# Patient Record
Sex: Female | Born: 1985 | Race: White | Hispanic: No | Smoking: Former smoker
Health system: Southern US, Community
[De-identification: ages and names within clinical notes are randomized; demographics above are authoritative.]

## PROBLEM LIST (undated history)

## (undated) DIAGNOSIS — K5150 Left sided colitis without complications: Secondary | ICD-10-CM

## (undated) DIAGNOSIS — F32A Depression, unspecified: Secondary | ICD-10-CM

## (undated) DIAGNOSIS — F419 Anxiety disorder, unspecified: Secondary | ICD-10-CM

## (undated) DIAGNOSIS — F1910 Other psychoactive substance abuse, uncomplicated: Secondary | ICD-10-CM

## (undated) DIAGNOSIS — K529 Noninfective gastroenteritis and colitis, unspecified: Secondary | ICD-10-CM

## (undated) DIAGNOSIS — F329 Major depressive disorder, single episode, unspecified: Secondary | ICD-10-CM

## (undated) DIAGNOSIS — B1920 Unspecified viral hepatitis C without hepatic coma: Secondary | ICD-10-CM

## (undated) HISTORY — DX: Depression, unspecified: F32.A

## (undated) HISTORY — DX: Left sided colitis without complications: K51.50

## (undated) HISTORY — DX: Anxiety disorder, unspecified: F41.9

## (undated) HISTORY — DX: Major depressive disorder, single episode, unspecified: F32.9

---

## 2014-02-24 ENCOUNTER — Encounter (HOSPITAL_COMMUNITY): Payer: Self-pay | Admitting: *Deleted

## 2014-02-24 ENCOUNTER — Emergency Department (HOSPITAL_COMMUNITY)
Admission: EM | Admit: 2014-02-24 | Discharge: 2014-02-24 | Disposition: A | Discharge status: Home / Self Care | Payer: No Typology Code available for payment source | Attending: Emergency Medicine | Admitting: Emergency Medicine

## 2014-02-24 ENCOUNTER — Emergency Department (HOSPITAL_COMMUNITY): Payer: No Typology Code available for payment source

## 2014-02-24 DIAGNOSIS — S99922A Unspecified injury of left foot, initial encounter: Secondary | ICD-10-CM | POA: Diagnosis present

## 2014-02-24 DIAGNOSIS — Y92410 Unspecified street and highway as the place of occurrence of the external cause: Secondary | ICD-10-CM | POA: Insufficient documentation

## 2014-02-24 DIAGNOSIS — Z3202 Encounter for pregnancy test, result negative: Secondary | ICD-10-CM | POA: Diagnosis not present

## 2014-02-24 DIAGNOSIS — S199XXA Unspecified injury of neck, initial encounter: Secondary | ICD-10-CM | POA: Insufficient documentation

## 2014-02-24 DIAGNOSIS — R451 Restlessness and agitation: Secondary | ICD-10-CM | POA: Diagnosis not present

## 2014-02-24 DIAGNOSIS — Y9389 Activity, other specified: Secondary | ICD-10-CM | POA: Insufficient documentation

## 2014-02-24 DIAGNOSIS — Z720 Tobacco use: Secondary | ICD-10-CM | POA: Diagnosis not present

## 2014-02-24 DIAGNOSIS — S91312A Laceration without foreign body, left foot, initial encounter: Secondary | ICD-10-CM | POA: Diagnosis not present

## 2014-02-24 DIAGNOSIS — S91112A Laceration without foreign body of left great toe without damage to nail, initial encounter: Secondary | ICD-10-CM | POA: Insufficient documentation

## 2014-02-24 DIAGNOSIS — F1910 Other psychoactive substance abuse, uncomplicated: Secondary | ICD-10-CM

## 2014-02-24 DIAGNOSIS — T148XXA Other injury of unspecified body region, initial encounter: Secondary | ICD-10-CM

## 2014-02-24 DIAGNOSIS — Y998 Other external cause status: Secondary | ICD-10-CM | POA: Insufficient documentation

## 2014-02-24 HISTORY — DX: Other psychoactive substance abuse, uncomplicated: F19.10

## 2014-02-24 LAB — CBC WITH DIFFERENTIAL/PLATELET
BASOS PCT: 0 % (ref 0–1)
Basophils Absolute: 0 10*3/uL (ref 0.0–0.1)
Eosinophils Absolute: 0 10*3/uL (ref 0.0–0.7)
Eosinophils Relative: 0 % (ref 0–5)
HCT: 40.9 % (ref 36.0–46.0)
HEMOGLOBIN: 14.1 g/dL (ref 12.0–15.0)
LYMPHS PCT: 18 % (ref 12–46)
Lymphs Abs: 3 10*3/uL (ref 0.7–4.0)
MCH: 32.6 pg (ref 26.0–34.0)
MCHC: 34.5 g/dL (ref 30.0–36.0)
MCV: 94.5 fL (ref 78.0–100.0)
MONOS PCT: 7 % (ref 3–12)
Monocytes Absolute: 1.1 10*3/uL — ABNORMAL HIGH (ref 0.1–1.0)
Neutro Abs: 12.3 10*3/uL — ABNORMAL HIGH (ref 1.7–7.7)
Neutrophils Relative %: 75 % (ref 43–77)
Platelets: 339 10*3/uL (ref 150–400)
RBC: 4.33 MIL/uL (ref 3.87–5.11)
RDW: 12.5 % (ref 11.5–15.5)
WBC: 16.5 10*3/uL — ABNORMAL HIGH (ref 4.0–10.5)

## 2014-02-24 LAB — RAPID URINE DRUG SCREEN, HOSP PERFORMED
AMPHETAMINES: POSITIVE — AB
BARBITURATES: POSITIVE — AB
Benzodiazepines: POSITIVE — AB
COCAINE: NOT DETECTED
OPIATES: NOT DETECTED
TETRAHYDROCANNABINOL: POSITIVE — AB

## 2014-02-24 LAB — ETHANOL

## 2014-02-24 LAB — I-STAT CHEM 8, ED
BUN: 23 mg/dL (ref 6–23)
Calcium, Ion: 1.13 mmol/L (ref 1.12–1.23)
Chloride: 105 mEq/L (ref 96–112)
Creatinine, Ser: 0.7 mg/dL (ref 0.50–1.10)
GLUCOSE: 98 mg/dL (ref 70–99)
HCT: 43 % (ref 36.0–46.0)
Hemoglobin: 14.6 g/dL (ref 12.0–15.0)
POTASSIUM: 4.4 mmol/L (ref 3.5–5.1)
Sodium: 139 mmol/L (ref 135–145)
TCO2: 23 mmol/L (ref 0–100)

## 2014-02-24 LAB — POC URINE PREG, ED: PREG TEST UR: NEGATIVE

## 2014-02-24 MED ORDER — KETOROLAC TROMETHAMINE 60 MG/2ML IM SOLN
60.0000 mg | Freq: Once | INTRAMUSCULAR | Status: AC
  Administered 2014-02-24: 60 mg via INTRAMUSCULAR
  Filled 2014-02-24: qty 2

## 2014-02-24 MED ORDER — MELOXICAM 7.5 MG PO TABS: 7.5000 mg | ORAL_TABLET | Freq: Every day | ORAL | Status: DC

## 2014-02-24 MED ORDER — OXYCODONE-ACETAMINOPHEN 5-325 MG PO TABS
1.0000 | ORAL_TABLET | Freq: Once | ORAL | Status: AC
  Administered 2014-02-24: 1 via ORAL
  Filled 2014-02-24: qty 1

## 2014-02-24 MED ORDER — CIPROFLOXACIN HCL 500 MG PO TABS
500.0000 mg | ORAL_TABLET | Freq: Once | ORAL | Status: AC
  Administered 2014-02-24: 500 mg via ORAL
  Filled 2014-02-24: qty 1

## 2014-02-24 MED ORDER — CIPROFLOXACIN HCL 500 MG PO TABS: 500.0000 mg | ORAL_TABLET | Freq: Two times a day (BID) | ORAL | Status: DC

## 2014-02-24 MED ORDER — SODIUM CHLORIDE 0.9 % IV BOLUS (SEPSIS): 1000.0000 mL | Freq: Once | INTRAVENOUS | Status: DC

## 2014-02-24 MED ORDER — LIDOCAINE-EPINEPHRINE 2 %-1:100000 IJ SOLN
INTRAMUSCULAR | Status: AC
  Filled 2014-02-24: qty 1

## 2014-02-24 MED ORDER — HYDROGEN PEROXIDE 3 % EX SOLN
CUTANEOUS | Status: AC
  Filled 2014-02-24: qty 473

## 2014-02-24 MED ORDER — LIDOCAINE HCL 2 % EX GEL
CUTANEOUS | Status: AC
  Filled 2014-02-24: qty 10

## 2014-02-24 MED ORDER — IOHEXOL 300 MG/ML  SOLN
100.0000 mL | Freq: Once | INTRAMUSCULAR | Status: AC | PRN
  Administered 2014-02-24: 100 mL via INTRAVENOUS

## 2014-02-24 NOTE — ED Notes (Signed)
Patient repeatedly asks staff to remove C-collar.  Unable to redirect her. She removed it herself against staff's advice.
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
## 2014-02-24 NOTE — ED Notes (Signed)
Patient transported to X-ray 

## 2014-02-24 NOTE — Discharge Instructions (Signed)
Contusion °A contusion is a deep bruise. Contusions happen when an injury causes bleeding under the skin. Signs of bruising include pain, puffiness (swelling), and discolored skin. The contusion may turn blue, purple, or yellow. °HOME CARE  °· Put ice on the injured area. °¨ Put ice in a plastic bag. °¨ Place a towel between your skin and the bag. °¨ Leave the ice on for 15-20 minutes, 03-04 times a day. °· Only take medicine as told by your doctor. °· Rest the injured area. °· If possible, raise (elevate) the injured area to lessen puffiness. °GET HELP RIGHT AWAY IF:  °· You have more bruising or puffiness. °· You have pain that is getting worse. °· Your puffiness or pain is not helped by medicine. °MAKE SURE YOU:  °· Understand these instructions. °· Will watch your condition. °· Will get help right away if you are not doing well or get worse. °Document Released: 08/01/2007 Document Revised: 05/07/2011 Document Reviewed: 12/18/2010 °ExitCare® Patient Information ©2015 [employer]. This information is not intended to replace advice given to you by your health care provider. Make sure you discuss any questions you have with your health care provider. ° °

## 2014-02-24 NOTE — ED Notes (Signed)
Advised pt not to ambulate on her L foot d/t lac on her toe.  Pt has returned at the desk x 3 to use the phone.
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
## 2014-02-24 NOTE — ED Notes (Signed)
Patient transported to CT 

## 2014-11-27 DIAGNOSIS — K529 Noninfective gastroenteritis and colitis, unspecified: Secondary | ICD-10-CM

## 2014-11-27 HISTORY — DX: Noninfective gastroenteritis and colitis, unspecified: K52.9

## 2014-12-05 ENCOUNTER — Encounter (HOSPITAL_BASED_OUTPATIENT_CLINIC_OR_DEPARTMENT_OTHER): Payer: Self-pay | Admitting: Emergency Medicine

## 2014-12-05 ENCOUNTER — Emergency Department (HOSPITAL_BASED_OUTPATIENT_CLINIC_OR_DEPARTMENT_OTHER)
Admission: EM | Admit: 2014-12-05 | Discharge: 2014-12-05 | Disposition: A | Discharge status: Home / Self Care | Payer: Self-pay | Attending: Emergency Medicine | Admitting: Emergency Medicine

## 2014-12-05 DIAGNOSIS — M545 Low back pain: Secondary | ICD-10-CM | POA: Insufficient documentation

## 2014-12-05 DIAGNOSIS — Z720 Tobacco use: Secondary | ICD-10-CM | POA: Insufficient documentation

## 2014-12-05 DIAGNOSIS — R109 Unspecified abdominal pain: Secondary | ICD-10-CM | POA: Insufficient documentation

## 2014-12-05 DIAGNOSIS — R110 Nausea: Secondary | ICD-10-CM | POA: Insufficient documentation

## 2014-12-05 DIAGNOSIS — R000 Tachycardia, unspecified: Secondary | ICD-10-CM | POA: Insufficient documentation

## 2014-12-05 DIAGNOSIS — R3915 Urgency of urination: Secondary | ICD-10-CM | POA: Insufficient documentation

## 2014-12-05 DIAGNOSIS — R197 Diarrhea, unspecified: Secondary | ICD-10-CM | POA: Insufficient documentation

## 2014-12-05 DIAGNOSIS — R509 Fever, unspecified: Secondary | ICD-10-CM | POA: Insufficient documentation

## 2014-12-05 DIAGNOSIS — R339 Retention of urine, unspecified: Secondary | ICD-10-CM | POA: Insufficient documentation

## 2014-12-05 LAB — CBC
HCT: 41.3 % (ref 36.0–46.0)
Hemoglobin: 14 g/dL (ref 12.0–15.0)
MCH: 32.3 pg (ref 26.0–34.0)
MCHC: 33.9 g/dL (ref 30.0–36.0)
MCV: 95.4 fL (ref 78.0–100.0)
Platelets: 273 10*3/uL (ref 150–400)
RBC: 4.33 MIL/uL (ref 3.87–5.11)
RDW: 12.7 % (ref 11.5–15.5)
WBC: 8.9 10*3/uL (ref 4.0–10.5)

## 2014-12-05 LAB — COMPREHENSIVE METABOLIC PANEL
ALT: 59 U/L — ABNORMAL HIGH (ref 14–54)
AST: 20 U/L (ref 15–41)
Albumin: 3.5 g/dL (ref 3.5–5.0)
Alkaline Phosphatase: 53 U/L (ref 38–126)
Anion gap: 8 (ref 5–15)
BUN: 11 mg/dL (ref 6–20)
CO2: 24 mmol/L (ref 22–32)
Calcium: 8.9 mg/dL (ref 8.9–10.3)
Chloride: 103 mmol/L (ref 101–111)
Creatinine, Ser: 0.54 mg/dL (ref 0.44–1.00)
GFR calc Af Amer: >60 mL/min (ref >60)
GFR calc non Af Amer: >60 mL/min (ref >60)
Glucose, Bld: 95 mg/dL (ref 65–99)
Potassium: 3.6 mmol/L (ref 3.5–5.1)
Sodium: 135 mmol/L (ref 135–145)
Total Bilirubin: 0.7 mg/dL (ref 0.3–1.2)
Total Protein: 7.4 g/dL (ref 6.5–8.1)

## 2014-12-05 LAB — LIPASE, BLOOD: Lipase: 19 U/L — ABNORMAL LOW (ref 22–51)

## 2014-12-05 LAB — URINALYSIS, ROUTINE W REFLEX MICROSCOPIC
Glucose, UA: NEGATIVE mg/dL
Ketones, ur: >80 mg/dL — AB
Nitrite: NEGATIVE
Protein, ur: 30 mg/dL — AB
Specific Gravity, Urine: 1.031 — ABNORMAL HIGH (ref 1.005–1.030)
Urobilinogen, UA: 1 mg/dL (ref 0.0–1.0)
pH: 6 (ref 5.0–8.0)

## 2014-12-05 LAB — URINE MICROSCOPIC-ADD ON

## 2014-12-05 MED ORDER — ONDANSETRON HCL 4 MG/2ML IJ SOLN: 4.0000 mg | Freq: Once | INTRAMUSCULAR | Status: DC

## 2014-12-05 MED ORDER — ONDANSETRON HCL 4 MG/2ML IJ SOLN
4.0000 mg | Freq: Once | INTRAMUSCULAR | Status: AC | PRN
  Administered 2014-12-05: 4 mg via INTRAVENOUS
  Filled 2014-12-05: qty 2

## 2014-12-05 MED ORDER — DICYCLOMINE HCL 10 MG PO CAPS
10.0000 mg | ORAL_CAPSULE | Freq: Once | ORAL | Status: AC
  Administered 2014-12-05: 10 mg via ORAL
  Filled 2014-12-05: qty 1

## 2014-12-05 MED ORDER — ACETAMINOPHEN 325 MG PO TABS
650.0000 mg | ORAL_TABLET | Freq: Once | ORAL | Status: AC
  Administered 2014-12-05: 650 mg via ORAL
  Filled 2014-12-05: qty 2

## 2014-12-05 MED ORDER — KETOROLAC TROMETHAMINE 15 MG/ML IJ SOLN
15.0000 mg | Freq: Once | INTRAMUSCULAR | Status: AC
  Administered 2014-12-05: 15 mg via INTRAVENOUS
  Filled 2014-12-05: qty 1

## 2014-12-05 MED ORDER — LEVOFLOXACIN 500 MG PO TABS: 500.0000 mg | ORAL_TABLET | Freq: Every day | ORAL | Status: DC

## 2014-12-05 MED ORDER — SODIUM CHLORIDE 0.9 % IV BOLUS (SEPSIS)
1000.0000 mL | Freq: Once | INTRAVENOUS | Status: AC
  Administered 2014-12-05: 1000 mL via INTRAVENOUS

## 2014-12-05 MED ORDER — LEVOFLOXACIN 500 MG PO TABS
500.0000 mg | ORAL_TABLET | Freq: Once | ORAL | Status: AC
  Administered 2014-12-05: 500 mg via ORAL
  Filled 2014-12-05: qty 1

## 2014-12-05 NOTE — Discharge Instructions (Signed)
Diarrhea Diarrhea is frequent loose and watery bowel movements. It can cause you to feel weak and dehydrated. Dehydration can cause you to become tired and thirsty, have a dry mouth, and have decreased urination that often is dark yellow. Diarrhea is a sign of another problem, most often an infection that will not last long. In most cases, diarrhea typically lasts 2-3 days. However, it can last longer if it is a sign of something more serious. It is important to treat your diarrhea as directed by your caregiver to lessen or prevent future episodes of diarrhea. CAUSES  Some common causes include:  Gastrointestinal infections caused by viruses, bacteria, or parasites.  Food poisoning or food allergies.  Certain medicines, such as antibiotics, chemotherapy, and laxatives.  Artificial sweeteners and fructose.  Digestive disorders. HOME CARE INSTRUCTIONS  Ensure adequate fluid intake (hydration): Have 1 cup (8 oz) of fluid for each diarrhea episode. Avoid fluids that contain simple sugars or sports drinks, fruit juices, whole milk products, and sodas. Your urine should be clear or pale yellow if you are drinking enough fluids. Hydrate with an oral rehydration solution that you can purchase at pharmacies, retail stores, and online. You can prepare an oral rehydration solution at home by mixing the following ingredients together:   - tsp table salt.   tsp baking soda.   tsp salt substitute containing potassium chloride.  1  tablespoons sugar.  1 L (34 oz) of water.  Certain foods and beverages may increase the speed at which food moves through the gastrointestinal (GI) tract. These foods and beverages should be avoided and include:  Caffeinated and alcoholic beverages.  High-fiber foods, such as raw fruits and vegetables, nuts, seeds, and whole grain breads and cereals.  Foods and beverages sweetened with sugar alcohols, such as xylitol, sorbitol, and mannitol.  Some foods may be well  tolerated and may help thicken stool including:  Starchy foods, such as rice, toast, pasta, low-sugar cereal, oatmeal, grits, baked potatoes, crackers, and bagels.  Bananas.  Applesauce.  Add probiotic-rich foods to help increase healthy bacteria in the GI tract, such as yogurt and fermented milk products.  Wash your hands well after each diarrhea episode.  Only take over-the-counter or prescription medicines as directed by your caregiver.  Take a warm bath to relieve any burning or pain from frequent diarrhea episodes. SEEK IMMEDIATE MEDICAL CARE IF:   You are unable to keep fluids down.  You have persistent vomiting.  You have blood in your stool, or your stools are black and tarry.  You do not urinate in 6-8 hours, or there is only a small amount of very dark urine.  You have abdominal pain that increases or localizes.  You have weakness, dizziness, confusion, or light-headedness.  You have a severe headache.  Your diarrhea gets worse or does not get better.  You have a fever or persistent symptoms for more than 2-3 days.  You have a fever and your symptoms suddenly get worse. MAKE SURE YOU:   Understand these instructions.  Will watch your condition.  Will get help right away if you are not doing well or get worse.   This information is not intended to replace advice given to you by your health care provider. Make sure you discuss any questions you have with your health care provider.   Document Released: 02/02/2002 Document Revised: 03/05/2014 Document Reviewed: 10/21/2011 Elsevier Interactive Patient Education 2016 Elsevier Inc.  Probiotics WHAT ARE PROBIOTICS? Probiotics are the good bacteria and yeasts  that live in your body and keep you and your digestive system healthy. Probiotics also help your body's defense (immune) system and protect your body against bad bacterial growth.  Certain foods contain probiotics, such as yogurt. Probiotics can also be  purchased as a supplement. As with any supplement or drug, it is important to discuss its use with your health care provider.  WHAT AFFECTS THE BALANCE OF BACTERIA IN MY BODY? The balance of bacteria in your body can be affected by:   Antibiotic medicines. Antibiotics are sometimes necessary to treat infection. Unfortunately, they may kill good or friendly bacteria in your body as well as the bad bacteria. This may lead to stomach problems like diarrhea, gas, and cramping.  Disease. Some conditions are the result of an overgrowth of bad bacteria, yeasts, parasites, or fungi. These conditions include:   Infectious diarrhea.  Stomach and respiratory infections.  Skin infections.  Irritable bowel syndrome (IBS).  Inflammatory bowel diseases.  Ulcer due to Helicobacter pylori (H. pylori) infection.  Tooth decay and periodontal disease.  Vaginal infections. Stress and poor diet may also lower the good bacteria in your body.  WHAT TYPE OF PROBIOTIC IS RIGHT FOR ME? Probiotics are available over the counter at your local pharmacy, health food, or grocery store. They come in many different forms, combinations of strains, and dosing strengths. Some may need to be refrigerated. Always read the label for storage and usage instructions. Specific strains have been shown to be more effective for certain conditions. Ask your health care provider what option is best for you.  WHY WOULD I NEED PROBIOTICS? There are many reasons your health care provider might recommend a probiotic supplement, including:   Diarrhea.  Constipation.  IBS.  Respiratory infections.  Yeast infections.  Acne, eczema, and other skin conditions.  Frequent urinary tract infections (UTIs). ARE THERE SIDE EFFECTS OF PROBIOTICS? Some people experience mild side effects when taking probiotics. Side effects are usually temporary and may include:   Gas.  Bloating.  Cramping. Rarely, serious side effects, such as  infection or immune system changes, may occur. WHAT ELSE DO I NEED TO KNOW ABOUT PROBIOTICS?   There are many different strains of probiotics. Certain strains may be more effective depending on your condition. Probiotics are available in varying doses. Ask your health care provider which probiotic you should use and how often.   If you are taking probiotics along with antibiotics, it is generally recommended to wait at least 2 hours between taking the antibiotic and taking the probiotic.  FOR MORE INFORMATION:  [facility] [email or web address]   This information is not intended to replace advice given to you by your health care provider. Make sure you discuss any questions you have with your health care provider.   Document Released: 09/09/2013 Document Reviewed: 09/09/2013 Elsevier Interactive Patient Education [employer].

## 2014-12-05 NOTE — ED Notes (Signed)
Attempted to obtain stool sample, Patient is unable to try at this time. "Patient states is might be awhile before she is able to." RN [place] aware

## 2014-12-05 NOTE — ED Provider Notes (Signed)
CSN: [id number]     Arrival date & time 12/05/14  1807 History  By signing my name below, I, Helane Gunther, attest that this documentation has been prepared under the direction and in the presence of Virgel Manifold, MD. Electronically Signed: Helane Gunther, ED Scribe. 12/05/2014. 7:07 PM.     Chief Complaint  Patient presents with  . Blood In Stools  . Urinary Retention   The history is provided by the patient. No language interpreter was used.   HPI Comments: Nicole Reeves is a 29 y.o. female who presents to the Emergency Department complaining of bloody diarrhea onset 6 days ago. She reports associated subjective fever, chills, generalized myalgias, abdominal pain, lower back pain, nausea, loss of appetite, and urgency without being able to void any urine. She notes she has not been drinking much the past few days. She denies recent travel out of the country or any sick contacts. She denies using well-water. She denies having taken antibiotics recently. Pt denies vomiting and sore throat.  History reviewed. No pertinent past medical history. History reviewed. No pertinent past surgical history. History reviewed. No pertinent family history. Social History  Substance Use Topics  . Smoking status: Current Every Day Smoker -- 1.00 packs/day    Types: Cigarettes  . Smokeless tobacco: None  . Alcohol Use: Yes   OB History    No data available     Review of Systems  Constitutional: Positive for fever (subjective), chills and appetite change.  Gastrointestinal: Positive for nausea, abdominal pain, diarrhea and blood in stool. Negative for vomiting.  Genitourinary: Positive for urgency.  Musculoskeletal: Positive for myalgias and back pain.  All other systems reviewed and are negative.   Allergies  Review of patient's allergies indicates no known allergies.  Home Medications   Prior to Admission medications   Medication Sig Start Date End Date Taking? Authorizing Provider   ibuprofen (ADVIL,MOTRIN) 200 MG tablet Take 800 mg by mouth every 6 (six) hours as needed (for pain.).   Yes Historical Provider, MD  Loperamide HCl (IMODIUM PO) Take by mouth.   Yes Historical Provider, MD   BP 128/89 mmHg  Pulse 103  Temp(Src) 100.2 F (37.9 C) (Oral)  Resp 18  Ht 5' 7"  (1.702 m)  Wt 155 lb (70.308 kg)  BMI 24.27 kg/m2  SpO2 98%  LMP 11/14/2014 Physical Exam  Constitutional: She is oriented to person, place, and time. She appears well-developed and well-nourished.  HENT:  Head: Normocephalic.  Eyes: EOM are normal.  Neck: Normal range of motion.  Cardiovascular: Regular rhythm.   Mildly tachycardic  Pulmonary/Chest: Effort normal.  Abdominal: Soft. She exhibits no distension. There is no tenderness.  Musculoskeletal: Normal range of motion.  Neurological: She is alert and oriented to person, place, and time.  Psychiatric: She has a normal mood and affect.  Nursing note and vitals reviewed.   ED Course  Procedures  DIAGNOSTIC STUDIES: Oxygen Saturation is 100% on RA, normal by my interpretation.    COORDINATION OF CARE: 7:04 PM - Discussed plans to order diagnostic studies. Pt advised of plan for treatment and pt agrees.  Labs Review Labs Reviewed  URINALYSIS, ROUTINE W REFLEX MICROSCOPIC (NOT AT [facility]) - Abnormal; Notable for the following:    Color, Urine ORANGE (*)    APPearance TURBID (*)    Specific Gravity, Urine 1.031 (*)    Hgb urine dipstick SMALL (*)    Bilirubin Urine SMALL (*)    Ketones, ur >80 (*)  Protein, ur 30 (*)    Leukocytes, UA MODERATE (*)    All other components within normal limits  URINE MICROSCOPIC-ADD ON - Abnormal; Notable for the following:    Squamous Epithelial / LPF MANY (*)    Bacteria, UA MANY (*)    All other components within normal limits  LIPASE, BLOOD - Abnormal; Notable for the following:    Lipase 19 (*)    All other components within normal limits  COMPREHENSIVE METABOLIC PANEL - Abnormal; Notable  for the following:    ALT 59 (*)    All other components within normal limits  CBC  URINALYSIS, ROUTINE W REFLEX MICROSCOPIC (NOT AT [facility])  GI PATHOGEN PANEL BY PCR, STOOL    Imaging Review No results found. I have personally reviewed and evaluated these images and lab results as part of my medical decision-making.   EKG Interpretation None      MDM   Final diagnoses:  Bloody diarrhea    29yF with bloody diarrhea. Febrile. Some abdominal pain, but benign abdominal exam. Could not provide stool sample. Empiric levaquin. Return precautions discussed.   I personally preformed the services scribed in my presence. The recorded information has been reviewed is accurate. Virgel Manifold, MD.   Virgel Manifold, MD 12/09/14 [phone]

## 2014-12-05 NOTE — ED Notes (Signed)
Pt reports fever, chills, diarrhea x 3 days, now with bright red blood in stool

## 2014-12-07 ENCOUNTER — Inpatient Hospital Stay (HOSPITAL_BASED_OUTPATIENT_CLINIC_OR_DEPARTMENT_OTHER)
Admission: EM | Admit: 2014-12-07 | Discharge: 2014-12-13 | DRG: 386 | Disposition: A | Discharge status: Home / Self Care | Payer: Self-pay | Attending: Internal Medicine | Admitting: Internal Medicine

## 2014-12-07 ENCOUNTER — Emergency Department (HOSPITAL_BASED_OUTPATIENT_CLINIC_OR_DEPARTMENT_OTHER): Payer: Self-pay

## 2014-12-07 ENCOUNTER — Encounter (HOSPITAL_BASED_OUTPATIENT_CLINIC_OR_DEPARTMENT_OTHER): Payer: Self-pay | Admitting: *Deleted

## 2014-12-07 DIAGNOSIS — K529 Noninfective gastroenteritis and colitis, unspecified: Secondary | ICD-10-CM | POA: Diagnosis present

## 2014-12-07 DIAGNOSIS — F17210 Nicotine dependence, cigarettes, uncomplicated: Secondary | ICD-10-CM | POA: Diagnosis present

## 2014-12-07 DIAGNOSIS — E860 Dehydration: Secondary | ICD-10-CM | POA: Diagnosis present

## 2014-12-07 DIAGNOSIS — R197 Diarrhea, unspecified: Secondary | ICD-10-CM | POA: Diagnosis present

## 2014-12-07 DIAGNOSIS — E871 Hypo-osmolality and hyponatremia: Secondary | ICD-10-CM | POA: Diagnosis present

## 2014-12-07 DIAGNOSIS — K921 Melena: Secondary | ICD-10-CM | POA: Diagnosis present

## 2014-12-07 DIAGNOSIS — K5190 Ulcerative colitis, unspecified, without complications: Principal | ICD-10-CM | POA: Diagnosis present

## 2014-12-07 DIAGNOSIS — E872 Acidosis: Secondary | ICD-10-CM | POA: Diagnosis present

## 2014-12-07 DIAGNOSIS — E876 Hypokalemia: Secondary | ICD-10-CM | POA: Diagnosis present

## 2014-12-07 HISTORY — DX: Other psychoactive substance abuse, uncomplicated: F19.10

## 2014-12-07 HISTORY — DX: Noninfective gastroenteritis and colitis, unspecified: K52.9

## 2014-12-07 LAB — HEPATIC FUNCTION PANEL
ALBUMIN: 3.4 g/dL — AB (ref 3.5–5.0)
ALK PHOS: 73 U/L (ref 38–126)
ALT: 43 U/L (ref 14–54)
AST: 20 U/L (ref 15–41)
BILIRUBIN TOTAL: 0.9 mg/dL (ref 0.3–1.2)
Bilirubin, Direct: 0.1 mg/dL (ref 0.1–0.5)
Indirect Bilirubin: 0.8 mg/dL (ref 0.3–0.9)
Total Protein: 8.1 g/dL (ref 6.5–8.1)

## 2014-12-07 LAB — CBC WITH DIFFERENTIAL/PLATELET
BAND NEUTROPHILS: 4 %
Basophils Absolute: 0 10*3/uL (ref 0.0–0.1)
Basophils Relative: 0 %
EOS PCT: 0 %
Eosinophils Absolute: 0 10*3/uL (ref 0.0–0.7)
HEMATOCRIT: 39 % (ref 36.0–46.0)
Hemoglobin: 13.4 g/dL (ref 12.0–15.0)
Lymphocytes Relative: 16 %
Lymphs Abs: 1.5 10*3/uL (ref 0.7–4.0)
MCH: 32.4 pg (ref 26.0–34.0)
MCHC: 34.4 g/dL (ref 30.0–36.0)
MCV: 94.2 fL (ref 78.0–100.0)
MONOS PCT: 14 %
Monocytes Absolute: 1.3 10*3/uL — ABNORMAL HIGH (ref 0.1–1.0)
NEUTROS ABS: 6.7 10*3/uL (ref 1.7–7.7)
Neutrophils Relative %: 66 %
Platelets: 332 10*3/uL (ref 150–400)
RBC: 4.14 MIL/uL (ref 3.87–5.11)
RDW: 12.3 % (ref 11.5–15.5)
WBC: 9.5 10*3/uL (ref 4.0–10.5)

## 2014-12-07 LAB — BASIC METABOLIC PANEL
ANION GAP: 13 (ref 5–15)
BUN: 6 mg/dL (ref 6–20)
CALCIUM: 8.8 mg/dL — AB (ref 8.9–10.3)
CO2: 15 mmol/L — AB (ref 22–32)
CREATININE: 0.54 mg/dL (ref 0.44–1.00)
Chloride: 104 mmol/L (ref 101–111)
GFR calc Af Amer: >60 mL/min (ref >60)
GFR calc non Af Amer: >60 mL/min (ref >60)
GLUCOSE: 77 mg/dL (ref 65–99)
Potassium: 3.7 mmol/L (ref 3.5–5.1)
Sodium: 132 mmol/L — ABNORMAL LOW (ref 135–145)

## 2014-12-07 LAB — URINE CULTURE: Culture: NO GROWTH

## 2014-12-07 LAB — LIPASE, BLOOD: LIPASE: 29 U/L (ref 22–51)

## 2014-12-07 LAB — HCG, SERUM, QUALITATIVE: Preg, Serum: NEGATIVE

## 2014-12-07 MED ORDER — ACETAMINOPHEN 650 MG RE SUPP: 650.0000 mg | Freq: Four times a day (QID) | RECTAL | Status: DC | PRN

## 2014-12-07 MED ORDER — IOHEXOL 300 MG/ML  SOLN
25.0000 mL | Freq: Once | INTRAMUSCULAR | Status: AC | PRN
  Administered 2014-12-07: 25 mL via ORAL

## 2014-12-07 MED ORDER — ONDANSETRON HCL 4 MG/2ML IJ SOLN
4.0000 mg | Freq: Once | INTRAMUSCULAR | Status: AC
  Administered 2014-12-07: 4 mg via INTRAVENOUS
  Filled 2014-12-07: qty 2

## 2014-12-07 MED ORDER — ACETAMINOPHEN 325 MG PO TABS
650.0000 mg | ORAL_TABLET | Freq: Four times a day (QID) | ORAL | Status: DC | PRN
  Administered 2014-12-09 – 2014-12-13 (×2): 650 mg via ORAL
  Filled 2014-12-07 (×2): qty 2

## 2014-12-07 MED ORDER — SODIUM CHLORIDE 0.9 % IV BOLUS (SEPSIS)
1000.0000 mL | Freq: Once | INTRAVENOUS | Status: AC
  Administered 2014-12-07: 1000 mL via INTRAVENOUS

## 2014-12-07 MED ORDER — ONDANSETRON HCL 4 MG PO TABS
4.0000 mg | ORAL_TABLET | Freq: Four times a day (QID) | ORAL | Status: DC | PRN
Start: 2014-12-07 — End: 2014-12-13

## 2014-12-07 MED ORDER — FOLIC ACID 1 MG PO TABS
1.0000 mg | ORAL_TABLET | Freq: Every day | ORAL | Status: DC
  Administered 2014-12-08 – 2014-12-13 (×6): 1 mg via ORAL
  Filled 2014-12-07 (×6): qty 1

## 2014-12-07 MED ORDER — HYDROMORPHONE HCL 1 MG/ML IJ SOLN
1.0000 mg | INTRAMUSCULAR | Status: AC | PRN
  Administered 2014-12-08 (×3): 1 mg via INTRAVENOUS
  Filled 2014-12-07 (×3): qty 1

## 2014-12-07 MED ORDER — SODIUM CHLORIDE 0.9 % IV SOLN
INTRAVENOUS | Status: AC
  Administered 2014-12-07: 23:00:00 via INTRAVENOUS

## 2014-12-07 MED ORDER — MORPHINE SULFATE (PF) 4 MG/ML IV SOLN
4.0000 mg | Freq: Once | INTRAVENOUS | Status: AC
  Administered 2014-12-07: 4 mg via INTRAVENOUS
  Filled 2014-12-07: qty 1

## 2014-12-07 MED ORDER — IOHEXOL 300 MG/ML  SOLN
100.0000 mL | Freq: Once | INTRAMUSCULAR | Status: AC | PRN
  Administered 2014-12-07: 100 mL via INTRAVENOUS

## 2014-12-07 MED ORDER — VITAMIN B-1 100 MG PO TABS
100.0000 mg | ORAL_TABLET | Freq: Every day | ORAL | Status: DC
  Administered 2014-12-08 – 2014-12-13 (×6): 100 mg via ORAL
  Filled 2014-12-07 (×6): qty 1

## 2014-12-07 MED ORDER — HEPARIN SODIUM (PORCINE) 5000 UNIT/ML IJ SOLN
5000.0000 [IU] | Freq: Three times a day (TID) | INTRAMUSCULAR | Status: DC
  Administered 2014-12-08 – 2014-12-13 (×16): 5000 [IU] via SUBCUTANEOUS
  Filled 2014-12-07 (×19): qty 1

## 2014-12-07 MED ORDER — SODIUM CHLORIDE 0.9 % IV SOLN
INTRAVENOUS | Status: DC
  Administered 2014-12-08 – 2014-12-09 (×3): via INTRAVENOUS

## 2014-12-07 MED ORDER — ONDANSETRON HCL 4 MG/2ML IJ SOLN: 4.0000 mg | Freq: Three times a day (TID) | INTRAMUSCULAR | Status: DC | PRN

## 2014-12-07 MED ORDER — ONDANSETRON HCL 4 MG/2ML IJ SOLN
4.0000 mg | Freq: Four times a day (QID) | INTRAMUSCULAR | Status: DC | PRN
  Administered 2014-12-08: 4 mg via INTRAVENOUS
  Filled 2014-12-07: qty 2

## 2014-12-07 MED ORDER — ADULT MULTIVITAMIN W/MINERALS CH
1.0000 | ORAL_TABLET | Freq: Every day | ORAL | Status: DC
  Administered 2014-12-08 – 2014-12-13 (×6): 1 via ORAL
  Filled 2014-12-07 (×6): qty 1

## 2014-12-07 MED ORDER — METRONIDAZOLE IN NACL 5-0.79 MG/ML-% IV SOLN
500.0000 mg | Freq: Three times a day (TID) | INTRAVENOUS | Status: DC
  Administered 2014-12-08 – 2014-12-12 (×14): 500 mg via INTRAVENOUS
  Filled 2014-12-07 (×15): qty 100

## 2014-12-07 NOTE — ED Provider Notes (Signed)
CSN: [id number]     Arrival date & time 12/07/14  1515 History   First MD Initiated Contact with Patient 12/07/14 1601     Chief Complaint  Patient presents with  . Abdominal Pain     (Consider location/radiation/quality/duration/timing/severity/associated sxs/prior Treatment) HPI Comments: Patient is a 29 year old female with no significant past medical history. She presents for evaluation of a five-day history of abdominal cramping, nausea, and bloody diarrhea. She was seen here 3 days ago and treated with Levaquin. She has been on this for the past 3 days, however is not improving. She is actually worsening.  Patient is a 29 y.o. female presenting with abdominal pain. The history is provided by the patient.  Abdominal Pain Pain location:  Generalized Pain quality: cramping   Pain radiates to:  Does not radiate Pain severity:  Moderate Onset quality:  Gradual Duration:  5 days Timing:  Constant Progression:  Worsening Chronicity:  New Relieved by:  Nothing Worsened by:  Nothing tried   History reviewed. No pertinent past medical history. History reviewed. No pertinent past surgical history. No family history on file. Social History  Substance Use Topics  . Smoking status: Current Every Day Smoker -- 1.00 packs/day    Types: Cigarettes  . Smokeless tobacco: None  . Alcohol Use: Yes   OB History    No data available     Review of Systems  Gastrointestinal: Positive for abdominal pain.  All other systems reviewed and are negative.     Allergies  Review of patient's allergies indicates no known allergies.  Home Medications   Prior to Admission medications   Medication Sig Start Date End Date Taking? Authorizing Provider  ibuprofen (ADVIL,MOTRIN) 200 MG tablet Take 800 mg by mouth every 6 (six) hours as needed (for pain.).    Historical Provider, MD  levofloxacin (LEVAQUIN) 500 MG tablet Take 1 tablet (500 mg total) by mouth daily. 12/05/14   Virgel Manifold, MD   Loperamide HCl (IMODIUM PO) Take by mouth.    Historical Provider, MD   BP 129/67 mmHg  Pulse 96  Temp(Src) 99.1 F (37.3 C) (Oral)  Resp 18  Ht 5' 7"  (1.702 m)  Wt 155 lb (70.308 kg)  BMI 24.27 kg/m2  SpO2 97%  LMP 12/06/2014 Physical Exam  Constitutional: She is oriented to person, place, and time. She appears well-developed and well-nourished. No distress.  HENT:  Head: Normocephalic and atraumatic.  Neck: Normal range of motion. Neck supple.  Cardiovascular: Normal rate and regular rhythm.  Exam reveals no gallop and no friction rub.   No murmur heard. Pulmonary/Chest: Effort normal and breath sounds normal. No respiratory distress. She has no wheezes.  Abdominal: Soft. Bowel sounds are normal. She exhibits no distension. There is tenderness. There is no rebound and no guarding.  There is tenderness to palpation in all 4 quadrants  Musculoskeletal: Normal range of motion.  Neurological: She is alert and oriented to person, place, and time.  Skin: Skin is warm and dry. She is not diaphoretic.  Nursing note and vitals reviewed.   ED Course  Procedures (including critical care time) Labs Review Labs Reviewed  CBC WITH DIFFERENTIAL/PLATELET - Abnormal; Notable for the following:    Monocytes Absolute 1.3 (*)    All other components within normal limits  BASIC METABOLIC PANEL - Abnormal; Notable for the following:    Sodium 132 (*)    CO2 15 (*)    Calcium 8.8 (*)    All other components within normal  limits  HEPATIC FUNCTION PANEL - Abnormal; Notable for the following:    Albumin 3.4 (*)    All other components within normal limits  HCG, SERUM, QUALITATIVE  LIPASE, BLOOD    Imaging Review Ct Abdomen Pelvis W Contrast  12/07/2014   CLINICAL DATA:  Abdominal pain, treated for bacterial intestinal infection Sunday, worsening nausea and vomiting, diarrhea  EXAM: CT ABDOMEN AND PELVIS WITH CONTRAST  TECHNIQUE: Multidetector CT imaging of the abdomen and pelvis was  performed using the standard protocol following bolus administration of intravenous contrast.  CONTRAST:  11m OMNIPAQUE IOHEXOL 300 MG/ML SOLN, 108mOMNIPAQUE IOHEXOL 300 MG/ML SOLN  COMPARISON:  02/24/2014  FINDINGS: Lung bases are unremarkable. Sagittal images of the spine are unremarkable. Liver, spleen, pancreas and adrenal glands are unremarkable. Kidneys are symmetrical in size and enhancement. No hydronephrosis or hydroureter. No calcified gallstones are noted within gallbladder. No aortic aneurysm. No small bowel obstruction. No ascites or free air. No adenopathy. There is no pericecal inflammation. The terminal ileum is unremarkable. There is a low lying cecum. Normal appendix is partially visualized in coronal image 39.  There is abnormal thickening of colonic wall in splenic flexure of the colon, descending colon and sigmoid colon. Mild enhancement of mucosa in sigmoid colon. Some liquid stool noted within sigmoid colon. Findings are consistent with segmental colitis. Clinical correlation is necessary. There is no evidence of pericolonic abscess or perforation. No significant adenopathy. No free abdominal air.  IMPRESSION: 1. There is thickening of colonic wall up to 6 mm in splenic flexure of the colon, descending colon and sigmoid colon. Findings are consistent with long segment colitis. No pericolonic abscess or perforation. 2. There is a low lying cecum. Normal appendix. No pericecal inflammation. 3. No small bowel obstruction.  No free abdominal air. 4. No hydronephrosis or hydroureter.   Electronically Signed   By: LiLahoma Crocker.D.   On: 12/07/2014 18:20   I have personally reviewed and evaluated these images and lab results as part of my medical decision-making.   EKG Interpretation None      MDM   Final diagnoses:  Colitis    CT scan today reveals a colitis. Etiology is most likely either infectious or inflammatory. I've discussed this with Dr. StFuller Planrom GI who is recommending  admission to the hospital. Dr. PaPosey Prontorom the hospitalist service agrees to admit.    DoVeryl SpeakMD 12/07/14 2008

## 2014-12-07 NOTE — H&P (Addendum)
[facility] History and Physical  Jaycey Gens XEN:[id number] DOB: [birth year] DOA: 12/07/2014  Referring physician: Veryl Speak, MD PCP: No PCP Per Patient   Chief Complaint: Abdominal Pain  HPI: Nicole Reeves is a 29 y.o. female with no prior history presents with abdominal pain. Patient has been having pain in her abdomen for about 8 days now. She has had diarrhea and also has been having cramping. Patient states that there has been blood in her diarrhea. Patient was seen and started on oral levaquin for a presumed bacterial in addition she has been taking imodium without much improvement. She denies recent travel. She has had no sick contacts. She states there is associated nausea and had vomiting last night. No fevers are noted. She states that she works at a [employer] and has been eating there. No one has been sick there that she is aware of. She has never had this type of diarrhea before. She states that one of her aunts might have UC   Review of Systems:  Constitutional:  No weight loss, night sweats, Fevers, chills, fatigue.  HEENT:  No headaches, itching, ear ache, nasal congestion, post nasal drip,  Cardio-vascular:  No chest pain, Orthopnea, PND, +dizziness, palpitations  GI:  No heartburn, +indigestion, +abdominal pain, +nausea, +vomiting, +diarrhea, +loss of appetite  Resp:  No shortness of breath with exertion or at rest. No coughing up of blood.No change in color of mucus Skin:  no rash or lesions GU:  no dysuria, change in color of urine, no urgency or frequency.  Musculoskeletal:  No joint pain or swelling. No decreased range of motion.  Psych:  No change in mood or affect. No depression or anxiety.   History reviewed. No pertinent past medical history. History reviewed. No pertinent past surgical history. Social History:  reports that she has been smoking Cigarettes.  She has been smoking about 1.00 pack per day. She does not have any smokeless  tobacco history on file. She reports that she drinks alcohol. She reports that she uses illicit drugs (Marijuana).  No Known Allergies  No family history on file.   Prior to Admission medications   Medication Sig Start Date End Date Taking? Authorizing Provider  ibuprofen (ADVIL,MOTRIN) 200 MG tablet Take 800 mg by mouth every 6 (six) hours as needed (for pain.).   Yes Historical Provider, MD  levofloxacin (LEVAQUIN) 500 MG tablet Take 1 tablet (500 mg total) by mouth daily. Patient taking differently: Take 500 mg by mouth daily. For 5 days 12/05/14  Yes Virgel Manifold, MD  loperamide (IMODIUM A-D) 2 MG tablet Take 2 mg by mouth 4 (four) times daily as needed for diarrhea or loose stools.   Yes Historical Provider, MD  Loperamide HCl (IMODIUM PO) Take by mouth.    Historical Provider, MD   Physical Exam: Filed Vitals:   12/07/14 1523 12/07/14 1830 12/07/14 2124 12/07/14 2220  BP: 127/79 129/67 136/78 124/82  Pulse: 111 96 110 111  Temp: 99.1 F (37.3 C)   100.9 F (38.3 C)  TempSrc: Oral   Oral  Resp: 20 18  16   Height: 5' 7"  (1.702 m)     Weight: 70.308 kg (155 lb)     SpO2: 100% 97% 99% 98%    Wt Readings from Last 3 Encounters:  12/07/14 70.308 kg (155 lb)  12/05/14 70.308 kg (155 lb)    General:  Appears calm and comfortable Eyes: PERRL, normal lids, irises & conjunctiva ENT: grossly normal hearing, lips & tongue Neck:  no LAD, masses or thyromegaly Cardiovascular: RRR, no m/r/g. No LE edema. Respiratory: CTA bilaterally, no w/r/r. Normal respiratory effort. Abdomen: soft, non-distended +tenderness noted in teh LLQ and suprapubic area. No rebound noted Skin: no rash or induration seen on limited exam Musculoskeletal: grossly normal tone BUE/BLE Psychiatric: grossly normal mood and affect Neurologic: grossly non-focal.          Labs on Admission:  Basic Metabolic Panel:  Recent Labs Lab 12/05/14 1850 12/07/14 1650  NA 135 132*  K 3.6 3.7  CL 103 104  CO2 24  15*  GLUCOSE 95 77  BUN 11 6  CREATININE 0.54 0.54  CALCIUM 8.9 8.8*   Liver Function Tests:  Recent Labs Lab 12/05/14 1850 12/07/14 1650  AST 20 20  ALT 59* 43  ALKPHOS 53 73  BILITOT 0.7 0.9  PROT 7.4 8.1  ALBUMIN 3.5 3.4*    Recent Labs Lab 12/05/14 1850 12/07/14 1650  LIPASE 19* 29   No results for input(s): AMMONIA in the last 168 hours. CBC:  Recent Labs Lab 12/05/14 1850 12/07/14 1650  WBC 8.9 9.5  NEUTROABS  --  6.7  HGB 14.0 13.4  HCT 41.3 39.0  MCV 95.4 94.2  PLT 273 332   Cardiac Enzymes: No results for input(s): CKTOTAL, CKMB, CKMBINDEX, TROPONINI in the last 168 hours.  BNP (last 3 results) No results for input(s): BNP in the last 8760 hours.  ProBNP (last 3 results) No results for input(s): PROBNP in the last 8760 hours.  CBG: No results for input(s): GLUCAP in the last 168 hours.  Radiological Exams on Admission: Ct Abdomen Pelvis W Contrast  12/07/2014   CLINICAL DATA:  Abdominal pain, treated for bacterial intestinal infection Sunday, worsening nausea and vomiting, diarrhea  EXAM: CT ABDOMEN AND PELVIS WITH CONTRAST  TECHNIQUE: Multidetector CT imaging of the abdomen and pelvis was performed using the standard protocol following bolus administration of intravenous contrast.  CONTRAST:  32m OMNIPAQUE IOHEXOL 300 MG/ML SOLN, 1025mOMNIPAQUE IOHEXOL 300 MG/ML SOLN  COMPARISON:  02/24/2014  FINDINGS: Lung bases are unremarkable. Sagittal images of the spine are unremarkable. Liver, spleen, pancreas and adrenal glands are unremarkable. Kidneys are symmetrical in size and enhancement. No hydronephrosis or hydroureter. No calcified gallstones are noted within gallbladder. No aortic aneurysm. No small bowel obstruction. No ascites or free air. No adenopathy. There is no pericecal inflammation. The terminal ileum is unremarkable. There is a low lying cecum. Normal appendix is partially visualized in coronal image 39.  There is abnormal thickening of  colonic wall in splenic flexure of the colon, descending colon and sigmoid colon. Mild enhancement of mucosa in sigmoid colon. Some liquid stool noted within sigmoid colon. Findings are consistent with segmental colitis. Clinical correlation is necessary. There is no evidence of pericolonic abscess or perforation. No significant adenopathy. No free abdominal air.  IMPRESSION: 1. There is thickening of colonic wall up to 6 mm in splenic flexure of the colon, descending colon and sigmoid colon. Findings are consistent with long segment colitis. No pericolonic abscess or perforation. 2. There is a low lying cecum. Normal appendix. No pericecal inflammation. 3. No small bowel obstruction.  No free abdominal air. 4. No hydronephrosis or hydroureter.   Electronically Signed   By: LiLahoma Crocker.D.   On: 12/07/2014 18:20      Assessment/Plan Principal Problem:   Colitis Active Problems:   Hyponatremia   Acute colitis   1. Acute Colitis -admit for observation -will start on flagyl and  cipro -check stool cultures -check stool for C diff -GI consultation -IVF for dehydration  2. Hyponatremia -will start on IVF -repeat labs   Code Status: full code (must indicate code status--if unknown or must be presumed, indicate so) DVT Prophylaxis:heparin Family Communication: none (indicate person spoken with, if applicable, with phone number if by telephone) Disposition Plan: home (indicate anticipated LOS)    [facility] Pager [phone]

## 2014-12-07 NOTE — ED Notes (Addendum)
Recheck abdominal pain. She was seen here on Sunday and treated for possible bacterial infection in her colon. She was started on antibiotics. She is unable to eat. Pain in her abdomen is worse. Diarrhea for a week.

## 2014-12-08 DIAGNOSIS — A09 Infectious gastroenteritis and colitis, unspecified: Secondary | ICD-10-CM

## 2014-12-08 DIAGNOSIS — K529 Noninfective gastroenteritis and colitis, unspecified: Secondary | ICD-10-CM

## 2014-12-08 DIAGNOSIS — K921 Melena: Secondary | ICD-10-CM | POA: Diagnosis present

## 2014-12-08 DIAGNOSIS — R197 Diarrhea, unspecified: Secondary | ICD-10-CM | POA: Diagnosis present

## 2014-12-08 LAB — COMPREHENSIVE METABOLIC PANEL
ALK PHOS: 95 U/L (ref 38–126)
ALT: 37 U/L (ref 14–54)
AST: 21 U/L (ref 15–41)
Albumin: 2.9 g/dL — ABNORMAL LOW (ref 3.5–5.0)
Anion gap: 11 (ref 5–15)
BUN: <5 mg/dL — ABNORMAL LOW (ref 6–20)
CALCIUM: 8.4 mg/dL — AB (ref 8.9–10.3)
CO2: 17 mmol/L — AB (ref 22–32)
CREATININE: 0.54 mg/dL (ref 0.44–1.00)
Chloride: 105 mmol/L (ref 101–111)
Glucose, Bld: 93 mg/dL (ref 65–99)
Potassium: 3.7 mmol/L (ref 3.5–5.1)
Sodium: 133 mmol/L — ABNORMAL LOW (ref 135–145)
Total Bilirubin: 0.9 mg/dL (ref 0.3–1.2)
Total Protein: 6.7 g/dL (ref 6.5–8.1)

## 2014-12-08 LAB — URINALYSIS, ROUTINE W REFLEX MICROSCOPIC
BILIRUBIN URINE: NEGATIVE
Glucose, UA: NEGATIVE mg/dL
Ketones, ur: >80 mg/dL — AB
Leukocytes, UA: NEGATIVE
NITRITE: NEGATIVE
PH: 6 (ref 5.0–8.0)
Protein, ur: NEGATIVE mg/dL
SPECIFIC GRAVITY, URINE: 1.008 (ref 1.005–1.030)
Urobilinogen, UA: 0.2 mg/dL (ref 0.0–1.0)

## 2014-12-08 LAB — GLUCOSE, CAPILLARY: GLUCOSE-CAPILLARY: 88 mg/dL (ref 65–99)

## 2014-12-08 LAB — CBC
HEMATOCRIT: 36.7 % (ref 36.0–46.0)
HEMOGLOBIN: 12.6 g/dL (ref 12.0–15.0)
MCH: 32.1 pg (ref 26.0–34.0)
MCHC: 34.3 g/dL (ref 30.0–36.0)
MCV: 93.6 fL (ref 78.0–100.0)
Platelets: 298 10*3/uL (ref 150–400)
RBC: 3.92 MIL/uL (ref 3.87–5.11)
RDW: 12.8 % (ref 11.5–15.5)
WBC: 8.4 10*3/uL (ref 4.0–10.5)

## 2014-12-08 LAB — OCCULT BLOOD X 1 CARD TO LAB, STOOL: Fecal Occult Bld: POSITIVE — AB

## 2014-12-08 LAB — C DIFFICILE QUICK SCREEN W PCR REFLEX
C DIFFICILE (CDIFF) TOXIN: NEGATIVE
C Diff antigen: NEGATIVE
C Diff interpretation: NEGATIVE

## 2014-12-08 LAB — URINE MICROSCOPIC-ADD ON

## 2014-12-08 LAB — TSH: TSH: 4.967 u[IU]/mL — ABNORMAL HIGH (ref 0.350–4.500)

## 2014-12-08 LAB — HIV ANTIBODY (ROUTINE TESTING W REFLEX): HIV SCREEN 4TH GENERATION: NONREACTIVE

## 2014-12-08 MED ORDER — CIPROFLOXACIN IN D5W 400 MG/200ML IV SOLN
400.0000 mg | Freq: Two times a day (BID) | INTRAVENOUS | Status: DC
  Administered 2014-12-08 – 2014-12-12 (×9): 400 mg via INTRAVENOUS
  Filled 2014-12-08 (×10): qty 200

## 2014-12-08 MED ORDER — HYDROMORPHONE HCL 2 MG/ML IJ SOLN
1.0000 mg | INTRAMUSCULAR | Status: DC | PRN
Start: 2014-12-08 — End: 2014-12-08
  Administered 2014-12-08: 1 mg via INTRAVENOUS
  Filled 2014-12-08: qty 1

## 2014-12-08 MED ORDER — HYDROMORPHONE HCL 1 MG/ML IJ SOLN
INTRAMUSCULAR | Status: AC
  Administered 2014-12-08: 1 mg
  Filled 2014-12-08: qty 1

## 2014-12-08 MED ORDER — HYDROMORPHONE HCL 1 MG/ML IJ SOLN
1.0000 mg | INTRAMUSCULAR | Status: DC | PRN
  Administered 2014-12-08 – 2014-12-13 (×24): 1 mg via INTRAVENOUS
  Filled 2014-12-08 (×24): qty 1

## 2014-12-08 NOTE — Consult Note (Signed)
Referring Provider:  [facility] Primary Care Physician:  No PCP Per Patient Primary Gastroenterologist:  Gentry Fitz   Reason for Consultation:  Bloody diarrhea; colitis  HPI: Nicole Reeves is a 29 y.o. female with no past medical history presents to [facility] hospital with abdominal pain and bloody diarrhea. Patient has been having pain in her lower abdomen and diarrhea for about 9 days now.  going sometimes at least 15 times per day.  Patient was seen and started on oral levaquin for a presumed bacterial infection and she has been taking imodium without much improvement. She denies recent travel. She has had no sick contacts or bad food exposure. Some associated nausea and vomiting. No fevers were noted, but had chills. She states that she works at a [employer] and has been eating there. No one has been sick there that she is aware of. She denies any similar symptoms in the past and prior to the onset of this illness she was moving her bowels regularly.  Denies any family history of IBD to her knowledge.  Just came in last night but no improvement in symptoms so far.  She is on empiric cipro and flagyl.  Cdiff stool study negative and culture/O&P still pending.  CT scan showed thickening of the colon wall from the splenic flexure to the descending colon and sigmoid colon c/w long-segment colitis.  Prior to Admission medications   Medication Sig Start Date End Date Taking? Authorizing Provider  ibuprofen (ADVIL,MOTRIN) 200 MG tablet Take 800 mg by mouth every 6 (six) hours as needed (for pain.).   Yes Historical Provider, MD  levofloxacin (LEVAQUIN) 500 MG tablet Take 1 tablet (500 mg total) by mouth daily. Patient taking differently: Take 500 mg by mouth daily. For 5 days 12/05/14  Yes Raeford Razor, MD  loperamide (IMODIUM A-D) 2 MG tablet Take 2 mg by mouth 4 (four) times daily as needed for diarrhea or loose stools.   Yes Historical Provider, MD  Loperamide HCl (IMODIUM PO) Take by mouth.    Historical  Provider, MD    Current Facility-Administered Medications  Medication Dose Route Frequency Provider Last Rate Last Dose  . 0.9 %  sodium chloride infusion   Intravenous STAT Geoffery Lyons, MD 100 mL/hr at 12/07/14 2302    . 0.9 %  sodium chloride infusion   Intravenous Continuous Yevonne Pax, MD 75 mL/hr at 12/08/14 0030    . acetaminophen (TYLENOL) tablet 650 mg  650 mg Oral Q6H PRN Yevonne Pax, MD       Or  . acetaminophen (TYLENOL) suppository 650 mg  650 mg Rectal Q6H PRN Yevonne Pax, MD      . ciprofloxacin (CIPRO) IVPB 400 mg  400 mg Intravenous Q12H Leann T Poindexter, RPH   400 mg at 12/08/14 0200  . folic acid (FOLVITE) tablet 1 mg  1 mg Oral Daily Yevonne Pax, MD      . heparin injection 5,000 Units  5,000 Units Subcutaneous 3 times per day Yevonne Pax, MD   5,000 Units at 12/08/14 0552  . HYDROmorphone (DILAUDID) injection 1 mg  1 mg Intravenous Q4H PRN Geoffery Lyons, MD   1 mg at 12/08/14 0828  . metroNIDAZOLE (FLAGYL) IVPB 500 mg  500 mg Intravenous Q8H Yevonne Pax, MD   500 mg at 12/08/14 0825  . multivitamin with minerals tablet 1 tablet  1 tablet Oral Daily Yevonne Pax, MD      . ondansetron [facility]) tablet 4 mg  4 mg Oral Q6H PRN Yevonne PaxSaadat A Khan, MD       Or  . ondansetron [facility](ZOFRAN) injection 4 mg  4 mg Intravenous Q6H PRN Yevonne PaxSaadat A Khan, MD   4 mg at 12/08/14 0432  . thiamine (VITAMIN B-1) tablet 100 mg  100 mg Oral Daily Yevonne PaxSaadat A Khan, MD        Allergies as of 12/07/2014  . (No Known Allergies)    No family history on file.  Social History   Social History  . Marital Status: Single    Spouse Name: N/A  . Number of Children: N/A  . Years of Education: N/A   Occupational History  . Not on file.   Social History Main Topics  . Smoking status: Current Every Day Smoker -- 1.00 packs/day    Types: Cigarettes  . Smokeless tobacco: Not on file  . Alcohol Use: Yes  . Drug Use: Yes    Special: Marijuana  . Sexual Activity: Not on file   Other Topics  Concern  . Not on file   Social History Narrative    Review of Systems: Ten point ROS is O/W negative except as mentioned in HPI.  Physical Exam: Vital signs in last 24 hours: Temp:  [98.4 F (36.9 C)-100.9 F (38.3 C)] 98.4 F (36.9 C) (10/12 0506) Pulse Rate:  [96-111] 111 (10/11 2220) Resp:  [16-20] 16 (10/12 0506) BP: (124-136)/(67-82) 124/82 mmHg (10/11 2220) SpO2:  [97 %-100 %] 99 % (10/12 0506) Weight:  [154 lb 15.7 oz (70.3 kg)-155 lb (70.308 kg)] 154 lb 15.7 oz (70.3 kg) (10/12 0500) Last BM Date: 12/08/14 General:  Alert, Well-developed, well-nourished, pleasant and cooperative in NAD Head:  Normocephalic and atraumatic. Eyes:  Sclera clear, no icterus.  Conjunctiva pink. Ears:  Normal auditory acuity. Mouth:  No deformity or lesions.   Lungs:  Clear throughout to auscultation.  No wheezes, crackles, or rhonchi.  Heart:  Tachy but regular rhythm.  No murmurs, rubs, or gallops. Abdomen:  Soft, non-distended.  BS present.  Mild lower abdominal TTP. Rectal:  Deferred  Msk:  Symmetrical without gross deformities. Pulses:  Normal pulses noted. Extremities:  Without clubbing or edema. Neurologic:  Alert and  oriented x4;  grossly normal neurologically. Skin:  Intact without significant lesions or rashes. Psych:  Alert and cooperative. Normal mood and affect.  Intake/Output from previous day: 10/11 0701 - 10/12 0700 In: 699.2 [I.V.:399.2; IV Piggyback:300] Out: 2 [Urine:1; Stool:1]  Lab Results:  Recent Labs  12/05/14 1850 12/07/14 1650 12/08/14 0610  WBC 8.9 9.5 8.4  HGB 14.0 13.4 12.6  HCT 41.3 39.0 36.7  PLT 273 332 298   BMET  Recent Labs  12/05/14 1850 12/07/14 1650 12/08/14 0610  NA 135 132* 133*  K 3.6 3.7 3.7  CL 103 104 105  CO2 24 15* 17*  GLUCOSE 95 77 93  BUN 11 6 <5*  CREATININE 0.54 0.54 0.54  CALCIUM 8.9 8.8* 8.4*   LFT  Recent Labs  12/07/14 1650 12/08/14 0610  PROT 8.1 6.7  ALBUMIN 3.4* 2.9*  AST 20 21  ALT 43 37    ALKPHOS 73 95  BILITOT 0.9 0.9  BILIDIR 0.1  --   IBILI 0.8  --    Studies/Results: Ct Abdomen Pelvis W Contrast  12/07/2014  CLINICAL DATA:  Abdominal pain, treated for bacterial intestinal infection Sunday, worsening nausea and vomiting, diarrhea EXAM: CT ABDOMEN AND PELVIS WITH CONTRAST TECHNIQUE: Multidetector CT imaging of the abdomen and pelvis was  performed using the standard protocol following bolus administration of intravenous contrast. CONTRAST:  25mL OMNIPAQUE IOHEXOL 300 MG/ML SOLN, OMNIPAQUE IOHEXOL 300 MG/ML SOLN COMPARISON:  02/24/2014 FINDINGS: Lung bases are unremarkable. Sagittal images of the spine are unremarkable. Liver, spleen, pancreas and adrenal glands are unremarkable. Kidneys are symmetrical in size and enhancement. No hydronephrosis or hydroureter. No calcified gallstones are noted within gallbladder. No aortic aneurysm. No small bowel obstruction. No ascites or free air. No adenopathy. There is no pericecal inflammation. The terminal ileum is unremarkable. There is a low lying cecum. Normal appendix is partially visualized in coronal image 39. There is abnormal thickening of colonic wall in splenic flexure of the colon, descending colon and sigmoid colon. Mild enhancement of mucosa in sigmoid colon. Some liquid stool noted within sigmoid colon. Findings are consistent with segmental colitis. Clinical correlation is necessary. There is no evidence of pericolonic abscess or perforation. No significant adenopathy. No free abdominal air. IMPRESSION: 1. There is thickening of colonic wall up to 6 mm in splenic flexure of the colon, descending colon and sigmoid colon. Findings are consistent with long segment colitis. No pericolonic abscess or perforation. 2. There is a low lying cecum. Normal appendix. No pericecal inflammation. 3. No small bowel obstruction.  No free abdominal air. 4. No hydronephrosis or hydroureter. Electronically Signed   By: Natasha Mead M.D.   On:  12/07/2014 18:20    IMPRESSION/PLAN:  -Bloody diarrhea with abdominal pain with colitis on CT scan:  All sudden in onset with no previous GI issues, which certainly makes this sound infectious.  However, it is somewhat prolonged course now with symptoms for 9 days.  Continue with conservative/supportive measures including empiric antibiotics (on cipro and flagyl), IVF's, only full liquids for now.  Will follow-up remaining stool studies.  Expect complete resolution of symptoms but if she fails to improve then she may need colonoscopy to evaluate for/rule out IBD.    Female Minish D.  12/08/2014, 8:52 AM  Pager number [phone]

## 2014-12-08 NOTE — Progress Notes (Signed)
ANTIBIOTIC CONSULT NOTE - INITIAL  Pharmacy Consult for Cipro Indication: Intra-abdominal infection  No Known Allergies  Patient Measurements: Height: 5\' 7"  (170.2 cm) Weight: 155 lb (70.308 kg) IBW/kg (Calculated) : 61.6  Vital Signs: Temp: 100.9 F (38.3 C) (10/11 2220) Temp Source: Oral (10/11 2220) BP: 124/82 mmHg (10/11 2220) Pulse Rate: 111 (10/11 2220) Intake/Output from previous day:   Intake/Output from this shift:    Labs:  Recent Labs  12/05/14 1850 12/07/14 1650  WBC 8.9 9.5  HGB 14.0 13.4  PLT 273 332  CREATININE 0.54 0.54   Estimated Creatinine Clearance: 101.8 mL/min (by C-G formula based on Cr of 0.54). No results for input(s): VANCOTROUGH, VANCOPEAK, VANCORANDOM, GENTTROUGH, GENTPEAK, GENTRANDOM, TOBRATROUGH, TOBRAPEAK, TOBRARND, AMIKACINPEAK, AMIKACINTROU, AMIKACIN in the last 72 hours.   Microbiology: Recent Results (from the past 720 hour(s))  Urine culture     Status: None   Collection Time: 12/05/14  6:15 PM  Result Value Ref Range Status   Specimen Description URINE, CATHETERIZED  Final   Special Requests NONE  Final   Culture   Final    NO GROWTH 2 DAYS [facility]    Report Status 12/07/2014 FINAL  Final    Medical History: History reviewed. No pertinent past medical history.  Medications:  Scheduled:  . sodium chloride   Intravenous STAT  . ciprofloxacin  400 mg Intravenous Q12H  . folic acid  1 mg Oral Daily  . heparin  5,000 Units Subcutaneous 3 times per day  . metronidazole  500 mg Intravenous Q8H  . multivitamin with minerals  1 tablet Oral Daily  . thiamine  100 mg Oral Daily   Infusions:  . sodium chloride     Assessment:  [age] yr female with abdominal pain, diarrhea.  Has recently been on levaquin.  Patient being admitted for acute colitis  Pharmacy consulted to dose Cipro for intra-abdominal infection and MD also ordered Metronidazole 500mg  IV q8h  10/12 >>Cipro >> 10/12 >>Flagyl >>     10/12 stool: 10/12 C Diff:  Goal of Therapy:  Eradication of infection  Plan:  Follow up culture results  Cipro 400mg  IV q12h Flagyl per MD   Delsa Walder, Joselyn GlassmanLeann Trefz, PharmD 12/08/2014,12:03 AM

## 2014-12-08 NOTE — Progress Notes (Signed)
[facility] PROGRESS NOTE  Nicole Reeves AVW:[id number] DOB: Apr 05, 1985 DOA: 12/07/2014 PCP: No PCP Per Patient  Assessment/Plan: 1-Acute colitis;  Continue with ciprofloxacin and Flagyl.  Check Stool culture.  C diff negative.  GI following.   2-Dysuria;  Check UA. Blader scan.   3-Metabolic acidosis, hyponatremia; in setting of diarrhea. Increase IV fluids.   4-Screening for HIV.   Code Status: full code. Family Communication: care discussed with patient.  Disposition Plan: Remain inpatient.    Consultants:  GI  Procedures:  none  Antibiotics:  Cipro 10-12  Flagyl 10-12  HPI/Subjective: Having multiple BM, watery. Abdominal pain/   Objective: Filed Vitals:   12/08/14 0506  BP:   Temp: 98.4 F (36.9 C)  Resp: 16    Intake/Output Summary (Last 24 hours) at 12/08/14 1400 Last data filed at 12/08/14 0432  Gross per 24 hour  Intake 699.17 ml  Output      2 ml  Net 697.17 ml   Filed Weights   12/07/14 1523 12/08/14 0500  Weight: 70.308 kg (155 lb) 70.3 kg (154 lb 15.7 oz)    Exam:   General: NAD  Cardiovascular: S 1, S 2 RRR  Respiratory: CTA  Abdomen: Bs present, soft, mild tenderness  Musculoskeletal: no edema  Data Reviewed: Basic Metabolic Panel:  Recent Labs Lab 12/05/14 1850 12/07/14 1650 12/08/14 0610  NA 135 132* 133*  K 3.6 3.7 3.7  CL 103 104 105  CO2 24 15* 17*  GLUCOSE 95 77 93  BUN 11 6 <5*  CREATININE 0.54 0.54 0.54  CALCIUM 8.9 8.8* 8.4*   Liver Function Tests:  Recent Labs Lab 12/05/14 1850 12/07/14 1650 12/08/14 0610  AST 20 20 21   ALT 59* 43 37  ALKPHOS 53 73 95  BILITOT 0.7 0.9 0.9  PROT 7.4 8.1 6.7  ALBUMIN 3.5 3.4* 2.9*    Recent Labs Lab 12/05/14 1850 12/07/14 1650  LIPASE 19* 29   No results for input(s): AMMONIA in the last 168 hours. CBC:  Recent Labs Lab 12/05/14 1850 12/07/14 1650 12/08/14 0610  WBC 8.9 9.5 8.4  NEUTROABS  --  6.7  --   HGB 14.0 13.4 12.6  HCT  41.3 39.0 36.7  MCV 95.4 94.2 93.6  PLT 273 332 298   Cardiac Enzymes: No results for input(s): CKTOTAL, CKMB, CKMBINDEX, TROPONINI in the last 168 hours. BNP (last 3 results) No results for input(s): BNP in the last 8760 hours.  ProBNP (last 3 results) No results for input(s): PROBNP in the last 8760 hours.  CBG:  Recent Labs Lab 12/08/14 0755  GLUCAP 88    Recent Results (from the past 240 hour(s))  Urine culture     Status: None   Collection Time: 12/05/14  6:15 PM  Result Value Ref Range Status   Specimen Description URINE, CATHETERIZED  Final   Special Requests NONE  Final   Culture   Final    NO GROWTH 2 DAYS Performed at [facility]    Report Status 12/07/2014 FINAL  Final  C difficile quick scan w PCR reflex     Status: None   Collection Time: 12/08/14 12:24 AM  Result Value Ref Range Status   C Diff antigen NEGATIVE NEGATIVE Final   C Diff toxin NEGATIVE NEGATIVE Final   C Diff interpretation Negative for toxigenic C. difficile  Final     Studies: Ct Abdomen Pelvis W Contrast  12/07/2014  CLINICAL DATA:  Abdominal pain, treated for bacterial intestinal infection  Sunday, worsening nausea and vomiting, diarrhea EXAM: CT ABDOMEN AND PELVIS WITH CONTRAST TECHNIQUE: Multidetector CT imaging of the abdomen and pelvis was performed using the standard protocol following bolus administration of intravenous contrast. CONTRAST:  12m OMNIPAQUE IOHEXOL 300 MG/ML SOLN, 1063mOMNIPAQUE IOHEXOL 300 MG/ML SOLN COMPARISON:  02/24/2014 FINDINGS: Lung bases are unremarkable. Sagittal images of the spine are unremarkable. Liver, spleen, pancreas and adrenal glands are unremarkable. Kidneys are symmetrical in size and enhancement. No hydronephrosis or hydroureter. No calcified gallstones are noted within gallbladder. No aortic aneurysm. No small bowel obstruction. No ascites or free air. No adenopathy. There is no pericecal inflammation. The terminal ileum is unremarkable. There  is a low lying cecum. Normal appendix is partially visualized in coronal image 39. There is abnormal thickening of colonic wall in splenic flexure of the colon, descending colon and sigmoid colon. Mild enhancement of mucosa in sigmoid colon. Some liquid stool noted within sigmoid colon. Findings are consistent with segmental colitis. Clinical correlation is necessary. There is no evidence of pericolonic abscess or perforation. No significant adenopathy. No free abdominal air. IMPRESSION: 1. There is thickening of colonic wall up to 6 mm in splenic flexure of the colon, descending colon and sigmoid colon. Findings are consistent with long segment colitis. No pericolonic abscess or perforation. 2. There is a low lying cecum. Normal appendix. No pericecal inflammation. 3. No small bowel obstruction.  No free abdominal air. 4. No hydronephrosis or hydroureter. Electronically Signed   By: LiLahoma Crocker.D.   On: 12/07/2014 18:20    Scheduled Meds: . ciprofloxacin  400 mg Intravenous Q12H  . folic acid  1 mg Oral Daily  . heparin  5,000 Units Subcutaneous 3 times per day  . metronidazole  500 mg Intravenous Q8H  . multivitamin with minerals  1 tablet Oral Daily  . thiamine  100 mg Oral Daily   Continuous Infusions: . sodium chloride 125 mL/hr at 12/08/14 1342    Principal Problem:   Colitis Active Problems:   Hyponatremia   Acute colitis   Diarrhea of presumed infectious origin   Blood in stool    Time spent: 35 minutes.     ReNiel Hummer  [facility] Pager [phone] 7PM-7AM, please contact night-coverage at [email or web address], password [email or web address]0/01/2015, 2:00 PM  LOS: 1 day

## 2014-12-08 NOTE — Care Management Note (Signed)
Case Management Note  Patient Details  Name: [name] MRN: [id number] Date of Birth: Aug 17, 1985  Subjective/Objective:      abd pain colitis with bloody stools              Action/Plan: Date:  Oct. 12, 2016 U.R. performed for needs and level of care. Will continue to follow for Case Management needs.  Nicole Smilinghonda Maggi Hershkowitz, RN, BSN, [place]   [phone]  Expected Discharge Date:                  Expected Discharge Plan:  Home/Self Care  In-House Referral:  NA  Discharge planning Services  CM Consult  Post Acute Care Choice:  NA Choice offered to:  NA  DME Arranged:    DME Agency:     HH Arranged:    HH Agency:     Status of Service:  In process, will continue to follow  Medicare Important Message Given:    Date Medicare IM Given:    Medicare IM give by:    Date Additional Medicare IM Given:    Additional Medicare Important Message give by:     If discussed at Long Length of Stay Meetings, dates discussed:    Additional Comments:  Golda AcreDavis, [place]ster[place], RN 12/08/2014, 10:41 AM

## 2014-12-09 DIAGNOSIS — E871 Hypo-osmolality and hyponatremia: Secondary | ICD-10-CM

## 2014-12-09 LAB — CBC
HCT: 34.7 % — ABNORMAL LOW (ref 36.0–46.0)
Hemoglobin: 12.2 g/dL (ref 12.0–15.0)
MCH: 32.3 pg (ref 26.0–34.0)
MCHC: 35.2 g/dL (ref 30.0–36.0)
MCV: 91.8 fL (ref 78.0–100.0)
PLATELETS: 287 10*3/uL (ref 150–400)
RBC: 3.78 MIL/uL — ABNORMAL LOW (ref 3.87–5.11)
RDW: 12.6 % (ref 11.5–15.5)
WBC: 7.1 10*3/uL (ref 4.0–10.5)

## 2014-12-09 LAB — BASIC METABOLIC PANEL
ANION GAP: 9 (ref 5–15)
Anion gap: 6 (ref 5–15)
BUN: <5 mg/dL — ABNORMAL LOW (ref 6–20)
CALCIUM: 7.9 mg/dL — AB (ref 8.9–10.3)
CALCIUM: 8 mg/dL — AB (ref 8.9–10.3)
CO2: 21 mmol/L — ABNORMAL LOW (ref 22–32)
CO2: 24 mmol/L (ref 22–32)
CREATININE: 0.45 mg/dL (ref 0.44–1.00)
CREATININE: 0.39 mg/dL — AB (ref 0.44–1.00)
Chloride: 103 mmol/L (ref 101–111)
Chloride: 106 mmol/L (ref 101–111)
GFR calc Af Amer: >60 mL/min (ref >60)
GLUCOSE: 107 mg/dL — AB (ref 65–99)
GLUCOSE: 128 mg/dL — AB (ref 65–99)
Potassium: 2.8 mmol/L — ABNORMAL LOW (ref 3.5–5.1)
Potassium: 3.2 mmol/L — ABNORMAL LOW (ref 3.5–5.1)
Sodium: 133 mmol/L — ABNORMAL LOW (ref 135–145)
Sodium: 136 mmol/L (ref 135–145)

## 2014-12-09 LAB — OVA AND PARASITE EXAMINATION

## 2014-12-09 LAB — MAGNESIUM: MAGNESIUM: 1.7 mg/dL (ref 1.7–2.4)

## 2014-12-09 LAB — HEMOGLOBIN A1C
Hgb A1c MFr Bld: 5.1 % (ref 4.8–5.6)
Mean Plasma Glucose: 100 mg/dL

## 2014-12-09 MED ORDER — POTASSIUM CHLORIDE 10 MEQ/100ML IV SOLN
10.0000 meq | INTRAVENOUS | Status: AC
  Administered 2014-12-09 (×4): 10 meq via INTRAVENOUS
  Filled 2014-12-09 (×4): qty 100

## 2014-12-09 MED ORDER — KCL IN DEXTROSE-NACL 20-5-0.9 MEQ/L-%-% IV SOLN
INTRAVENOUS | Status: DC
  Administered 2014-12-09 – 2014-12-11 (×4): via INTRAVENOUS
  Filled 2014-12-09 (×7): qty 1000

## 2014-12-09 NOTE — Progress Notes (Signed)
[facility] Progress Note  Subjective:  Still tachy and with temp of 102 degrees this AM.  Says that diarrhea really is not any better, but not seeing any more blood.  Still has diffuse abdominal discomfort.  Objective:  Vital signs in last 24 hours: Temp:  [100.1 F (37.8 C)-102.1 F (38.9 C)] 102.1 F (38.9 C) (10/13 0525) Pulse Rate:  [105-114] 105 (10/13 0525) Resp:  [16] 16 (10/13 0525) BP: (118-127)/(68-72) 118/68 mmHg (10/13 0525) SpO2:  [97 %-98 %] 98 % (10/13 0525) Weight:  [158 lb 1.1 oz (71.7 kg)] 158 lb 1.1 oz (71.7 kg) (10/13 0525) Last BM Date: 12/09/14 General:  Alert, Well-developed, in NAD Heart:  Tachy but regular rhythm Pulm:  CTAB.  No W/R/R. Abdomen:  Soft, non-distended.  BS present.  Mild diffuse TTP but > in lower abdomen. Extremities:  Without edema. Neurologic:  Alert and  oriented x4;  grossly normal neurologically. Psych:  Alert and cooperative. Normal mood and affect.  Intake/Output from previous day: 10/12 0701 - 10/13 0700 In: 4272.9 [P.O.:900; I.V.:2672.9; IV Piggyback:700] Out: 450 [Urine:450]  Lab Results:  Recent Labs  12/07/14 1650 12/08/14 0610 12/09/14 0540  WBC 9.5 8.4 7.1  HGB 13.4 12.6 12.2  HCT 39.0 36.7 34.7*  PLT 332 298 287   BMET  Recent Labs  12/07/14 1650 12/08/14 0610 12/09/14 0540  NA 132* 133* 133*  K 3.7 3.7 2.8*  CL 104 105 103  CO2 15* 17* 21*  GLUCOSE 77 93 107*  BUN 6 <5* <5*  CREATININE 0.54 0.54 0.45  CALCIUM 8.8* 8.4* 7.9*   LFT  Recent Labs  12/07/14 1650 12/08/14 0610  PROT 8.1 6.7  ALBUMIN 3.4* 2.9*  AST 20 21  ALT 43 37  ALKPHOS 73 95  BILITOT 0.9 0.9  BILIDIR 0.1  --   IBILI 0.8  --    Ct Abdomen Pelvis W Contrast  12/07/2014  CLINICAL DATA:  Abdominal pain, treated for bacterial intestinal infection Sunday, worsening nausea and vomiting, diarrhea EXAM: CT ABDOMEN AND PELVIS WITH CONTRAST TECHNIQUE: Multidetector CT imaging of the abdomen and pelvis was  performed using the standard protocol following bolus administration of intravenous contrast. CONTRAST:  53m OMNIPAQUE IOHEXOL 300 MG/ML SOLN, 1014mOMNIPAQUE IOHEXOL 300 MG/ML SOLN COMPARISON:  02/24/2014 FINDINGS: Lung bases are unremarkable. Sagittal images of the spine are unremarkable. Liver, spleen, pancreas and adrenal glands are unremarkable. Kidneys are symmetrical in size and enhancement. No hydronephrosis or hydroureter. No calcified gallstones are noted within gallbladder. No aortic aneurysm. No small bowel obstruction. No ascites or free air. No adenopathy. There is no pericecal inflammation. The terminal ileum is unremarkable. There is a low lying cecum. Normal appendix is partially visualized in coronal image 39. There is abnormal thickening of colonic wall in splenic flexure of the colon, descending colon and sigmoid colon. Mild enhancement of mucosa in sigmoid colon. Some liquid stool noted within sigmoid colon. Findings are consistent with segmental colitis. Clinical correlation is necessary. There is no evidence of pericolonic abscess or perforation. No significant adenopathy. No free abdominal air. IMPRESSION: 1. There is thickening of colonic wall up to 6 mm in splenic flexure of the colon, descending colon and sigmoid colon. Findings are consistent with long segment colitis. No pericolonic abscess or perforation. 2. There is a low lying cecum. Normal appendix. No pericecal inflammation. 3. No small bowel obstruction.  No free abdominal air. 4. No hydronephrosis or hydroureter. Electronically Signed   By: LiLahoma Crocker  M.D.   On: 12/07/2014 18:20    Assessment / Plan: *Bloody diarrhea and abdominal pain with colitis on CT scan: All sudden in onset with no previous GI issues, which certainly makes this sound infectious. However, it is somewhat prolonged course now with symptoms for 10 days and not much improvement since hospitalization/initation of empiric antibiotics. Cdiff negative but  stool culture and O&P still pending.  Continue with conservative/supportive measures including empiric antibiotics (on cipro and flagyl), IVF's, pain control, etc.  Will plan for flex sig later today with tap water enema prior to procedure. *Hypokalemia:  K+ 2.8 this AM.  Hospitalist ordered 4 runs so will repeat BMP STAT following infusion.  **Addendum:  Postponing flex sig until tomorrow morning, 10/14, since K+ repletion will not be completed until late today.  Will recheck BMP in AM.  Plan for tap water enema tomorrow at 8:00 AM.  Can resume clear liquids today and NPO after midnight.   LOS: 2 days   ZEHR, JESSICA D.  12/09/2014, 10:28 AM  Pager number [phone]

## 2014-12-09 NOTE — Progress Notes (Signed)
[facility] PROGRESS NOTE  Nicole Reeves NHA:[id number] DOB: 11-18-1985 DOA: 12/07/2014 PCP: No PCP Per Patient  Assessment/Plan: 1-Acute colitis;  Continue with ciprofloxacin and Flagyl. Depending on sigmoidoscopy  result could consider changing IV antibiotics due to fever.  Stool culture, ova and  parasite pending.   C diff negative.  GI following. Plan for sigmoidoscopy today.  HIV negative  2-Dysuria;   UA negative for infection.   3-Metabolic acidosis, hyponatremia; in setting of diarrhea. Continue with  IV fluids.   4-Hypokalemia; replete with 4 runs, IV KCl in IV fluids. Repeat B-met this afternoon. Check mg level.   5-Elevated TSH; check free T 3 and T 4/   Code Status: full code. Family Communication: care discussed with patient.  Disposition Plan: Remain inpatient.    Consultants:  GI  Procedures:  none  Antibiotics:  Cipro 10-12  Flagyl 10-12  HPI/Subjective: Still with multiples BM, diarrhea, pain is the same  Objective: Filed Vitals:   12/09/14 0525  BP: 118/68  Pulse: 105  Temp: 102.1 F (38.9 C)  Resp: 16    Intake/Output Summary (Last 24 hours) at 12/09/14 1035 Last data filed at 12/09/14 0535  Gross per 24 hour  Intake 4032.92 ml  Output    450 ml  Net 3582.92 ml   Filed Weights   12/07/14 1523 12/08/14 0500 12/09/14 0525  Weight: 70.308 kg (155 lb) 70.3 kg (154 lb 15.7 oz) 71.7 kg (158 lb 1.1 oz)    Exam:   General: NAD  Cardiovascular: S 1, S 2 RRR  Respiratory: CTA  Abdomen: Bs present, soft, mild tenderness  Musculoskeletal: no edema  Data Reviewed: Basic Metabolic Panel:  Recent Labs Lab 12/05/14 1850 12/07/14 1650 12/08/14 0610 12/09/14 0540  NA 135 132* 133* 133*  K 3.6 3.7 3.7 2.8*  CL 103 104 105 103  CO2 24 15* 17* 21*  GLUCOSE 95 77 93 107*  BUN 11 6 <5* <5*  CREATININE 0.54 0.54 0.54 0.45  CALCIUM 8.9 8.8* 8.4* 7.9*   Liver Function Tests:  Recent Labs Lab 12/05/14 1850  12/07/14 1650 12/08/14 0610  AST 20 20 21   ALT 59* 43 37  ALKPHOS 53 73 95  BILITOT 0.7 0.9 0.9  PROT 7.4 8.1 6.7  ALBUMIN 3.5 3.4* 2.9*    Recent Labs Lab 12/05/14 1850 12/07/14 1650  LIPASE 19* 29   No results for input(s): AMMONIA in the last 168 hours. CBC:  Recent Labs Lab 12/05/14 1850 12/07/14 1650 12/08/14 0610 12/09/14 0540  WBC 8.9 9.5 8.4 7.1  NEUTROABS  --  6.7  --   --   HGB 14.0 13.4 12.6 12.2  HCT 41.3 39.0 36.7 34.7*  MCV 95.4 94.2 93.6 91.8  PLT 273 332 298 287   Cardiac Enzymes: No results for input(s): CKTOTAL, CKMB, CKMBINDEX, TROPONINI in the last 168 hours. BNP (last 3 results) No results for input(s): BNP in the last 8760 hours.  ProBNP (last 3 results) No results for input(s): PROBNP in the last 8760 hours.  CBG:  Recent Labs Lab 12/08/14 0755  GLUCAP 88    Recent Results (from the past 240 hour(s))  Urine culture     Status: None   Collection Time: 12/05/14  6:15 PM  Result Value Ref Range Status   Specimen Description URINE, CATHETERIZED  Final   Special Requests NONE  Final   Culture   Final    NO GROWTH 2 DAYS Performed at [facility]    Report  Status 12/07/2014 FINAL  Final  C difficile quick scan w PCR reflex     Status: None   Collection Time: 12/08/14 12:24 AM  Result Value Ref Range Status   C Diff antigen NEGATIVE NEGATIVE Final   C Diff toxin NEGATIVE NEGATIVE Final   C Diff interpretation Negative for toxigenic C. difficile  Final     Studies: Ct Abdomen Pelvis W Contrast  12/07/2014  CLINICAL DATA:  Abdominal pain, treated for bacterial intestinal infection Sunday, worsening nausea and vomiting, diarrhea EXAM: CT ABDOMEN AND PELVIS WITH CONTRAST TECHNIQUE: Multidetector CT imaging of the abdomen and pelvis was performed using the standard protocol following bolus administration of intravenous contrast. CONTRAST:  17m OMNIPAQUE IOHEXOL 300 MG/ML SOLN, 1064mOMNIPAQUE IOHEXOL 300 MG/ML SOLN COMPARISON:   02/24/2014 FINDINGS: Lung bases are unremarkable. Sagittal images of the spine are unremarkable. Liver, spleen, pancreas and adrenal glands are unremarkable. Kidneys are symmetrical in size and enhancement. No hydronephrosis or hydroureter. No calcified gallstones are noted within gallbladder. No aortic aneurysm. No small bowel obstruction. No ascites or free air. No adenopathy. There is no pericecal inflammation. The terminal ileum is unremarkable. There is a low lying cecum. Normal appendix is partially visualized in coronal image 39. There is abnormal thickening of colonic wall in splenic flexure of the colon, descending colon and sigmoid colon. Mild enhancement of mucosa in sigmoid colon. Some liquid stool noted within sigmoid colon. Findings are consistent with segmental colitis. Clinical correlation is necessary. There is no evidence of pericolonic abscess or perforation. No significant adenopathy. No free abdominal air. IMPRESSION: 1. There is thickening of colonic wall up to 6 mm in splenic flexure of the colon, descending colon and sigmoid colon. Findings are consistent with long segment colitis. No pericolonic abscess or perforation. 2. There is a low lying cecum. Normal appendix. No pericecal inflammation. 3. No small bowel obstruction.  No free abdominal air. 4. No hydronephrosis or hydroureter. Electronically Signed   By: LiLahoma Crocker.D.   On: 12/07/2014 18:20    Scheduled Meds: . ciprofloxacin  400 mg Intravenous Q12H  . folic acid  1 mg Oral Daily  . heparin  5,000 Units Subcutaneous 3 times per day  . metronidazole  500 mg Intravenous Q8H  . multivitamin with minerals  1 tablet Oral Daily  . potassium chloride  10 mEq Intravenous Q1 Hr x 4  . thiamine  100 mg Oral Daily   Continuous Infusions: . dextrose 5 % and 0.9 % NaCl with KCl 20 mEq/L      Principal Problem:   Colitis Active Problems:   Hyponatremia   Acute colitis   Diarrhea of presumed infectious origin   Blood in  stool    Time spent: 35 minutes.     ReNiel Hummer  [facility] Pager [phone] 7PM-7AM, please contact night-coverage at [email or web address], password [email or web address]0/13/2016, 10:35 AM  LOS: 2 days

## 2014-12-10 ENCOUNTER — Encounter (HOSPITAL_COMMUNITY): Payer: Self-pay | Admitting: Gastroenterology

## 2014-12-10 ENCOUNTER — Encounter (HOSPITAL_COMMUNITY)
Admission: EM | Disposition: A | Discharge status: Home / Self Care | Payer: Self-pay | Source: Home / Self Care | Attending: Internal Medicine

## 2014-12-10 HISTORY — PX: FLEXIBLE SIGMOIDOSCOPY: SHX5431

## 2014-12-10 LAB — BASIC METABOLIC PANEL
ANION GAP: 4 — AB (ref 5–15)
BUN: <5 mg/dL — ABNORMAL LOW (ref 6–20)
CO2: 26 mmol/L (ref 22–32)
Calcium: 7.9 mg/dL — ABNORMAL LOW (ref 8.9–10.3)
Chloride: 106 mmol/L (ref 101–111)
Creatinine, Ser: 0.41 mg/dL — ABNORMAL LOW (ref 0.44–1.00)
GFR calc Af Amer: >60 mL/min (ref >60)
Glucose, Bld: 143 mg/dL — ABNORMAL HIGH (ref 65–99)
POTASSIUM: 3.1 mmol/L — AB (ref 3.5–5.1)
Sodium: 136 mmol/L (ref 135–145)

## 2014-12-10 LAB — GLUCOSE, CAPILLARY: Glucose-Capillary: 141 mg/dL — ABNORMAL HIGH (ref 65–99)

## 2014-12-10 LAB — T4, FREE: FREE T4: 1.18 ng/dL — AB (ref 0.61–1.12)

## 2014-12-10 LAB — CBC
HCT: 32.3 % — ABNORMAL LOW (ref 36.0–46.0)
HEMOGLOBIN: 11.2 g/dL — AB (ref 12.0–15.0)
MCH: 31.7 pg (ref 26.0–34.0)
MCHC: 34.7 g/dL (ref 30.0–36.0)
MCV: 91.5 fL (ref 78.0–100.0)
PLATELETS: 271 10*3/uL (ref 150–400)
RBC: 3.53 MIL/uL — AB (ref 3.87–5.11)
RDW: 12.8 % (ref 11.5–15.5)
WBC: 7.2 10*3/uL (ref 4.0–10.5)

## 2014-12-10 LAB — C-REACTIVE PROTEIN: CRP: 20.5 mg/dL — ABNORMAL HIGH (ref <1.0)

## 2014-12-10 LAB — SEDIMENTATION RATE: SED RATE: 75 mm/h — AB (ref 0–22)

## 2014-12-10 SURGERY — SIGMOIDOSCOPY, FLEXIBLE
Anesthesia: Moderate Sedation

## 2014-12-10 MED ORDER — FENTANYL CITRATE (PF) 100 MCG/2ML IJ SOLN
INTRAMUSCULAR | Status: AC
  Filled 2014-12-10: qty 2

## 2014-12-10 MED ORDER — SODIUM CHLORIDE 0.9 % IV SOLN: INTRAVENOUS | Status: DC

## 2014-12-10 MED ORDER — HYDROMORPHONE HCL 1 MG/ML IJ SOLN
0.5000 mg | Freq: Once | INTRAMUSCULAR | Status: AC
  Administered 2014-12-10: 0.5 mg via INTRAVENOUS
  Filled 2014-12-10: qty 1

## 2014-12-10 MED ORDER — FENTANYL CITRATE (PF) 100 MCG/2ML IJ SOLN
INTRAMUSCULAR | Status: DC | PRN
  Administered 2014-12-10 (×3): 25 ug via INTRAVENOUS
  Administered 2014-12-10 (×2): 12.5 ug via INTRAVENOUS

## 2014-12-10 MED ORDER — MAGNESIUM SULFATE 2 GM/50ML IV SOLN
2.0000 g | Freq: Once | INTRAVENOUS | Status: AC
  Administered 2014-12-10: 2 g via INTRAVENOUS
  Filled 2014-12-10: qty 50

## 2014-12-10 MED ORDER — MIDAZOLAM HCL 5 MG/ML IJ SOLN
INTRAMUSCULAR | Status: AC
  Filled 2014-12-10: qty 2

## 2014-12-10 MED ORDER — METHYLPREDNISOLONE SODIUM SUCC 40 MG IJ SOLR
40.0000 mg | Freq: Every day | INTRAMUSCULAR | Status: DC
  Administered 2014-12-10 – 2014-12-11 (×2): 40 mg via INTRAVENOUS
  Filled 2014-12-10 (×3): qty 1

## 2014-12-10 MED ORDER — POTASSIUM CHLORIDE CRYS ER 20 MEQ PO TBCR
40.0000 meq | EXTENDED_RELEASE_TABLET | Freq: Once | ORAL | Status: AC
  Administered 2014-12-10: 40 meq via ORAL
  Filled 2014-12-10: qty 2

## 2014-12-10 MED ORDER — DIPHENHYDRAMINE HCL 50 MG/ML IJ SOLN
INTRAMUSCULAR | Status: DC | PRN
  Administered 2014-12-10: 12.5 mg via INTRAVENOUS

## 2014-12-10 MED ORDER — DIPHENHYDRAMINE HCL 50 MG/ML IJ SOLN
INTRAMUSCULAR | Status: AC
  Filled 2014-12-10: qty 1

## 2014-12-10 MED ORDER — POTASSIUM CHLORIDE 10 MEQ/100ML IV SOLN
10.0000 meq | INTRAVENOUS | Status: AC
  Administered 2014-12-10 (×2): 10 meq via INTRAVENOUS
  Filled 2014-12-10 (×3): qty 100

## 2014-12-10 MED ORDER — MIDAZOLAM HCL 10 MG/2ML IJ SOLN
INTRAMUSCULAR | Status: DC | PRN
  Administered 2014-12-10 (×3): 2 mg via INTRAVENOUS
  Administered 2014-12-10 (×2): 1 mg via INTRAVENOUS

## 2014-12-10 NOTE — Progress Notes (Signed)
Date: December 10, 2014 Chart reviewed for concurrent status and case management needs. Will continue to follow patient for changes and needs: Mariesha Venturella, RN, BSN, CCM   [phone] 

## 2014-12-10 NOTE — Progress Notes (Signed)
Patient for flex sig today, this morning, to evaluate bloody diarrhea/abdominal pain and left sided colitis seen on CT scan.  K+ still somewhat low this AM, likely from GI losses, so 3 more runs of IV K+ as well as a dose of PO K+ have been ordered.  Stool for O&P is negative but does show abundant WBC's; stool culture preliminarily negative.

## 2014-12-10 NOTE — Progress Notes (Signed)
PHARMACY NOTE -  Ciprofloxacin  Pharmacy has been assisting with dosing of Ciprofloxacin for acute colitis. Dosage remains stable at 400 mg IV q12h and need for further dosage adjustment appears unlikely at present, as SCr is stable with CrCl > 100 ml/min.  Once clinically appropriate to change to PO antibiotics, appropriate dose of Cipro would be 500 mg PO BID.  Will sign off at this time.  Please reconsult if a change in clinical status warrants re-evaluation of dosage.   Greer PickerelJigna Cerra Eisenhower, PharmD, BCPS Pager: [phone] 12/10/2014 1:30 PM

## 2014-12-10 NOTE — Op Note (Signed)
[facility] [place], [address]   FLEXIBLE SIGMOIDOSCOPY PROCEDURE REPORT  PATIENT: Nicole, Reeves  MR#: [id number] BIRTHDATE: [birth year] , 29  yrs. old GENDER: female ENDOSCOPIST: Harl Bowie, MD REFERRED BY: PROCEDURE DATE:  12/10/2014 PROCEDURE:   Sigmoidoscopy with biopsy ASA CLASS:   Class II INDICATIONS:abdominal pain in the lower left quadrant.   an abnormal CT.   unexplained diarrhea. MEDICATIONS: Benadryl 12.5 mg IV, Fentanyl 100 mcg IV, and Versed 8 mg IV  DESCRIPTION OF PROCEDURE:   After the risks benefits and alternatives of the procedure were thoroughly explained, informed consent was obtained.  Digital exam revealed no abnormalities of the rectum. The Pentax peds colonoscope [id number]  endoscope was introduced through the anus  and advanced to the sigmoid colon , The exam was Without limitations.     The overall prep quality was adequate. . Estimated blood loss is zero unless otherwise noted in this procedure report. The instrument was then slowly withdrawn as the mucosa was fully examined.  COLON FINDINGS: Severe colitis of sigmoid colon with extensive ulceration of the mucosa, scope was not advanced beyond sigmoid colon, there was relative sparring of rectum with few erosions and erythema.  Multiple random biopsies were taken from sigmoid colon and rectum.    Retroflexed views revealed No hemorrhoids or fissure.    The scope was then withdrawn from the patient and the procedure terminated.  COMPLICATIONS: There were no immediate complications.  ENDOSCOPIC IMPRESSION: Severe colitis of sigmoid colon with extensive ulceration of the mucosa Relative sparring of rectum with few erosions and erythema. Differential includes inflammatory bowel disease vs less likely CMV colitis  RECOMMENDATIONS: Await biopsy results Will start IV steroids Continue Cipro and flagyl Will follow up Crohn's serology Check ESR/CRP,  Quantiferon TB, Hep A/B serology    REPEAT EXAM: Colonoscopy after resolution of acute colitis to determine the extent of disease  eSigned:  Harl Bowie, MD 12/10/2014 11:28 AM

## 2014-12-10 NOTE — Progress Notes (Signed)
[facility] PROGRESS NOTE  Nicole Reeves GUY:[id number] DOB: [birth year] DOA: 12/07/2014 PCP: No PCP Per Patient  Assessment/Plan: 1-Acute colitis;  Continue with ciprofloxacin and Flagyl. Depending on sigmoidoscopy  result could consider changing IV antibiotics due to fever.  Stool culture, ova and  parasite pending.   C diff negative.  S/p sigmoidoscopy: showed severe colitis, ulceration. Awaiting pathology.  Hepatitis, C reactive protein, ESR, Quantiferon b  HIV negative  2-Dysuria;   UA negative for infection.   3-Metabolic acidosis, hyponatremia; in setting of diarrhea. Continue with  IV fluids.  Improved.   4-Hypokalemia; replete with 3 runs, oral supplement. Replete mg.   5-Elevated TSH; check free T 3 and T 4 1.18 pending  Code Status: full code. Family Communication: care discussed with patient.  Disposition Plan: Remain inpatient.    Consultants:  GI  Procedures:  none  Antibiotics:  Cipro 10-12  Flagyl 10-12  HPI/Subjective: Sleepy, but wake up and answer questions. Just came from endo.  Still with abdominal pain  Objective: Filed Vitals:   12/10/14 1140  BP: 121/70  Pulse: 106  Temp:   Resp: 19    Intake/Output Summary (Last 24 hours) at 12/10/14 1208 Last data filed at 12/10/14 1021  Gross per 24 hour  Intake 3470.83 ml  Output      0 ml  Net 3470.83 ml   Filed Weights   12/08/14 0500 12/09/14 0525 12/10/14 1016  Weight: 70.3 kg (154 lb 15.7 oz) 71.7 kg (158 lb 1.1 oz) 71.668 kg (158 lb)    Exam:   General: NAD  Cardiovascular: S 1, S 2 RRR  Respiratory: CTA  Abdomen: Bs present, soft, mild tenderness  Musculoskeletal: no edema  Data Reviewed: Basic Metabolic Panel:  Recent Labs Lab 12/07/14 1650 12/08/14 0610 12/09/14 0540 12/09/14 1956 12/10/14 0544  NA 132* 133* 133* 136 136  K 3.7 3.7 2.8* 3.2* 3.1*  CL 104 105 103 106 106  CO2 15* 17* 21* 24 26  GLUCOSE 77 93 107* 128* 143*  BUN 6 <5* <5* <5* <5*   CREATININE 0.54 0.54 0.45 0.39* 0.41*  CALCIUM 8.8* 8.4* 7.9* 8.0* 7.9*  MG  --   --  1.7  --   --    Liver Function Tests:  Recent Labs Lab 12/05/14 1850 12/07/14 1650 12/08/14 0610  AST 20 20 21   ALT 59* 43 37  ALKPHOS 53 73 95  BILITOT 0.7 0.9 0.9  PROT 7.4 8.1 6.7  ALBUMIN 3.5 3.4* 2.9*    Recent Labs Lab 12/05/14 1850 12/07/14 1650  LIPASE 19* 29   No results for input(s): AMMONIA in the last 168 hours. CBC:  Recent Labs Lab 12/05/14 1850 12/07/14 1650 12/08/14 0610 12/09/14 0540 12/10/14 0544  WBC 8.9 9.5 8.4 7.1 7.2  NEUTROABS  --  6.7  --   --   --   HGB 14.0 13.4 12.6 12.2 11.2*  HCT 41.3 39.0 36.7 34.7* 32.3*  MCV 95.4 94.2 93.6 91.8 91.5  PLT 273 332 298 287 271   Cardiac Enzymes: No results for input(s): CKTOTAL, CKMB, CKMBINDEX, TROPONINI in the last 168 hours. BNP (last 3 results) No results for input(s): BNP in the last 8760 hours.  ProBNP (last 3 results) No results for input(s): PROBNP in the last 8760 hours.  CBG:  Recent Labs Lab 12/08/14 0755 12/10/14 0757  GLUCAP 88 141*    Recent Results (from the past 240 hour(s))  Urine culture     Status: None  Collection Time: 12/05/14  6:15 PM  Result Value Ref Range Status   Specimen Description URINE, CATHETERIZED  Final   Special Requests NONE  Final   Culture   Final    NO GROWTH 2 DAYS Performed at [facility]    Report Status 12/07/2014 FINAL  Final  C difficile quick scan w PCR reflex     Status: None   Collection Time: 12/08/14 12:24 AM  Result Value Ref Range Status   C Diff antigen NEGATIVE NEGATIVE Final   C Diff toxin NEGATIVE NEGATIVE Final   C Diff interpretation Negative for toxigenic C. difficile  Final  Ova and parasite examination     Status: None   Collection Time: 12/08/14 12:24 AM  Result Value Ref Range Status   Specimen Description STOOL  Final   Special Requests NONE  Final   Ova and parasites   Final    NO OVA OR PARASITES SEEN ABUNDANT  WBC Performed at [facility]    Report Status 12/09/2014 FINAL  Final  Stool culture     Status: None (Preliminary result)   Collection Time: 12/08/14 12:24 AM  Result Value Ref Range Status   Specimen Description STOOL  Final   Special Requests NONE  Final   Culture   Final    NO SUSPICIOUS COLONIES, CONTINUING TO HOLD Note: REDUCED NORMAL FLORA PRESENT Performed at [facility]    Report Status PENDING  Incomplete     Studies: No results found.  Scheduled Meds: . ciprofloxacin  400 mg Intravenous Q12H  . folic acid  1 mg Oral Daily  . heparin  5,000 Units Subcutaneous 3 times per day  . methylPREDNISolone (SOLU-MEDROL) injection  40 mg Intravenous Daily  . metronidazole  500 mg Intravenous Q8H  . multivitamin with minerals  1 tablet Oral Daily  . potassium chloride  10 mEq Intravenous Q1 Hr x 3  . thiamine  100 mg Oral Daily   Continuous Infusions: . sodium chloride    . dextrose 5 % and 0.9 % NaCl with KCl 20 mEq/L 125 mL/hr at 12/10/14 0028    Principal Problem:   Colitis Active Problems:   Hyponatremia   Acute colitis   Diarrhea of presumed infectious origin   Blood in stool    Time spent: 35 minutes.     Niel Hummer A  [facility] Pager [phone]. If 7PM-7AM, please contact night-coverage at [email or web address], password [facility] 12/10/2014, 12:08 PM  LOS: 3 days

## 2014-12-11 DIAGNOSIS — R197 Diarrhea, unspecified: Secondary | ICD-10-CM | POA: Insufficient documentation

## 2014-12-11 LAB — BASIC METABOLIC PANEL
ANION GAP: 7 (ref 5–15)
BUN: <5 mg/dL — ABNORMAL LOW (ref 6–20)
CALCIUM: 8.2 mg/dL — AB (ref 8.9–10.3)
CHLORIDE: 109 mmol/L (ref 101–111)
CO2: 24 mmol/L (ref 22–32)
Creatinine, Ser: <0.3 mg/dL — ABNORMAL LOW (ref 0.44–1.00)
GLUCOSE: 189 mg/dL — AB (ref 65–99)
POTASSIUM: 4.1 mmol/L (ref 3.5–5.1)
Sodium: 140 mmol/L (ref 135–145)

## 2014-12-11 LAB — HEPATITIS B SURFACE ANTIGEN: Hepatitis B Surface Ag: NEGATIVE

## 2014-12-11 LAB — GLUCOSE, CAPILLARY: Glucose-Capillary: 178 mg/dL — ABNORMAL HIGH (ref 65–99)

## 2014-12-11 LAB — HEPATITIS B SURFACE ANTIBODY,QUALITATIVE: Hep B S Ab: REACTIVE

## 2014-12-11 LAB — T3, FREE: T3 FREE: 2.4 pg/mL (ref 2.0–4.4)

## 2014-12-11 LAB — HEPATITIS A ANTIBODY, TOTAL: Hep A Total Ab: NEGATIVE

## 2014-12-11 MED ORDER — SODIUM CHLORIDE 0.9 % IV SOLN
INTRAVENOUS | Status: DC
  Administered 2014-12-11 – 2014-12-13 (×4): via INTRAVENOUS

## 2014-12-11 NOTE — Progress Notes (Signed)
[facility] PROGRESS NOTE  Nicole Reeves ZMC:[id number] DOB: May 22, 1985 DOA: 12/07/2014 PCP: No PCP Per Patient  Assessment/Plan: 1-Acute colitis;  Continue with ciprofloxacin and Flagyl. Depending on sigmoidoscopy  result could consider changing IV antibiotics due to fever.  Stool culture, ova and  parasite pending.   C diff negative.  S/p sigmoidoscopy: showed severe colitis, ulceration. Awaiting pathology.  Hepatitis panel negative, C reactive protein, ESR elevated , Quantiferon b  HIV negative Improving on IV steroids. Advance diet today   2-Dysuria;   UA negative for infection.   3-Metabolic acidosis, hyponatremia; in setting of diarrhea. Continue with  IV fluids.  Improved.   4-Hypokalemia; resolved.   5-Elevated TSH; check free T 3 NL. and T 4 1.18 needs repeat TSH in 4 weeks.   Code Status: full code. Family Communication: care discussed with patient.  Disposition Plan: Remain inpatient.    Consultants:  GI  Procedures:  none  Antibiotics:  Cipro 10-12  Flagyl 10-12  HPI/Subjective: Pain is better, diarrhea has improved  Objective: Filed Vitals:   12/11/14 0621  BP: 102/58  Pulse: 68  Temp: 97.9 F (36.6 C)  Resp: 20   No intake or output data in the 24 hours ending 12/11/14 1218 Filed Weights   12/08/14 0500 12/09/14 0525 12/10/14 1016  Weight: 70.3 kg (154 lb 15.7 oz) 71.7 kg (158 lb 1.1 oz) 71.668 kg (158 lb)    Exam:   General: NAD  Cardiovascular: S 1, S 2 RRR  Respiratory: CTA  Abdomen: Bs present, soft, mild tenderness  Musculoskeletal: no edema  Data Reviewed: Basic Metabolic Panel:  Recent Labs Lab 12/08/14 0610 12/09/14 0540 12/09/14 1956 12/10/14 0544 12/11/14 0553  NA 133* 133* 136 136 140  K 3.7 2.8* 3.2* 3.1* 4.1  CL 105 103 106 106 109  CO2 17* 21* _0 GLUCOSE 93 107* 128* 143* 189*  BUN <5* <5* <5* <5* <5*  CREATININE 0.54 0.45 0.39* 0.41* <0.30*  CALCIUM 8.4* 7.9* 8.0* 7.9* 8.2*  MG   --  1.7  --   --   --    Liver Function Tests:  Recent Labs Lab 12/05/14 1850 12/07/14 1650 12/08/14 0610  AST _1 ALT 59* 43 37  ALKPHOS 53 73 95  BILITOT 0.7 0.9 0.9  PROT 7.4 8.1 6.7  ALBUMIN 3.5 3.4* 2.9*    Recent Labs Lab 12/05/14 1850 12/07/14 1650  LIPASE 19* 29   No results for input(s): AMMONIA in the last 168 hours. CBC:  Recent Labs Lab 12/05/14 1850 12/07/14 1650 12/08/14 0610 12/09/14 0540 12/10/14 0544  WBC 8.9 9.5 8.4 7.1 7.2  NEUTROABS  --  6.7  --   --   --   HGB 14.0 13.4 12.6 12.2 11.2*  HCT 41.3 39.0 36.7 34.7* 32.3*  MCV 95.4 94.2 93.6 91.8 91.5  PLT 273 332 298 287 271   Cardiac Enzymes: No results for input(s): CKTOTAL, CKMB, CKMBINDEX, TROPONINI in the last 168 hours. BNP (last 3 results) No results for input(s): BNP in the last 8760 hours.  ProBNP (last 3 results) No results for input(s): PROBNP in the last 8760 hours.  CBG:  Recent Labs Lab 12/08/14 0755 12/10/14 0757 12/11/14 0754  GLUCAP 88 141* 178*    Recent Results (from the past 240 hour(s))  Urine culture     Status: None   Collection Time: 12/05/14  6:15 PM  Result Value Ref Range Status   Specimen Description URINE, CATHETERIZED  Final   Special Requests NONE  Final   Culture   Final    NO GROWTH 2 DAYS Performed at [facility]    Report Status 12/07/2014 FINAL  Final  C difficile quick scan w PCR reflex     Status: None   Collection Time: 12/08/14 12:24 AM  Result Value Ref Range Status   C Diff antigen NEGATIVE NEGATIVE Final   C Diff toxin NEGATIVE NEGATIVE Final   C Diff interpretation Negative for toxigenic C. difficile  Final  Ova and parasite examination     Status: None   Collection Time: 12/08/14 12:24 AM  Result Value Ref Range Status   Specimen Description STOOL  Final   Special Requests NONE  Final   Ova and parasites   Final    NO OVA OR PARASITES SEEN ABUNDANT WBC Performed at [facility]    Report Status  12/09/2014 FINAL  Final  Stool culture     Status: None (Preliminary result)   Collection Time: 12/08/14 12:24 AM  Result Value Ref Range Status   Specimen Description STOOL  Final   Special Requests NONE  Final   Culture   Final    NO SUSPICIOUS COLONIES, CONTINUING TO HOLD Note: REDUCED NORMAL FLORA PRESENT Performed at [facility]    Report Status PENDING  Incomplete  Culture, blood (routine x 2)     Status: None (Preliminary result)   Collection Time: 12/09/14  7:20 AM  Result Value Ref Range Status   Specimen Description BLOOD LEFT ARM  Final   Special Requests BOTTLES DRAWN AEROBIC ONLY 3CC  Final   Culture   Final    NO GROWTH 1 DAY Performed at [facility]    Report Status PENDING  Incomplete  Culture, blood (routine x 2)     Status: None (Preliminary result)   Collection Time: 12/09/14  7:25 AM  Result Value Ref Range Status   Specimen Description BLOOD LEFT HAND  Final   Special Requests BOTTLES DRAWN AEROBIC AND ANAEROBIC 5CC  Final   Culture   Final    NO GROWTH 1 DAY Performed at [facility]    Report Status PENDING  Incomplete     Studies: No results found.  Scheduled Meds: . ciprofloxacin  400 mg Intravenous Q12H  . folic acid  1 mg Oral Daily  . heparin  5,000 Units Subcutaneous 3 times per day  . methylPREDNISolone (SOLU-MEDROL) injection  40 mg Intravenous Daily  . metronidazole  500 mg Intravenous Q8H  . multivitamin with minerals  1 tablet Oral Daily  . thiamine  100 mg Oral Daily   Continuous Infusions: . sodium chloride    . dextrose 5 % and 0.9 % NaCl with KCl 20 mEq/L 125 mL/hr at 12/11/14 8916    Principal Problem:   Colitis Active Problems:   Hyponatremia   Acute colitis   Diarrhea of presumed infectious origin   Blood in stool    Time spent: 35 minutes.     Niel Hummer A  [facility] Pager [phone]. If 7PM-7AM, please contact night-coverage at [email or web address], password [facility] 12/11/2014,  12:18 PM  LOS: 4 days

## 2014-12-11 NOTE — Progress Notes (Signed)
     [facility] Progress Note  Subjective:  CRP 20.5 and ESR 75.  Feeling somewhat better today.  Pain is better.  Only one BM this AM and alst before that was at 11 pm last night.    Objective:  Vital signs in last 24 hours: Temp:  [97.9 F (36.6 C)-99.6 F (37.6 C)] 97.9 F (36.6 C) (10/15 0621) Pulse Rate:  [68-106] 68 (10/15 0621) Resp:  [14-27] 20 (10/15 0621) BP: (100-121)/(58-81) 102/58 mmHg (10/15 0621) SpO2:  [95 %-100 %] 97 % (10/15 0621) Weight:  [158 lb (71.668 kg)] 158 lb (71.668 kg) (10/14 1016) Last BM Date: 12/10/14 General:  Alert, Well-developed, in NAD Heart:  Regular rate and rhythm; no murmurs Pulm:  CTAB.  No W/R/R. Abdomen:  Soft, non-distended. Normal bowel sounds.  Mildly tender mostly in LLQ. Extremities:  Without edema. Neurologic:  Alert and  oriented x4;  grossly normal neurologically. Psych:  Alert and cooperative. Normal mood and affect.  Lab Results:  Recent Labs  12/09/14 0540 12/10/14 0544  WBC 7.1 7.2  HGB 12.2 11.2*  HCT 34.7* 32.3*  PLT 287 271   BMET  Recent Labs  12/09/14 1956 12/10/14 0544 12/11/14 0553  NA 136 136 140  K 3.2* 3.1* 4.1  CL 106 106 109  CO2 $Re'24 26 24  'pAJ$ GLUCOSE 128* 143* 189*  BUN <5* <5* <5*  CREATININE 0.39* 0.41* <0.30*  CALCIUM 8.0* 7.9* 8.2*   Hepatitis Panel  Recent Labs  12/10/14 1200  HEPBSAG Negative   Assessment / Plan: *Bloody diarrhea and abdominal pain with colitis on CT scan:  Cdiff and O&P negative.  Preliminary stool culture negative as well.  Flex sig 10/14 showed severe colitis with ulceration in sigmoid colon, relative rectal sparring.  Biopsies pending but IV steroids started at 40 mg daily.  Continue with conservative/supportive measures including IV antibiotics (on cipro and flagyl) for now as well, IVF's, pain control, etc.  Hep B shows immunity but will need Hep A vaccinations as outpatient.  Quantiferon gold pending.  CRP and ESR very high.  Anticipated that she  may need biologics.  Repeat CRP tomorrow. *Hypokalemia:  Improved/normal currently.  Continue to monitor.   LOS: 4 days   Khristin Keleher D.  12/11/2014, 9:23 AM  Pager number [phone]

## 2014-12-12 LAB — BASIC METABOLIC PANEL
Anion gap: 6 (ref 5–15)
BUN: 9 mg/dL (ref 6–20)
CALCIUM: 8.6 mg/dL — AB (ref 8.9–10.3)
CO2: 27 mmol/L (ref 22–32)
Chloride: 108 mmol/L (ref 101–111)
Creatinine, Ser: 0.36 mg/dL — ABNORMAL LOW (ref 0.44–1.00)
GFR calc Af Amer: >60 mL/min (ref >60)
GLUCOSE: 125 mg/dL — AB (ref 65–99)
Potassium: 4.3 mmol/L (ref 3.5–5.1)
Sodium: 141 mmol/L (ref 135–145)

## 2014-12-12 LAB — STOOL CULTURE

## 2014-12-12 LAB — CBC
HCT: 31.2 % — ABNORMAL LOW (ref 36.0–46.0)
Hemoglobin: 10.7 g/dL — ABNORMAL LOW (ref 12.0–15.0)
MCH: 32.5 pg (ref 26.0–34.0)
MCHC: 34.3 g/dL (ref 30.0–36.0)
MCV: 94.8 fL (ref 78.0–100.0)
PLATELETS: 316 10*3/uL (ref 150–400)
RBC: 3.29 MIL/uL — ABNORMAL LOW (ref 3.87–5.11)
RDW: 13.1 % (ref 11.5–15.5)
WBC: 11.8 10*3/uL — ABNORMAL HIGH (ref 4.0–10.5)

## 2014-12-12 LAB — C-REACTIVE PROTEIN: CRP: 6.9 mg/dL — ABNORMAL HIGH (ref <1.0)

## 2014-12-12 LAB — GLUCOSE, CAPILLARY: Glucose-Capillary: 115 mg/dL — ABNORMAL HIGH (ref 65–99)

## 2014-12-12 MED ORDER — CIPROFLOXACIN HCL 500 MG PO TABS
500.0000 mg | ORAL_TABLET | Freq: Two times a day (BID) | ORAL | Status: DC
  Administered 2014-12-12 – 2014-12-13 (×3): 500 mg via ORAL
  Filled 2014-12-12 (×5): qty 1

## 2014-12-12 MED ORDER — METRONIDAZOLE 250 MG PO TABS
250.0000 mg | ORAL_TABLET | Freq: Three times a day (TID) | ORAL | Status: DC
  Administered 2014-12-12 – 2014-12-13 (×3): 250 mg via ORAL
  Filled 2014-12-12 (×6): qty 1

## 2014-12-12 MED ORDER — PREDNISONE 20 MG PO TABS
40.0000 mg | ORAL_TABLET | Freq: Every day | ORAL | Status: DC
  Administered 2014-12-13: 40 mg via ORAL
  Filled 2014-12-12 (×2): qty 2

## 2014-12-12 NOTE — Progress Notes (Signed)
     [facility] Progress Note  Subjective:  Feels better.  Just tired.  Had about 5 BM's yesterday, which is much improved.  No bleeding.  Only has pain when has BM but otherwise no pain.  Objective:  Vital signs in last 24 hours: Temp:  [97.6 F (36.4 C)-98.1 F (36.7 C)] 97.6 F (36.4 C) (10/16 0500) Pulse Rate:  [63-74] 63 (10/16 0500) Resp:  [18] 18 (10/16 0500) BP: (93-106)/(63-67) 93/65 mmHg (10/16 0500) SpO2:  [98 %-100 %] 100 % (10/16 0500) Weight:  [162 lb 11.2 oz (73.8 kg)] 162 lb 11.2 oz (73.8 kg) (10/16 0500) Last BM Date: 12/10/14 (per pt) General:  Alert, Well-developed, in NAD Heart:  Regular rate and rhythm; no murmurs Pulm:  CTAB.  No W/R/R. Abdomen:  Soft, non-distended. Normal bowel sounds.  Minimal to no TTP. Extremities:  Without edema. Neurologic:  Alert and  oriented x4;  grossly normal neurologically. Psych:  Alert and cooperative. Normal mood and affect.  Intake/Output from previous day: 10/15 0701 - 10/16 0700 In: 640 [P.O.:640] Out: 2 [Urine:2]   Lab Results:  Recent Labs  12/10/14 0544 12/12/14 0524  WBC 7.2 11.8*  HGB 11.2* 10.7*  HCT 32.3* 31.2*  PLT 271 316   BMET  Recent Labs  12/10/14 0544 12/11/14 0553 12/12/14 0524  NA 136 140 141  K 3.1* 4.1 4.3  CL 106 109 108  CO2 $Re'26 24 27  'QIO$ GLUCOSE 143* 189* 125*  BUN <5* <5* 9  CREATININE 0.41* <0.30* 0.36*  CALCIUM 7.9* 8.2* 8.6*   Hepatitis Panel  Recent Labs  12/10/14 1200  HEPBSAG Negative   Assessment / Plan: *Bloody diarrhea and abdominal pain with colitis on CT scan: Cdiff and O&P negative. Preliminary stool culture negative as well. Flex sig 10/14 showed severe colitis with ulceration in sigmoid colon, relative rectal sparring. Biopsies pending but IV steroids started at 40 mg daily.  On IV antibiotics (cipro and flagyl) as well.  Hep B shows immunity but will need Hep A vaccinations as outpatient. Quantiferon gold pending. CRP and ESR very high  initially but improved/lower today. Anticipate that she may need biologics.  Will change to PO antibiotics and steroids today and monitor for a day or two on those. *Hypokalemia: Improved/normal currently. Continue to monitor.   LOS: 5 days   Nicole Reeves D.  12/12/2014, 8:57 AM  Pager number [phone]

## 2014-12-12 NOTE — Progress Notes (Signed)
[facility] PROGRESS NOTE  Nicole Reeves DPO:[id number] DOB: 1985-06-28 DOA: 12/07/2014 PCP: No PCP Per Patient  Assessment/Plan: 1-Acute colitis;  Continue with ciprofloxacin and Flagyl day 5 Stool culture, ova and  parasite pending.   C diff negative.  S/p sigmoidoscopy: showed severe colitis, ulceration. Awaiting pathology.  Hepatitis panel negative, C reactive protein, ESR elevated , Quantiferon b  HIV negative.  IV solumedrol change to oral.   2-Dysuria;   UA negative for infection.   3-Metabolic acidosis, hyponatremia; in setting of diarrhea. Continue with  IV fluids.  Improved.   4-Hypokalemia; resolved.   5-Elevated TSH; check free T 3 NL. and T 4 1.18 needs repeat TSH in 4 weeks.   Code Status: full code. Family Communication: care discussed with patient.  Disposition Plan: Remain inpatient.    Consultants:  GI  Procedures:  Sigmoidoscopy   Antibiotics:  Cipro 10-12  Flagyl 10-12  HPI/Subjective: Abdominal pain only when having BM, pain is better. Diarrhea is less frequent  Objective: Filed Vitals:   12/12/14 1400  BP: 101/57  Pulse: 57  Temp: 97.7 F (36.5 C)  Resp: 18    Intake/Output Summary (Last 24 hours) at 12/12/14 1501 Last data filed at 12/11/14 2137  Gross per 24 hour  Intake    240 ml  Output      0 ml  Net    240 ml   Filed Weights   12/09/14 0525 12/10/14 1016 12/12/14 0500  Weight: 71.7 kg (158 lb 1.1 oz) 71.668 kg (158 lb) 73.8 kg (162 lb 11.2 oz)    Exam:   General: NAD  Cardiovascular: S 1, S 2 RRR  Respiratory: CTA  Abdomen: Bs present, soft, mild tenderness  Musculoskeletal: no edema  Data Reviewed: Basic Metabolic Panel:  Recent Labs Lab 12/09/14 0540 12/09/14 1956 12/10/14 0544 12/11/14 0553 12/12/14 0524  NA 133* 136 136 140 141  K 2.8* 3.2* 3.1* 4.1 4.3  CL 103 106 106 109 108  CO2 21* _0 GLUCOSE 107* 128* 143* 189* 125*  BUN <5* <5* <5* <5* 9  CREATININE 0.45 0.39*  0.41* <0.30* 0.36*  CALCIUM 7.9* 8.0* 7.9* 8.2* 8.6*  MG 1.7  --   --   --   --    Liver Function Tests:  Recent Labs Lab 12/05/14 1850 12/07/14 1650 12/08/14 0610  AST _1 ALT 59* 43 37  ALKPHOS 53 73 95  BILITOT 0.7 0.9 0.9  PROT 7.4 8.1 6.7  ALBUMIN 3.5 3.4* 2.9*    Recent Labs Lab 12/05/14 1850 12/07/14 1650  LIPASE 19* 29   No results for input(s): AMMONIA in the last 168 hours. CBC:  Recent Labs Lab 12/07/14 1650 12/08/14 0610 12/09/14 0540 12/10/14 0544 12/12/14 0524  WBC 9.5 8.4 7.1 7.2 11.8*  NEUTROABS 6.7  --   --   --   --   HGB 13.4 12.6 12.2 11.2* 10.7*  HCT 39.0 36.7 34.7* 32.3* 31.2*  MCV 94.2 93.6 91.8 91.5 94.8  PLT 332 298 287 271 316   Cardiac Enzymes: No results for input(s): CKTOTAL, CKMB, CKMBINDEX, TROPONINI in the last 168 hours. BNP (last 3 results) No results for input(s): BNP in the last 8760 hours.  ProBNP (last 3 results) No results for input(s): PROBNP in the last 8760 hours.  CBG:  Recent Labs Lab 12/08/14 0755 12/10/14 0757 12/11/14 0754 12/12/14 0739  GLUCAP 88 141* 178* 115*    Recent Results (from the past 240  hour(s))  Urine culture     Status: None   Collection Time: 12/05/14  6:15 PM  Result Value Ref Range Status   Specimen Description URINE, CATHETERIZED  Final   Special Requests NONE  Final   Culture   Final    NO GROWTH 2 DAYS Performed at [facility]    Report Status 12/07/2014 FINAL  Final  C difficile quick scan w PCR reflex     Status: None   Collection Time: 12/08/14 12:24 AM  Result Value Ref Range Status   C Diff antigen NEGATIVE NEGATIVE Final   C Diff toxin NEGATIVE NEGATIVE Final   C Diff interpretation Negative for toxigenic C. difficile  Final  Ova and parasite examination     Status: None   Collection Time: 12/08/14 12:24 AM  Result Value Ref Range Status   Specimen Description STOOL  Final   Special Requests NONE  Final   Ova and parasites   Final    NO OVA OR  PARASITES SEEN ABUNDANT WBC Performed at [facility]    Report Status 12/09/2014 FINAL  Final  Stool culture     Status: None (Preliminary result)   Collection Time: 12/08/14 12:24 AM  Result Value Ref Range Status   Specimen Description STOOL  Final   Special Requests NONE  Final   Culture   Final    NO SUSPICIOUS COLONIES, CONTINUING TO HOLD Note: REDUCED NORMAL FLORA PRESENT Performed at [facility]    Report Status PENDING  Incomplete  Culture, blood (routine x 2)     Status: None (Preliminary result)   Collection Time: 12/09/14  7:20 AM  Result Value Ref Range Status   Specimen Description BLOOD LEFT ARM  Final   Special Requests BOTTLES DRAWN AEROBIC ONLY 3CC  Final   Culture   Final    NO GROWTH 3 DAYS Performed at [facility]    Report Status PENDING  Incomplete  Culture, blood (routine x 2)     Status: None (Preliminary result)   Collection Time: 12/09/14  7:25 AM  Result Value Ref Range Status   Specimen Description BLOOD LEFT HAND  Final   Special Requests BOTTLES DRAWN AEROBIC AND ANAEROBIC 5CC  Final   Culture   Final    NO GROWTH 3 DAYS Performed at [facility]    Report Status PENDING  Incomplete     Studies: No results found.  Scheduled Meds: . ciprofloxacin  500 mg Oral BID  . folic acid  1 mg Oral Daily  . heparin  5,000 Units Subcutaneous 3 times per day  . metroNIDAZOLE  250 mg Oral 3 times per day  . multivitamin with minerals  1 tablet Oral Daily  . [START ON 12/13/2014] predniSONE  40 mg Oral QAC breakfast  . thiamine  100 mg Oral Daily   Continuous Infusions: . sodium chloride    . sodium chloride 100 mL/hr at 12/12/14 1250    Principal Problem:   Colitis Active Problems:   Hyponatremia   Acute colitis   Diarrhea of presumed infectious origin   Blood in stool   Diarrhea    Time spent: 35 minutes.     Niel Hummer A  [facility] Pager [phone]. If 7PM-7AM, please contact  night-coverage at [email or web address], password [facility] 12/12/2014, 3:01 PM  LOS: 5 days

## 2014-12-13 ENCOUNTER — Encounter (HOSPITAL_COMMUNITY): Payer: Self-pay | Admitting: Gastroenterology

## 2014-12-13 LAB — BASIC METABOLIC PANEL
ANION GAP: 6 (ref 5–15)
BUN: 9 mg/dL (ref 6–20)
CALCIUM: 8.1 mg/dL — AB (ref 8.9–10.3)
CO2: 29 mmol/L (ref 22–32)
Chloride: 106 mmol/L (ref 101–111)
Creatinine, Ser: 0.46 mg/dL (ref 0.44–1.00)
GFR calc Af Amer: >60 mL/min (ref >60)
GLUCOSE: 86 mg/dL (ref 65–99)
POTASSIUM: 3.8 mmol/L (ref 3.5–5.1)
SODIUM: 141 mmol/L (ref 135–145)

## 2014-12-13 LAB — GLUCOSE, CAPILLARY: GLUCOSE-CAPILLARY: 73 mg/dL (ref 65–99)

## 2014-12-13 MED ORDER — PREDNISONE 20 MG PO TABS
ORAL_TABLET | ORAL | Status: DC
Start: 2014-12-13 — End: 2015-03-14

## 2014-12-13 MED ORDER — DICYCLOMINE HCL 10 MG PO CAPS
10.0000 mg | ORAL_CAPSULE | Freq: Four times a day (QID) | ORAL | Status: DC | PRN
  Filled 2014-12-13: qty 1

## 2014-12-13 MED ORDER — DICYCLOMINE HCL 10 MG PO CAPS: 10.0000 mg | ORAL_CAPSULE | Freq: Four times a day (QID) | ORAL | Status: DC | PRN

## 2014-12-13 MED ORDER — ADULT MULTIVITAMIN W/MINERALS CH: 1.0000 | ORAL_TABLET | Freq: Every day | ORAL | Status: DC

## 2014-12-13 NOTE — Progress Notes (Signed)
Patient ID: Nicole MooresJacqueline Calica, female   DOB: 31-Oct-1985, 29 y.o.   MRN: [id number]     Progress Note   Subjective  Feeling better,wants to go home. Still having pain,cramoing primarily around  Bm's. No bleeding. Eating without difficulty. Bx-still pending-should be out today   Objective   Vital signs in last 24 hours: Temp:  [97.7 F (36.5 C)-98.2 F (36.8 C)] 98.2 F (36.8 C) (10/17 0519) Pulse Rate:  [57-71] 71 (10/17 0519) Resp:  [18] 18 (10/17 0519) BP: (99-101)/(57-69) 99/69 mmHg (10/17 0519) SpO2:  [97 %-100 %] 98 % (10/17 0519) Weight:  [167 lb 1.7 oz (75.8 kg)] 167 lb 1.7 oz (75.8 kg) (10/17 0519) Last BM Date: 12/12/14 (per pt) General: young    white female in NAD Heart:  Regular rate and rhythm; no murmurs Lungs: Respirations even and unlabored, lungs CTA bilaterally Abdomen:  Soft, mildly tender LMQ/LLQ, and nondistended. Normal bowel sounds. Extremities:  Without edema. Neurologic:  Alert and oriented,  grossly normal neurologically. Psych:  Cooperative. Normal mood and affect.  Intake/Output from previous day: 10/16 0701 - 10/17 0700 In: 680 [P.O.:680] Out: -  Intake/Output this shift:    Lab Results:  Recent Labs  12/12/14 0524  WBC 11.8*  HGB 10.7*  HCT 31.2*  PLT 316   BMET  Recent Labs  12/11/14 0553 12/12/14 0524 12/13/14 0600  NA 140 141 141  K 4.1 4.3 3.8  CL 109 108 106  CO2 24 27 29   GLUCOSE 189* 125* 86  BUN <5* 9 9  CREATININE <0.30* 0.36* 0.46  CALCIUM 8.2* 8.6* 8.1*   LFT No results for input(s): PROT, ALBUMIN, AST, ALT, ALKPHOS, BILITOT, BILIDIR, IBILI in the last 72 hours. PT/INR No results for input(s): LABPROT, INR in the last 72 hours.  Studies/Results: No results found.     Assessment / Plan:    #1 29 yo female with acute severe colitis-suspect IBD- infectious workup negative, and improving on steroids. Bx pending, hopefully out today. Will add bentyl 10 mg TID prn for cramping I think can be discharged  later today on prednisone 40 mg po Qam x 2 weeks, then 30 mg po daily x 2 weeks, then will taper further after seen in office Can D/C Cipro/Flagyl on discharge  Will discuss and arrange quick follow up in office.  She currently does not have insurance-encouraged to obtain.  Appt made -[facility] at 3 pm  Principal Problem:   Colitis Active Problems:   Hyponatremia   Acute colitis   Diarrhea of presumed infectious origin   Blood in stool   Diarrhea     LOS: 6 days   Marks Scalera  12/13/2014, 8:40 AM

## 2014-12-13 NOTE — Progress Notes (Signed)
Nicole MooresJacqueline Reeves to be D/C'd Home per MD order.  Discussed prescriptions and follow up appointments with the patient. Prescriptions given to patient, medication list explained in detail. Pt verbalized understanding.    Medication List    STOP taking these medications        ibuprofen 200 MG tablet  Commonly known as:  ADVIL,MOTRIN     levofloxacin 500 MG tablet  Commonly known as:  LEVAQUIN     loperamide 2 MG tablet  Commonly known as:  IMODIUM A-D      TAKE these medications        dicyclomine 10 MG capsule  Commonly known as:  BENTYL  Take 1 capsule (10 mg total) by mouth every 6 (six) hours as needed for spasms (cramping).     multivitamin with minerals Tabs tablet  Take 1 tablet by mouth daily.     predniSONE 20 MG tablet  Commonly known as:  DELTASONE  Take 40 mg daily for 2 weeks then 30 mg daily for 2 weeks, then follow up with Gastroenterologist for further instruction.        Filed Vitals:   12/13/14 0519  BP: 99/69  Pulse: 71  Temp: 98.2 F (36.8 C)  Resp: 18    Skin clean, dry and intact without evidence of skin break down, no evidence of skin tears noted. IV catheter discontinued intact. Site without signs and symptoms of complications. Dressing and pressure applied. Pt denies pain at this time. No complaints noted.  An After Visit Summary was printed and given to the patient. Patient escorted via WC, and D/C home via private auto.  Nicole Reeves, Nicole Reeves 12/13/2014 2:11 PM

## 2014-12-13 NOTE — Discharge Summary (Signed)
Physician Discharge Summary  Nicole Reeves WUJ:[id number] DOB: 09-17-1985 DOA: 12/07/2014  PCP: No PCP Per Patient  Admit date: 12/07/2014 Discharge date: 12/13/2014  Time spent: 35 minutes  Recommendations for Outpatient Follow-up:  Follow up results of pathology Needs repeat TSH and free T 3 and T 4.   Discharge Diagnoses:    Colitis    Hyponatremia   Acute colitis   Diarrhea of presumed infectious origin   Blood in stool   Diarrhea   Discharge Condition: stable.   Diet recommendation: Heart heal;thy  Filed Weights   12/10/14 1016 12/12/14 0500 12/13/14 0519  Weight: 71.668 kg (158 lb) 73.8 kg (162 lb 11.2 oz) 75.8 kg (167 lb 1.7 oz)    History of present illness:  Nicole Reeves is a 29 y.o. female with no prior history presents with abdominal pain. Patient has been having pain in her abdomen for about 8 days now. She has had diarrhea and also has been having cramping. Patient states that there has been blood in her diarrhea. Patient was seen and started on oral levaquin for a presumed bacterial in addition she has been taking imodium without much improvement. She denies recent travel. She has had no sick contacts. She states there is associated nausea and had vomiting last night. No fevers are noted. She states that she works at a [employer] and has been eating there. No one has been sick there that she is aware of. She has never had this type of diarrhea before. She states that one of her aunts might have [place] [facility] Course:  1-Acute colitis;  Continue with ciprofloxacin and Flagyl day 6. Discontinue antibiotics at discharge  Stool culture, ova and parasite pending.  C diff negative.  S/p sigmoidoscopy: showed severe colitis, ulceration. Awaiting pathology.  Hepatitis panel negative, C reactive protein, ESR elevated , Quantiferon b  HIV negative.  IV solumedrol change to oral.  Discharge on prednisone taper.   2-Dysuria;  UA negative for infection.    3-Metabolic acidosis, hyponatremia; in setting of diarrhea. Continue with IV fluids. Improved.   4-Hypokalemia; resolved.   5-Elevated TSH; check free T 3 NL. and T 4 1.18 needs repeat TSH in 4 weeks.   Procedures:  symoidoscopy  Consultations:  GI  Discharge Exam: Filed Vitals:   12/13/14 0519  BP: 99/69  Pulse: 71  Temp: 98.2 F (36.8 C)  Resp: 18    General: NAD Cardiovascular: S 1, S 2 RRR Respiratory: CTA Abdomen; soft, nt  Discharge Instructions   Discharge Instructions    Diet - low sodium heart healthy    Complete by:  As directed      Increase activity slowly    Complete by:  As directed           Current Discharge Medication List    START taking these medications   Details  dicyclomine (BENTYL) 10 MG capsule Take 1 capsule (10 mg total) by mouth every 6 (six) hours as needed for spasms (cramping). Qty: 30 capsule, Refills: 0    Multiple Vitamin (MULTIVITAMIN WITH MINERALS) TABS tablet Take 1 tablet by mouth daily. Qty: 30 tablet, Refills: 0    predniSONE (DELTASONE) 20 MG tablet Take 40 mg daily for 2 weeks then 30 mg daily for 2 weeks, then follow up with Gastroenterologist for further instruction. Qty: 60 tablet, Refills: 0      STOP taking these medications     ibuprofen (ADVIL,MOTRIN) 200 MG tablet      levofloxacin (LEVAQUIN) 500  MG tablet      loperamide (IMODIUM A-D) 2 MG tablet        No Known Allergies Follow-up Information    Follow up with Nicoletta Ba, PA-C On 12/21/2014.   Specialty:  Gastroenterology   Why:  at 3 pm   Contact information:   [place] [id number] [phone]        The results of significant diagnostics from this hospitalization (including imaging, microbiology, ancillary and laboratory) are listed below for reference.    Significant Diagnostic Studies: Ct Abdomen Pelvis W Contrast  12/07/2014  CLINICAL DATA:  Abdominal pain, treated for bacterial intestinal infection  Sunday, worsening nausea and vomiting, diarrhea EXAM: CT ABDOMEN AND PELVIS WITH CONTRAST TECHNIQUE: Multidetector CT imaging of the abdomen and pelvis was performed using the standard protocol following bolus administration of intravenous contrast. CONTRAST:  28m OMNIPAQUE IOHEXOL 300 MG/ML SOLN, 1073mOMNIPAQUE IOHEXOL 300 MG/ML SOLN COMPARISON:  02/24/2014 FINDINGS: Lung bases are unremarkable. Sagittal images of the spine are unremarkable. Liver, spleen, pancreas and adrenal glands are unremarkable. Kidneys are symmetrical in size and enhancement. No hydronephrosis or hydroureter. No calcified gallstones are noted within gallbladder. No aortic aneurysm. No small bowel obstruction. No ascites or free air. No adenopathy. There is no pericecal inflammation. The terminal ileum is unremarkable. There is a low lying cecum. Normal appendix is partially visualized in coronal image 39. There is abnormal thickening of colonic wall in splenic flexure of the colon, descending colon and sigmoid colon. Mild enhancement of mucosa in sigmoid colon. Some liquid stool noted within sigmoid colon. Findings are consistent with segmental colitis. Clinical correlation is necessary. There is no evidence of pericolonic abscess or perforation. No significant adenopathy. No free abdominal air. IMPRESSION: 1. There is thickening of colonic wall up to 6 mm in splenic flexure of the colon, descending colon and sigmoid colon. Findings are consistent with long segment colitis. No pericolonic abscess or perforation. 2. There is a low lying cecum. Normal appendix. No pericecal inflammation. 3. No small bowel obstruction.  No free abdominal air. 4. No hydronephrosis or hydroureter. Electronically Signed   By: LiLahoma Crocker.D.   On: 12/07/2014 18:20    Microbiology: Recent Results (from the past 240 hour(s))  Urine culture     Status: None   Collection Time: 12/05/14  6:15 PM  Result Value Ref Range Status   Specimen Description URINE,  CATHETERIZED  Final   Special Requests NONE  Final   Culture   Final    NO GROWTH 2 DAYS [facility]  Report Status 12/07/2014 FINAL  Final  C difficile quick scan w PCR reflex     Status: None   Collection Time: 12/08/14 12:24 AM  Result Value Ref Range Status   C Diff antigen NEGATIVE NEGATIVE Final   C Diff toxin NEGATIVE NEGATIVE Final   C Diff interpretation Negative for toxigenic C. difficile  Final  Ova and parasite examination     Status: None   Collection Time: 12/08/14 12:24 AM  Result Value Ref Range Status   Specimen Description STOOL  Final   Special Requests NONE  Final   Ova and parasites   Final    NO OVA OR PARASITES SEEN ABUNDANT WBC Performed at [facility]  Report Status 12/09/2014 FINAL  Final  Stool culture     Status: None   Collection Time: 12/08/14 12:24 AM  Result Value Ref Range Status  Specimen Description STOOL  Final   Special Requests NONE  Final   Culture   Final    NO SALMONELLA, SHIGELLA, CAMPYLOBACTER, YERSINIA, OR E.COLI 0157:[id number] ISOLATED Note: REDUCED NORMAL FLORA PRESENT Performed at [facility]    Report Status 12/12/2014 FINAL  Final  Culture, blood (routine x 2)     Status: None (Preliminary result)   Collection Time: 12/09/14  7:20 AM  Result Value Ref Range Status   Specimen Description BLOOD LEFT ARM  Final   Special Requests BOTTLES DRAWN AEROBIC ONLY 3CC  Final   Culture   Final    NO GROWTH 3 DAYS Performed at [facility]    Report Status PENDING  Incomplete  Culture, blood (routine x 2)     Status: None (Preliminary result)   Collection Time: 12/09/14  7:25 AM  Result Value Ref Range Status   Specimen Description BLOOD LEFT HAND  Final   Special Requests BOTTLES DRAWN AEROBIC AND ANAEROBIC 5CC  Final   Culture   Final    NO GROWTH 3 DAYS Performed at [facility]    Report Status PENDING  Incomplete     Labs: Basic Metabolic Panel:  Recent Labs Lab  12/09/14 0540 12/09/14 1956 12/10/14 0544 12/11/14 0553 12/12/14 0524 12/13/14 0600  NA 133* 136 136 140 141 141  K 2.8* 3.2* 3.1* 4.1 4.3 3.8  CL 103 106 106 109 108 106  CO2 21* 24 26 24 27 29   GLUCOSE 107* 128* 143* 189* 125* 86  BUN <5* <5* <5* <5* 9 9  CREATININE 0.45 0.39* 0.41* <0.30* 0.36* 0.46  CALCIUM 7.9* 8.0* 7.9* 8.2* 8.6* 8.1*  MG 1.7  --   --   --   --   --    Liver Function Tests:  Recent Labs Lab 12/07/14 1650 12/08/14 0610  AST 20 21  ALT 43 37  ALKPHOS 73 95  BILITOT 0.9 0.9  PROT 8.1 6.7  ALBUMIN 3.4* 2.9*    Recent Labs Lab 12/07/14 1650  LIPASE 29   No results for input(s): AMMONIA in the last 168 hours. CBC:  Recent Labs Lab 12/07/14 1650 12/08/14 0610 12/09/14 0540 12/10/14 0544 12/12/14 0524  WBC 9.5 8.4 7.1 7.2 11.8*  NEUTROABS 6.7  --   --   --   --   HGB 13.4 12.6 12.2 11.2* 10.7*  HCT 39.0 36.7 34.7* 32.3* 31.2*  MCV 94.2 93.6 91.8 91.5 94.8  PLT 332 298 287 271 316   Cardiac Enzymes: No results for input(s): CKTOTAL, CKMB, CKMBINDEX, TROPONINI in the last 168 hours. BNP: BNP (last 3 results) No results for input(s): BNP in the last 8760 hours.  ProBNP (last 3 results) No results for input(s): PROBNP in the last 8760 hours.  CBG:  Recent Labs Lab 12/08/14 0755 12/10/14 0757 12/11/14 0754 12/12/14 0739 12/13/14 0746  GLUCAP 88 141* 178* 115* 73       Signed:  Amillya Chavira A  [facility] 12/13/2014, 12:20 PM

## 2014-12-14 ENCOUNTER — Encounter: Payer: Self-pay | Admitting: Gastroenterology

## 2014-12-14 LAB — CULTURE, BLOOD (ROUTINE X 2)
CULTURE: NO GROWTH
Culture: NO GROWTH

## 2014-12-14 LAB — QUANTIFERON IN TUBE
QFT TB AG MINUS NIL VALUE: 0.03 IU/mL
QUANTIFERON MITOGEN VALUE: 0.62 IU/mL
QUANTIFERON TB AG VALUE: 0.1 [IU]/mL
QUANTIFERON TB GOLD: NEGATIVE
Quantiferon Nil Value: 0.07 IU/mL

## 2014-12-14 LAB — QUANTIFERON TB GOLD ASSAY (BLOOD)

## 2014-12-21 ENCOUNTER — Encounter: Payer: Self-pay | Admitting: Physician Assistant

## 2014-12-21 ENCOUNTER — Ambulatory Visit (INDEPENDENT_AMBULATORY_CARE_PROVIDER_SITE_OTHER): Payer: Self-pay | Admitting: Physician Assistant

## 2014-12-21 VITALS — BP 100/70 | HR 88 | Ht 67.0 in | Wt 149.2 lb

## 2014-12-21 DIAGNOSIS — K5150 Left sided colitis without complications: Secondary | ICD-10-CM

## 2014-12-21 NOTE — Progress Notes (Signed)
Patient ID: Nicole Reeves, female   DOB: 04-14-1985, 29 y.o.   MRN: [id number]   Subjective:    Patient ID: Nicole Reeves, female    DOB: [birth year], 29 y.o.   MRN: [id number]  HPI Nicole Reeves is a pleasant 29 year old white female who was seen recently in consultation while she was hospitalized with an acute colitis. She had developed an abrupt onset of abdominal cramping and bloody diarrhea for about 1 week prior to admission. She was placed empirically on Cipro and Flagyl, infectious workup was negative. She underwent flexible sigmoidoscopy with Dr. Lavon Paganini on 12/10/2014 and this showed a severe sigmoid colitis with relative sparing of the rectum it was felt this was consistent with IBD versus CMV. Biopsies were taken and showed acute colitis no evidence of CMV there are  focal changes present suggestive of chronicity. She was started on a course of prednisone 40 mg by mouth daily and Bentyl as needed for cramping. Cipro and Flagyl were discontinued at the time of discharge. She comes in today for follow-up stating that she is feeling much better. She is having no abdominal pain or cramping her diarrhea has resolved and she has not been seeing any blood. She is eating without difficulty. She says she's been a bit moody on prednisone but otherwise has been tolerating. Most recent labs 12/12/2014 hemoglobin of 10.7 hematocrit of 31.2. QuantiFERON Gold was done and negative hepatitis serologies negative, IBD first step was not done inpatient. Patient had already been scheduled for colonoscopy with Dr. Lavon Paganini on 02/02/2015.  Review of Systems Pertinent positive and negative review of systems were noted in the above HPI section.  All other review of systems was otherwise negative.  Outpatient Encounter Prescriptions as of 12/21/2014  Medication Sig  . dicyclomine (BENTYL) 10 MG capsule Take 1 capsule (10 mg total) by mouth every 6 (six) hours as needed for spasms (cramping).  . Multiple  Vitamin (MULTIVITAMIN WITH MINERALS) TABS tablet Take 1 tablet by mouth daily.  . predniSONE (DELTASONE) 20 MG tablet Take 40 mg daily for 2 weeks then 30 mg daily for 2 weeks, then follow up with Gastroenterologist for further instruction.   No facility-administered encounter medications on file as of 12/21/2014.   No Known Allergies Patient Active Problem List   Diagnosis Date Noted  . Diarrhea   . Blood in stool   . Hyponatremia 12/07/2014  . Acute colitis 12/07/2014   Social History   Social History  . Marital Status: Single    Spouse Name: N/A  . Number of Children: N/A  . Years of Education: N/A   Occupational History  . [address] [employer]    Social History Main Topics  . Smoking status: Current Every Day Smoker -- 1.00 packs/day    Types: Cigarettes  . Smokeless tobacco: Never Used  . Alcohol Use: 0.0 oz/week    0 Standard drinks or equivalent per week  . Drug Use: Yes    Special: Marijuana  . Sexual Activity: Not on file   Other Topics Concern  . Not on file   Social History Narrative    Nicole Reeves's family history is not on file.      Objective:    Filed Vitals:   12/21/14 1431  BP: 100/70  Pulse: 88    Physical Exam  well-developed young white female in no acute distress, pleasant blood pressure 110/70 pulse 88 height 5 foot 7 weight 149. HEENT ;nontraumatic normocephalic EOMI PERRLA sclera anicteric, Cardiovascular ;regular rate and rhythm with  S1-S2 no murmur rub or gallop, Pulmonary; clear bilaterally, Abdomen; soft basically nontender there is no palpable mass or hepatosplenomegaly bowel sounds are present, Rectal; exam not done, Extremities; no clubbing cyanosis or edema skin warm and dry, Neuropsych; mood and affect appropriate       Assessment & Plan:   #1 29 yo female with recent hospitalization with an acute colitis-infectious workup negative, and biopsies of left colon show changes suggestive of chronicity- suspect IBD Improving on  prednisone #2 anemia secondary to above  Plan; Continue Prednisone taper- decrease to 30 mg po daily now, then decrease by 5 mg per week until off  Start Lialda 1.2 gm, 2 po BID- samples given today enough for 4 weeks. ( pt currently has no insurance- we discussed and she plans  to enroll next month) She is scheduled for Colonoscopy with Dr. Lavon PaganiniNandigam  On 02/02/2015 at 4 pm. She knows to call in the interim for more samples and if she develops any recurrence in symptoms     Nicole Reeves S Darcy Cordner PA-C 12/21/2014   Cc: No ref. provider found

## 2014-12-21 NOTE — Patient Instructions (Addendum)
We have given you samples of Lialda 1.2 g, take 2 tablets by mouth twice daily.    Prednisone, decrease to 30 mg ( 1 1/2 tab ) by mouth every motning x 7 days. Then 25 mg ( 1 1/4 tablet )  by mouth daily x 7 days. Then 20 mg ( 1 tab)  by mouth daily x 7 days Then 15 mg  ( 3/4 tab) by mouth daily x 7 days.  You have been scheduled for a colonoscopy. Please follow written instructions given to you at your visit today.  We have given you a sample of Suprep for the colonoscopy. If you use inhalers (even only as needed), please bring them with you on the day of your procedure. Your physician has requested that you go to www.startemmi.com and enter the access code given to you at your visit today. This web site gives a general overview about your procedure. However, you should still follow specific instructions given to you by our office regarding your preparation for the procedure.

## 2014-12-24 NOTE — Progress Notes (Signed)
Reviewed and agree with documentation and assessment and plan. K. Veena Nandigam , MD   

## 2015-01-10 ENCOUNTER — Other Ambulatory Visit: Payer: Self-pay

## 2015-01-10 ENCOUNTER — Telehealth: Payer: Self-pay | Admitting: Gastroenterology

## 2015-01-10 MED ORDER — MESALAMINE 1.2 G PO TBEC: 1.2000 g | DELAYED_RELEASE_TABLET | Freq: Every day | ORAL | Status: DC

## 2015-01-10 NOTE — Telephone Encounter (Signed)
Samples at the front desk for the patient to pick up. 

## 2015-01-25 ENCOUNTER — Telehealth: Payer: Self-pay | Admitting: Physician Assistant

## 2015-01-25 NOTE — Telephone Encounter (Signed)
Headaches started with the starting of Lialda. These have become progressively worse. She is off Prednisone. No bowel issues or abdominal pain. Just a headache that will wake her up in the middle of the night and continues all day. Please advise.

## 2015-01-25 NOTE — Telephone Encounter (Signed)
Stop Lialda - acn cause HA.. Will see what up coming colonoscopy shows

## 2015-01-25 NOTE — Telephone Encounter (Signed)
Patient is advised.  

## 2015-02-02 ENCOUNTER — Encounter: Payer: Self-pay | Admitting: Gastroenterology

## 2015-03-14 ENCOUNTER — Ambulatory Visit (AMBULATORY_SURGERY_CENTER): Payer: BLUE CROSS/BLUE SHIELD | Admitting: Gastroenterology

## 2015-03-14 ENCOUNTER — Encounter: Payer: Self-pay | Admitting: Gastroenterology

## 2015-03-14 VITALS — BP 95/69 | HR 64 | Temp 96.5°F | Resp 18 | Ht 67.0 in | Wt 149.0 lb

## 2015-03-14 DIAGNOSIS — K5150 Left sided colitis without complications: Secondary | ICD-10-CM

## 2015-03-14 DIAGNOSIS — Z1211 Encounter for screening for malignant neoplasm of colon: Secondary | ICD-10-CM

## 2015-03-14 MED ORDER — SODIUM CHLORIDE 0.9 % IV SOLN: 500.0000 mL | INTRAVENOUS | Status: DC

## 2015-03-14 MED ORDER — MESALAMINE 400 MG PO CPDR: 800.0000 mg | DELAYED_RELEASE_CAPSULE | Freq: Three times a day (TID) | ORAL | Status: DC

## 2015-03-14 NOTE — Patient Instructions (Addendum)
Biopsies taken today. Follow up in office with Amy,PA in 1-2 weeks. Call office to make this appointment. Start Asacol as directed.   YOU HAD AN ENDOSCOPIC PROCEDURE TODAY AT [facility]:   Refer to the procedure report that was given to you for any specific questions about what was found during the examination.  If the procedure report does not answer your questions, please call your gastroenterologist to clarify.  If you requested that your care partner not be given the details of your procedure findings, then the procedure report has been included in a sealed envelope for you to review at your convenience later.  YOU SHOULD EXPECT: Some feelings of bloating in the abdomen. Passage of more gas than usual.  Walking can help get rid of the air that was put into your GI tract during the procedure and reduce the bloating. If you had a lower endoscopy (such as a colonoscopy or flexible sigmoidoscopy) you may notice spotting of blood in your stool or on the toilet paper. If you underwent a bowel prep for your procedure, you may not have a normal bowel movement for a few days.  Please Note:  You might notice some irritation and congestion in your nose or some drainage.  This is from the oxygen used during your procedure.  There is no need for concern and it should clear up in a day or so.  SYMPTOMS TO REPORT IMMEDIATELY:   Following lower endoscopy (colonoscopy or flexible sigmoidoscopy):  Excessive amounts of blood in the stool  Significant tenderness or worsening of abdominal pains  Swelling of the abdomen that is new, acute  Fever of 100F or higher   For urgent or emergent issues, a gastroenterologist can be reached at any hour by calling [phone].   DIET: Your first meal following the procedure should be a small meal and then it is ok to progress to your normal diet. Heavy or fried foods are harder to digest and may make you feel nauseous or bloated.  Likewise, meals heavy  in dairy and vegetables can increase bloating.  Drink plenty of fluids but you should avoid alcoholic beverages for 24 hours.  ACTIVITY:  You should plan to take it easy for the rest of today and you should NOT DRIVE or use heavy machinery until tomorrow (because of the sedation medicines used during the test).    FOLLOW UP: Our staff will call the number listed on your records the next business day following your procedure to check on you and address any questions or concerns that you may have regarding the information given to you following your procedure. If we do not reach you, we will leave a message.  However, if you are feeling well and you are not experiencing any problems, there is no need to return our call.  We will assume that you have returned to your regular daily activities without incident.  If any biopsies were taken you will be contacted by phone or by letter within the next 1-3 weeks.  Please call us at [phone] if you have not heard about the biopsies in 3 weeks.    SIGNATURES/CONFIDENTIALITY: You and/or your care partner have signed paperwork which will be entered into your electronic medical record.  These signatures attest to the fact that that the information above on your After Visit Summary has been reviewed and is understood.  Full responsibility of the confidentiality of this discharge information lies with you and/or your care-partner.

## 2015-03-14 NOTE — Progress Notes (Signed)
Called to room to assist during endoscopic procedure.  Patient ID and intended procedure confirmed with present staff. Received instructions for my participation in the procedure from the performing physician.  

## 2015-03-14 NOTE — Op Note (Signed)
[facility] [address] [place], [id number]   COLONOSCOPY PROCEDURE REPORT  PATIENT: Nicole Reeves, Nicole Reeves  MR#: [id number] BIRTHDATE: Apr 09, 1985 , 29  yrs. old GENDER: female ENDOSCOPIST: Marsa ArisKavitha Shagun Wordell, MD REFERRED BY: PROCEDURE DATE:  03/14/2015 PROCEDURE:   Colonoscopy, diagnostic and Colonoscopy with biopsy First Screening Colonoscopy - Avg.  risk and is 50 yrs.  old or older Yes.  Prior Negative Screening - Now for repeat screening. N/A  History of Adenoma - Now for follow-up colonoscopy & has been > or = to 3 yrs.  N/A  Polyps removed today? No Recommend repeat exam, <10 yrs? Yes other Recommend repeat exam, <10 yrs? Yes ASA CLASS:   Class II INDICATIONS:Inflammatory bowel disease of the intestine if more precise diagnosis or determination of the extent / severity of activity of disease will influence immediate / future management and Colorectal Neoplasm Risk Assessment for this procedure is average risk. MEDICATIONS: Propofol 400 mg IV  DESCRIPTION OF PROCEDURE:   After the risks benefits and alternatives of the procedure were thoroughly explained, informed consent was obtained.  The digital rectal exam revealed no abnormalities of the rectum.   The LB [id number]-[id number]  endoscope was introduced through the anus and advanced to the terminal ileum which was intubated for a short distance. No adverse events experienced.   The quality of the prep was good.  The instrument was then slowly withdrawn as the colon was fully examined. Estimated blood loss is zero unless otherwise noted in this procedure report.   COLON FINDINGS: Ascending terminal ileum appeared normal random biopsies obtained.  Mucosa in cecum, ascending colon and transverse colon appeared normal, segmental biopsies were obtained.  aphthous ulceration of mucosa with decreased vascularity was noted starting in the descending colon at 45 cm from anal verge and extending up to 20 cm in sigmoid  colon, biopsies were obtained.  Rectum appeared to be relatedly spared, biopsies were obtained.  Retroflexed views revealed no abnormalities. The time to cecum = 3.6 Withdrawal time = 12.7   The scope was withdrawn and the procedure completed. COMPLICATIONS: There were no immediate complications.  ENDOSCOPIC IMPRESSION: active colitis with aphthous ulcers in sigmoid and descending colon  RECOMMENDATIONS: 1.  Await biopsy results 2.  Follow-up in office visit in 1-2 weeks 3.  Start Asacol and based on biopsy results will have to consider starting 6MP or Biologics  eSigned:  Marsa ArisKavitha Billie Trager, MD 03/14/2015 8:41 AM

## 2015-03-14 NOTE — Progress Notes (Signed)
To recovery, report to RN, VSS. 

## 2015-03-15 ENCOUNTER — Telehealth: Payer: Self-pay

## 2015-03-15 NOTE — Telephone Encounter (Signed)
  Follow up Call- Unable to leave message wrong number or disconnected. Call back number 03/14/2015  Post procedure Call Back phone  # [phone]  Permission to leave phone message Yes

## 2015-03-15 NOTE — Telephone Encounter (Signed)
I have left message for the patient to call back. She is scheduled for a follow up appointment per Dr Lavon Paganini 03/22/15 at 2:30 with her extender.

## 2015-03-17 NOTE — Telephone Encounter (Signed)
Patient confirmed the appointment

## 2015-03-22 ENCOUNTER — Other Ambulatory Visit (INDEPENDENT_AMBULATORY_CARE_PROVIDER_SITE_OTHER): Payer: BLUE CROSS/BLUE SHIELD

## 2015-03-22 ENCOUNTER — Ambulatory Visit (INDEPENDENT_AMBULATORY_CARE_PROVIDER_SITE_OTHER): Payer: BLUE CROSS/BLUE SHIELD | Admitting: Physician Assistant

## 2015-03-22 ENCOUNTER — Encounter: Payer: Self-pay | Admitting: Physician Assistant

## 2015-03-22 VITALS — BP 96/68 | HR 66 | Ht 67.0 in | Wt 154.0 lb

## 2015-03-22 DIAGNOSIS — K51519 Left sided colitis with unspecified complications: Secondary | ICD-10-CM

## 2015-03-22 LAB — CBC WITH DIFFERENTIAL/PLATELET
BASOS PCT: 0.4 % (ref 0.0–3.0)
Basophils Absolute: 0 10*3/uL (ref 0.0–0.1)
EOS PCT: 2 % (ref 0.0–5.0)
Eosinophils Absolute: 0.1 10*3/uL (ref 0.0–0.7)
HCT: 41.4 % (ref 36.0–46.0)
HEMOGLOBIN: 14.1 g/dL (ref 12.0–15.0)
LYMPHS ABS: 2.3 10*3/uL (ref 0.7–4.0)
Lymphocytes Relative: 31.3 % (ref 12.0–46.0)
MCHC: 34 g/dL (ref 30.0–36.0)
MCV: 93.1 fl (ref 78.0–100.0)
MONO ABS: 0.5 10*3/uL (ref 0.1–1.0)
Monocytes Relative: 7.1 % (ref 3.0–12.0)
Neutro Abs: 4.3 10*3/uL (ref 1.4–7.7)
Neutrophils Relative %: 59.2 % (ref 43.0–77.0)
Platelets: 263 10*3/uL (ref 150.0–400.0)
RBC: 4.45 Mil/uL (ref 3.87–5.11)
RDW: 13 % (ref 11.5–15.5)
WBC: 7.3 10*3/uL (ref 4.0–10.5)

## 2015-03-22 LAB — HIGH SENSITIVITY CRP: CRP HIGH SENSITIVITY: 1.28 mg/L (ref 0.000–5.000)

## 2015-03-22 LAB — THYROID PANEL WITH TSH
Free Thyroxine Index: 2.4 (ref 1.4–3.8)
T3 Uptake: 25 % (ref 22–35)
T4, Total: 9.4 ug/dL (ref 4.5–12.0)
TSH: 1.264 u[IU]/mL (ref 0.350–4.500)

## 2015-03-22 MED ORDER — MESALAMINE 400 MG PO CPDR: 800.0000 mg | DELAYED_RELEASE_CAPSULE | Freq: Three times a day (TID) | ORAL | Status: DC

## 2015-03-22 NOTE — Progress Notes (Signed)
Reviewed and agree with documentation and assessment and plan. K. Veena Nandigam , MD   

## 2015-03-22 NOTE — Patient Instructions (Addendum)
Please go to the basement level to have your labs drawn.  Continue the Asacol 400 mg, take 2 tablets v=by mouth, 3 times daily . Take a multivitamin daily.   We sent refills for the Asacol.  Call us for any problems. Do not stop the medication.  Consider contacting the [facility] to learn about Ulcerative colitis.  We will put you on a wait list for an appointment with Dr. Lavon Paganini.  We will call you with an appointment.

## 2015-03-22 NOTE — Progress Notes (Signed)
Patient ID: Nicole Reeves, female   DOB: 1985-06-16, 30 y.o.   MRN: [id number]   Subjective:    Patient ID: Nicole Reeves, female    DOB: [birth year], 30 y.o.   MRN: [id number]  HPI  Nicole Reeves   Is a 30 year old white female who comes in today post-colonoscopy to discuss management. Patient was originally seen in the office in October 2000 16 after she had been hospitalized with an acute colitis. She had undergone colonoscopy at that time and biopsies were suggestive of IBD in the left colon and she was treated with a course of Lialda and a short round of steroids. She wound up stopping the Lialda because of headaches. She underwent colonoscopy on 03/14/2015 per Dr. Valda Lamb to determine if she actually has a chronic colitis. The terminal ileum appeared normal had a few aphthous ulcers in the left colon and the sigmoid area and the rectum appeared spared. Multiple biopsies were taken. There was no evidence of inflammation in the terminal ileum right colon biopsies were negative transverse colon biopsies negative and the biopsies from the sigmoid colon showed findings consistent with chronic active colitis. Rectal biopsies negative  She has just started on a's a call 800 mg 3 times a day. She says she feels fine and currently has absolutely no symptoms. She denies any abdominal pain diarrhea cramping or rectal bleeding. She is thus far tolerating the is occult without side effects.  She does mention that she feels that she's had some hair loss over the past couple of months and wasn't sure whether that was related or not.  Reviewing her labs she did have an elevated TSH in October. Prior hepatitis serologies were negative as was QuantiFERON Gold.  Review of Systems Pertinent positive and negative review of systems were noted in the above HPI section.  All other review of systems was otherwise negative.  Outpatient Encounter Prescriptions as of 03/22/2015  Medication Sig  . dicyclomine  (BENTYL) 10 MG capsule Take 1 capsule (10 mg total) by mouth every 6 (six) hours as needed for spasms (cramping).  . Mesalamine (ASACOL) 400 MG CPDR DR capsule Take 2 capsules (800 mg total) by mouth 3 (three) times daily.  . Multiple Vitamin (MULTIVITAMIN WITH MINERALS) TABS tablet Take 1 tablet by mouth daily.  . [DISCONTINUED] Mesalamine (ASACOL) 400 MG CPDR DR capsule Take 2 capsules (800 mg total) by mouth 3 (three) times daily.   No facility-administered encounter medications on file as of 03/22/2015.   No Known Allergies Patient Active Problem List   Diagnosis Date Noted  . Diarrhea   . Blood in stool   . Hyponatremia 12/07/2014  . Acute colitis 12/07/2014   Social History   Social History  . Marital Status: Single    Spouse Name: N/A  . Number of Children: N/A  . Years of Education: N/A   Occupational History  . [employer]    Social History Main Topics  . Smoking status: Current Every Day Smoker -- 1.00 packs/day    Types: Cigarettes  . Smokeless tobacco: Never Used  . Alcohol Use: No  . Drug Use: No     Comment: not used for a year  . Sexual Activity: Not Currently   Other Topics Concern  . Not on file   Social History Narrative    Nicole Reeves's family history is negative for Colon cancer, Esophageal cancer, Rectal cancer, and Stomach cancer.      Objective:    Filed Vitals:  03/22/15 1421  BP: 96/68  Pulse: 66    Physical Exam    Well-developed young white female in no acute distress, pleasant blood pressure 96/68 pulse 66 height 5 foot 7 weight 154.  Not further examined today discussion only     Assessment & Plan:   #1  30 yo female with new dx of left sided colitis- currently confined to the sigmoid colon - asymptomatic on Asacol 2.4 gm daily #2  thinning hair- r/o secondary to hypothyroid  Plan; Continue Asacol 400 mg ,2 po tid chronically Check Cbc,Crp, and  Thyroid panel today  Follow up with Dr Silverio Decamp in 3 months  Pt  encouraged to join the [facility] to educate herself She is advised to call for any problems , and not to stop meds without calling for advice   Nicole Ferguson PA-C 03/22/2015   Cc: No ref. provider found

## 2015-03-25 ENCOUNTER — Other Ambulatory Visit: Payer: Self-pay

## 2015-03-29 ENCOUNTER — Encounter: Payer: Self-pay | Admitting: Gastroenterology

## 2015-10-05 ENCOUNTER — Telehealth: Payer: Self-pay | Admitting: Gastroenterology

## 2015-10-05 DIAGNOSIS — K51511 Left sided colitis with rectal bleeding: Secondary | ICD-10-CM

## 2015-10-05 MED ORDER — BUDESONIDE 9 MG PO TB24: ORAL_TABLET | ORAL | 0 refills | Status: DC

## 2015-10-05 MED ORDER — MESALAMINE 4 G RE ENEM: 4.0000 g | ENEMA | Freq: Every day | RECTAL | 1 refills | Status: DC

## 2015-10-05 NOTE — Telephone Encounter (Signed)
Patient called this evening, reported a flare of UC symptoms. Diagnosed with left sided UC this past October. She is on Asacol 2.4gm daily, which had been working for her. In recent days she is having high frequency stools and cramping. No fevers.   I asked her to go to the lab and submit stool for C diff to ensure negative, but will otherwise treat for UC flare. I discussed options with her and she wishes to avoid prednisone if possible. Will add Rowasa enemas to use QHS for 2 weeks, and also try a course of Uceris, 27m daily for 6 weeks. If she is no better or worsens despite this regimen, she should call [facility] and would recommend prednisone / steroids. She agreed. All questions answered

## 2015-10-06 ENCOUNTER — Encounter: Payer: Self-pay | Admitting: *Deleted

## 2015-10-06 NOTE — Telephone Encounter (Signed)
I have left message for the patient to call back. An appointment has been held for her. Waiting for her to confirm.

## 2015-10-06 NOTE — Telephone Encounter (Signed)
Patient called back last night. Insurance denied Uceris. She had wanted to avoid prednisone but think she warrants it, having > 10 BMs per day. Ordered prednisone 40mg  daily to take for 2 weeks and then recommend taper by 5mg  / week thereafter. If her Lisbeth RenshawUceris gets approved can consider switch to this. She will otherwise start Rowasa, continue Asacol, and come in for stool testing to rule out C Diff. Counseled her if she gets worse or prednisone doesn't work she needs to contact us.   This patient is one of Dr. Elana AlmNandigam's, Amy also knows her, and needs a follow up within 2 weeks if possible. If she can't get in within 2 weeks, we will need to call her and order rest of prednisone taper, I only provided her 40mg  / day for 2 weeks up to this point. Also, I am going to be out of town for the next week, and the patient's C Diff will come back during that time, if someone could keep an eye out for it. Thanks

## 2015-10-07 NOTE — Telephone Encounter (Signed)
Spoke with the patient. She will not be able to keep the appointment for follow up as scheduled. She wants the appointment cancelled. She will call back and reschedule.

## 2015-10-10 ENCOUNTER — Telehealth: Payer: Self-pay | Admitting: Physician Assistant

## 2015-10-10 NOTE — Telephone Encounter (Signed)
C-diff test has not been collected as of yet.

## 2015-10-11 ENCOUNTER — Other Ambulatory Visit: Payer: BLUE CROSS/BLUE SHIELD

## 2015-10-11 DIAGNOSIS — K921 Melena: Secondary | ICD-10-CM

## 2015-10-11 NOTE — Telephone Encounter (Signed)
Appointment scheduled. She will go to the lab today. See telephone note from 10/10/15

## 2015-10-11 NOTE — Telephone Encounter (Signed)
Continues to have watery stools. She will come to the lab to either submit a specimen or get the container to collect at home.

## 2015-10-12 ENCOUNTER — Telehealth: Payer: Self-pay | Admitting: *Deleted

## 2015-10-12 NOTE — Telephone Encounter (Signed)
Patient's insurance ([employer]) has denied Uceris. Patient must have tried and failed prednisone (which she has) AND budesonide capsules prior to approval. I have sent an appeal form back [facility] and am awaiting response.

## 2015-10-13 LAB — CLOSTRIDIUM DIFFICILE BY PCR: Toxigenic C. Difficile by PCR: NOT DETECTED

## 2015-10-17 ENCOUNTER — Other Ambulatory Visit: Payer: Self-pay

## 2015-10-17 ENCOUNTER — Telehealth: Payer: Self-pay | Admitting: *Deleted

## 2015-10-17 MED ORDER — PREDNISONE 10 MG PO TABS: ORAL_TABLET | ORAL | 0 refills | Status: DC

## 2015-10-17 NOTE — Telephone Encounter (Signed)
Pharmacist, Adela LankJacqueline from [facility] called asking for directions on the Prednisone script sent in today.  I was advised by Darcey NoraSheri Jones RN, to decrease to 35 mg daily until patient sees Dr. Lavon PaganiniNandigam on Friday 10-21-2015.  Called in # 30, 0 refills until Dr. Lavon PaganiniNandigam changes directions and prescription.

## 2015-10-18 ENCOUNTER — Ambulatory Visit: Payer: BLUE CROSS/BLUE SHIELD | Admitting: Gastroenterology

## 2015-10-18 NOTE — Telephone Encounter (Signed)
Patient's insurance has denied Uceris and has also denied appeals for Uceris. She must have tried and failed Prednisone AND budesonide. See 10/05/15 telephone note for alternate instructions after denial of Uceris.

## 2015-10-19 ENCOUNTER — Telehealth: Payer: Self-pay | Admitting: Gastroenterology

## 2015-10-19 NOTE — Telephone Encounter (Signed)
Uceris prior authorization and appeal letter for Nicole RenshawUceris has both already been denied. See Dr Lanetta InchArmbruster's recommendations from 10/05/15 for alternate medication. No further action should be necessary.

## 2015-10-21 ENCOUNTER — Other Ambulatory Visit (INDEPENDENT_AMBULATORY_CARE_PROVIDER_SITE_OTHER): Payer: BLUE CROSS/BLUE SHIELD

## 2015-10-21 ENCOUNTER — Ambulatory Visit (INDEPENDENT_AMBULATORY_CARE_PROVIDER_SITE_OTHER): Payer: BLUE CROSS/BLUE SHIELD | Admitting: Gastroenterology

## 2015-10-21 ENCOUNTER — Encounter (INDEPENDENT_AMBULATORY_CARE_PROVIDER_SITE_OTHER): Payer: Self-pay

## 2015-10-21 ENCOUNTER — Telehealth: Payer: Self-pay | Admitting: Gastroenterology

## 2015-10-21 ENCOUNTER — Encounter: Payer: Self-pay | Admitting: Gastroenterology

## 2015-10-21 DIAGNOSIS — K5180 Other ulcerative colitis without complications: Secondary | ICD-10-CM

## 2015-10-21 LAB — CBC WITH DIFFERENTIAL/PLATELET
Basophils Absolute: 0 10*3/uL (ref 0.0–0.1)
Basophils Relative: 0.2 % (ref 0.0–3.0)
EOS PCT: 0.5 % (ref 0.0–5.0)
Eosinophils Absolute: 0.1 10*3/uL (ref 0.0–0.7)
HCT: 39.8 % (ref 36.0–46.0)
Hemoglobin: 13.7 g/dL (ref 12.0–15.0)
LYMPHS ABS: 1.3 10*3/uL (ref 0.7–4.0)
Lymphocytes Relative: 8.6 % — ABNORMAL LOW (ref 12.0–46.0)
MCHC: 34.3 g/dL (ref 30.0–36.0)
MCV: 94.1 fl (ref 78.0–100.0)
MONO ABS: 0.6 10*3/uL (ref 0.1–1.0)
Monocytes Relative: 3.8 % (ref 3.0–12.0)
NEUTROS ABS: 13.5 10*3/uL — AB (ref 1.4–7.7)
PLATELETS: 297 10*3/uL (ref 150.0–400.0)
RBC: 4.23 Mil/uL (ref 3.87–5.11)
RDW: 13.4 % (ref 11.5–15.5)
WBC: 15.5 10*3/uL — ABNORMAL HIGH (ref 4.0–10.5)

## 2015-10-21 LAB — SEDIMENTATION RATE: SED RATE: 24 mm/h — AB (ref 0–20)

## 2015-10-21 LAB — IBC PANEL
Iron: 63 ug/dL (ref 42–145)
Saturation Ratios: 18.2 % — ABNORMAL LOW (ref 20.0–50.0)
Transferrin: 247 mg/dL (ref 212.0–360.0)

## 2015-10-21 LAB — FOLATE: Folate: >23.7 ng/mL (ref >5.9)

## 2015-10-21 LAB — HIGH SENSITIVITY CRP: CRP, High Sensitivity: 3.22 mg/L (ref 0.000–5.000)

## 2015-10-21 LAB — FERRITIN: FERRITIN: 22.6 ng/mL (ref 10.0–291.0)

## 2015-10-21 LAB — VITAMIN B12: Vitamin B-12: >1500 pg/mL — ABNORMAL HIGH (ref 211–911)

## 2015-10-21 MED ORDER — BUDESONIDE 9 MG PO TB24: 1.0000 | ORAL_TABLET | Freq: Every day | ORAL | 3 refills | Status: DC

## 2015-10-21 MED ORDER — PREDNISONE 10 MG PO TABS: ORAL_TABLET | ORAL | 0 refills | Status: DC

## 2015-10-21 NOTE — Telephone Encounter (Signed)
Spoke with pt and she is aware. States she will need another script to complete taper. Script sent to pharmacy.

## 2015-10-21 NOTE — Progress Notes (Signed)
         Nicole Reeves    [id number]    04/04/1985  Primary Care Physician:No PCP Per Patient  Referring Physician: No referring provider defined for this encounter.  Chief complaint:  Ulcerative Colitis  HPI: 30-year-old white female with h/o ulcerative colitis is here for follow up visit, after a recent flare 2 weeks ago. She has increased bowel frequency/ diarrhea with mucus but no blood in stool. She was started on Prednisone taper as patient's insurance did not cover Uceris and she could not afford it. She was also advised to start Rowasa enemas but patient reported doing only once. She feels better on prednisone with improvement of symptoms, she recently started a new job is under tremendous stress and is wondering if the stress brought on her symptoms.  She was last seen in office visit in January 2017, was asymptomatic at that time .She was admitted in October 2016 with diarrhea, blood in stool and abdominal pain, had flexible sigmoidoscopy at the time and biopsies were suggestive of IBD in the left colon and she was treated with a course of Lialda and a short round of steroids. She wound up stopping the Lialda because of headaches. She underwent colonoscopy on 03/14/2015 .The terminal ileum appeared normal had a few aphthous ulcers in the left colon and the sigmoid area and the rectum appeared spared. Multiple biopsies were taken. There was no evidence of inflammation in the terminal ileum right colon biopsies were negative transverse colon biopsies negative and the biopsies from the sigmoid colon showed findings consistent with chronic active colitis. Rectal biopsies negative  She has just started on Asacol 800 mg 3 times a day, post colonoscopy as patient denied any symptoms. she was subsequently seen in office visit by Amy Esther Wood on 03/22/2015 . At that time she said she feels fine and had  absolutely no symptoms, labs were wnl, CRP was also normal.  Patient was advised to  follow-up in 3 months , but she hasn't scheduled it . Unclear if she was taking Asacol in the interim.   currently she is having 3-4 semi-formed to liquid bowel movements daily , no blood in stool and intermittent abdominal cramps , is on 35 mg of prednisone .    Outpatient Encounter Prescriptions as of 10/21/2015  Medication Sig  . Mesalamine (ASACOL) 400 MG CPDR DR capsule Take 2 capsules (800 mg total) by mouth 3 (three) times daily.  . mesalamine (ROWASA) 4 g enema Place 60 mLs (4 g total) rectally at bedtime.  . Multiple Vitamin (MULTIVITAMIN WITH MINERALS) TABS tablet Take 1 tablet by mouth daily.  . predniSONE (DELTASONE) 10 MG tablet Take as directed (Patient taking differently: 35 mg. Take as directed)  . [DISCONTINUED] Budesonide (UCERIS) 9 MG TB24 1 tab po q day for 6 weeks.  . [DISCONTINUED] dicyclomine (BENTYL) 10 MG capsule Take 1 capsule (10 mg total) by mouth every 6 (six) hours as needed for spasms (cramping).   No facility-administered encounter medications on file as of 10/21/2015.     Allergies as of 10/21/2015  . (No Known Allergies)    Past Medical History:  Diagnosis Date  . Colitis 11/2014   acute onset bloody diarrhea. C diff, O&P negative.   . Left sided ulcerative colitis (HCC)   . Polysubstance abuse 02/24/2014   tox screen + for THC, Barbiturates, amphetimine.    Past Surgical History:  Procedure Laterality Date  . FLEXIBLE SIGMOIDOSCOPY N/A 12/10/2014   Procedure:   FLEXIBLE SIGMOIDOSCOPY;  Surgeon: Kavitha Nandigam V, MD;  Location: [facility];  Service: Endoscopy;  Laterality: N/A;    Family History  Problem Relation Age of Onset  . Colon cancer Neg Hx   . Esophageal cancer Neg Hx   . Rectal cancer Neg Hx   . Stomach cancer Neg Hx     Social History   Social History  . Marital status: Single    Spouse name: N/A  . Number of children: N/A  . Years of education: N/A   Occupational History  . [employer]    Social History Main  Topics  . Smoking status: Current Every Day Smoker    Packs/day: 1.00    Types: Cigarettes  . Smokeless tobacco: Never Used  . Alcohol use No  . Drug use: No     Comment: not used for a year  . Sexual activity: Not Currently   Other Topics Concern  . Not on file   Social History Narrative  . No narrative on file      Review of systems: Review of Systems  Constitutional: Negative for fever and chills.  HENT: Negative.   Eyes: Negative for blurred vision.  Respiratory: Negative for cough, shortness of breath and wheezing.   Cardiovascular: Negative for chest pain and palpitations.  Gastrointestinal: as per HPI Genitourinary: Negative for dysuria, urgency, frequency and hematuria.  Musculoskeletal: Negative for myalgias, back pain and joint pain.  Skin: Negative for itching and rash.  Neurological: Negative for dizziness, tremors, focal weakness, seizures and loss of consciousness.  Endo/Heme/Allergies: Positive for seasonal allergies.  Psychiatric/Behavioral: Negative for depression, suicidal ideas and hallucinations.  All other systems reviewed and are negative.   Physical Exam: Gen:      No acute distress HEENT:  EOMI, sclera anicteric Neck:     No masses; no thyromegaly Lungs:    Clear to auscultation bilaterally; normal respiratory effort CV:         Regular rate and rhythm; no murmurs Abd:      + bowel sounds; soft, non-tender; no palpable masses, no distension Ext:    No edema; adequate peripheral perfusion Skin:      Warm and dry; no rash Neuro: alert and oriented x 3 Psych: normal mood and affect  Data Reviewed:  Reviewed chart in epic    Assessment and Plan/Recommendations: 29-year-old female with left-sided ulcerative colitis initial presentation in October 2016 now with a flare, was started on prednisone taper with improvement of symptoms It is unclear if patient has been compliant with Asacol We will try to get patient off prednisone given potential  side effects, patient cannot afford Uceris Discussed in detail regarding chronic immunosuppressive therapy given a flare within a year from the diagnosis of ulcerative colitis Patient is willing to start chronic immunosuppression Continue Asacol and Rowasa enemas at bedtime Given compliance could be an issue, will hold off starting Biologics at this time. Will consider to start 6-MP Recheck CBC, CMP, ESR, CRP, ferritin, B12/folate Return in 1 month  25 minutes was spent face-to-face with the patient. Greater than 50% of the time used for counseling as well as treatment plan and follow-up. She had multiple questions which were answered to her satisfaction  K. Veena Nandigam , MD [phone] Mon-Fri 8a-5p [phone] after 5p, weekends, holidays  CC: No ref. provider found   

## 2015-10-21 NOTE — Telephone Encounter (Signed)
Left message for pt to call back  °

## 2015-10-21 NOTE — Telephone Encounter (Signed)
Pt states she cannot afford the uceris even with the discount card. Pt wants to know what her other options/alternatives may be. Please advise.

## 2015-10-21 NOTE — Patient Instructions (Signed)
Go to the basement for labs today Stop prednisone Rowasa enemas at bedtime Continue mesalamine Consider Humera after results of labs

## 2015-10-21 NOTE — Telephone Encounter (Signed)
Please advise her to continue Prednisone taper decrease 5mg  every week, she is currently on 35mg . Can you please check if she has enough for the taper. Planning to initiate on either Humira (compliance may be an issue) on 6MP to maintain remission and prevent further flares.

## 2015-10-24 ENCOUNTER — Other Ambulatory Visit: Payer: BLUE CROSS/BLUE SHIELD

## 2015-10-24 DIAGNOSIS — K5180 Other ulcerative colitis without complications: Secondary | ICD-10-CM

## 2015-10-25 LAB — FECAL LACTOFERRIN, QUANT: Lactoferrin: POSITIVE

## 2015-10-26 ENCOUNTER — Telehealth: Payer: Self-pay | Admitting: Gastroenterology

## 2015-10-26 ENCOUNTER — Other Ambulatory Visit: Payer: Self-pay

## 2015-10-26 MED ORDER — PREDNISONE 10 MG PO TABS: ORAL_TABLET | ORAL | 0 refills | Status: DC

## 2015-10-26 MED ORDER — MERCAPTOPURINE 50 MG PO TABS: 100.0000 mg | ORAL_TABLET | Freq: Every day | ORAL | 3 refills | Status: DC

## 2015-10-26 NOTE — Progress Notes (Signed)
Please ask patient to do labs to check TPMT enzyme activity now and follow up in office in 2-4 weeks. I would like her to be seen before she is completely tapered off steroids. Thank you

## 2015-10-26 NOTE — Telephone Encounter (Signed)
[facility]      Spoke with patient, she has  already picked up prednisone prescription that was sent yesterday. Cancelled rx from today with the pharmacist
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
## 2016-01-05 ENCOUNTER — Ambulatory Visit: Payer: BLUE CROSS/BLUE SHIELD | Admitting: Gastroenterology

## 2016-02-03 ENCOUNTER — Ambulatory Visit: Payer: BLUE CROSS/BLUE SHIELD | Admitting: Gastroenterology

## 2016-03-22 ENCOUNTER — Other Ambulatory Visit: Payer: Self-pay | Admitting: Physician Assistant

## 2016-04-26 DIAGNOSIS — B1920 Unspecified viral hepatitis C without hepatic coma: Secondary | ICD-10-CM

## 2016-04-26 HISTORY — DX: Unspecified viral hepatitis C without hepatic coma: B19.20

## 2016-06-19 ENCOUNTER — Encounter (INDEPENDENT_AMBULATORY_CARE_PROVIDER_SITE_OTHER): Payer: Self-pay

## 2016-06-19 ENCOUNTER — Ambulatory Visit (INDEPENDENT_AMBULATORY_CARE_PROVIDER_SITE_OTHER): Payer: BLUE CROSS/BLUE SHIELD | Admitting: Physician Assistant

## 2016-06-19 ENCOUNTER — Other Ambulatory Visit: Payer: Self-pay | Admitting: Obstetrics and Gynecology

## 2016-06-19 ENCOUNTER — Encounter: Payer: Self-pay | Admitting: Physician Assistant

## 2016-06-19 ENCOUNTER — Other Ambulatory Visit (INDEPENDENT_AMBULATORY_CARE_PROVIDER_SITE_OTHER): Payer: BLUE CROSS/BLUE SHIELD

## 2016-06-19 VITALS — BP 112/62 | HR 72 | Ht 67.0 in | Wt 145.6 lb

## 2016-06-19 DIAGNOSIS — K51518 Left sided colitis with other complication: Secondary | ICD-10-CM

## 2016-06-19 LAB — CBC WITH DIFFERENTIAL/PLATELET
Basophils Absolute: 0.1 10*3/uL (ref 0.0–0.1)
Basophils Relative: 0.9 % (ref 0.0–3.0)
EOS PCT: 4.6 % (ref 0.0–5.0)
Eosinophils Absolute: 0.3 10*3/uL (ref 0.0–0.7)
HEMATOCRIT: 39.6 % (ref 36.0–46.0)
HEMOGLOBIN: 13.6 g/dL (ref 12.0–15.0)
LYMPHS PCT: 22.3 % (ref 12.0–46.0)
Lymphs Abs: 1.6 10*3/uL (ref 0.7–4.0)
MCHC: 34.3 g/dL (ref 30.0–36.0)
MCV: 93.1 fl (ref 78.0–100.0)
MONOS PCT: 7.8 % (ref 3.0–12.0)
Monocytes Absolute: 0.6 10*3/uL (ref 0.1–1.0)
Neutro Abs: 4.6 10*3/uL (ref 1.4–7.7)
Neutrophils Relative %: 64.4 % (ref 43.0–77.0)
Platelets: 294 10*3/uL (ref 150.0–400.0)
RBC: 4.25 Mil/uL (ref 3.87–5.11)
RDW: 12.8 % (ref 11.5–15.5)
WBC: 7.2 10*3/uL (ref 4.0–10.5)

## 2016-06-19 LAB — SEDIMENTATION RATE: Sed Rate: 23 mm/hr — ABNORMAL HIGH (ref 0–20)

## 2016-06-19 MED ORDER — MESALAMINE 400 MG PO CPDR: 800.0000 mg | DELAYED_RELEASE_CAPSULE | Freq: Three times a day (TID) | ORAL | 2 refills | Status: DC

## 2016-06-19 MED ORDER — DICYCLOMINE HCL 10 MG PO CAPS: 10.0000 mg | ORAL_CAPSULE | Freq: Three times a day (TID) | ORAL | 4 refills | Status: DC | PRN

## 2016-06-19 NOTE — Progress Notes (Signed)
Reviewed and agree with documentation and assessment and plan. K. Veena Nyeli Holtmeyer , MD   

## 2016-06-19 NOTE — Patient Instructions (Signed)
Your physician has requested that you go to the basement for lab work before leaving today.  We have sent the following medications to your pharmacy for you to pick up at your convenience: Delzicol and Bentyl.  Follow up as needed.

## 2016-06-19 NOTE — Progress Notes (Signed)
Subjective:    Patient ID: Nicole Reeves, female    DOB: 08-Jan-1986, 31 y.o.   MRN: [id number]  HPI Nicole Reeves is a pleasant 31 year old white female known to Dr. Lavon Paganini. She has a diagnosis of left-sided ulcerative colitis, and was last seen here in August 2017. She was initially diagnosed in 2016. She underwent colonoscopy in January 2017 with finding of a few aphthous ulcers in the left colon, the TI appeared normal as did the rest of the colon. Biopsies showed normal mucosa with the exception of the left colon which showed chronic active colitis. She had initially been given a trial of Lialda which caused problems with headaches. She has been on Delzicol  800 mg 3 times daily but says she had stopped taking it about 3 months ago because she was doing well, not having any problems and really does not like taking medications. She says about 3 or 4 weeks ago her symptoms flared up with abdominal cramping pain diarrhea and some rectal bleeding. He had a rough week about 2 weeks ago with increased abdominal cramping bleeding and diarrhea. She started back on Delzicol 800 mg 3 times daily and says she is feeling better at this point. She is having about 4 loose bowel movements per day but hasn't seen any blood in the past few days. She's also having less cramping. No fever or chills, no nausea or vomiting and is able to eat. She comes in to discuss medications. She also relates that she will be starting a new job in a couple of weeks and will change insurance so she doesn't want to make any major changes in her medications until she figures out but will be covered with her new insurance.  Review of Systems Pertinent positive and negative review of systems were noted in the above HPI section.  All other review of systems was otherwise negative.  Outpatient Encounter Prescriptions as of 06/19/2016  Medication Sig  . Mesalamine (DELZICOL) 400 MG CPDR DR capsule Take 2 capsules (800 mg total) by mouth  3 (three) times daily.  . Multiple Vitamin (MULTIVITAMIN WITH MINERALS) TABS tablet Take 1 tablet by mouth daily.  . [DISCONTINUED] DELZICOL 400 MG CPDR DR capsule TAKE 2 CAPSULES (800 MG TOTAL) BY MOUTH 3 (THREE) TIMES DAILY.  Marland Kitchen dicyclomine (BENTYL) 10 MG capsule Take 1 capsule (10 mg total) by mouth 3 (three) times daily as needed for spasms.  . [DISCONTINUED] Budesonide (UCERIS) 9 MG TB24 Take 1 tablet by mouth daily.  . [DISCONTINUED] mercaptopurine (PURINETHOL) 50 MG tablet Take 2 tablets (100 mg total) by mouth daily. Give on an empty stomach 1 hour before or 2 hours after meals. Caution: Chemotherapy.  . [DISCONTINUED] mesalamine (ROWASA) 4 g enema Place 60 mLs (4 g total) rectally at bedtime.  . [DISCONTINUED] predniSONE (DELTASONE) 10 MG tablet Take as directed   No facility-administered encounter medications on file as of 06/19/2016.    No Known Allergies Patient Active Problem List   Diagnosis Date Noted  . Diarrhea   . Blood in stool   . Hyponatremia 12/07/2014  . Acute colitis 12/07/2014   Social History   Social History  . Marital status: Single    Spouse name: N/A  . Number of children: N/A  . Years of education: N/A   Occupational History  . [address] [employer]    Social History Main Topics  . Smoking status: Current Every Day Smoker    Packs/day: 1.00    Types: Cigarettes  .  Smokeless tobacco: Never Used  . Alcohol use No  . Drug use: No     Comment: not used for a year  . Sexual activity: Not Currently   Other Topics Concern  . Not on file   Social History Narrative  . No narrative on file    [name]'s family history is not on file.      Objective:    Vitals:   06/19/16 0835  BP: 112/62  Pulse: 72    Physical Exam  well-developed young white female in no acute distress, pleasant blood pressure 112/62 pulse 72, BMI 22.8. HEENT; nontraumatic normocephalic EOMI PERRLA sclera anicteric, Cardiovascular; regular rate and rhythm with S1-S2 no  murmur rub or gallop, Pulmonary; clear bilaterally, Abdomen; soft, she has mild tenderness in the left lower quadrant there is no guarding or rebound no palpable mass or hepatosplenomegaly bowel sounds are present, Rectal ;exam not done, Extremities; no clubbing cyanosis or edema skin warm and dry, Neuropsych; mood and affect appropriate       Assessment & Plan:   #39 31 year old white female with left-sided ulcerative colitis with recent exacerbation and dairy to noncompliance with medication which she had been off of for about 3 months. Patient improving back on Delzicol  800 mg 3 times daily over the past week and a half.  Plan; We discussed rationale for maintenance medication in the setting of IBD and importance of compliance with medications for good control of chronic disease. We'll check CBC with differential, CRP and sedimentation rate, Continue the Asacol 800 mg by mouth 3 times daily Start Bentyl 10 mg by mouth 3 times daily as needed for cramping and pain. Patient was asked to call after her insurance switches and she sorts out which mesalamine will be covered. Again she did not tolerate Lialda secondary to headaches. Plan follow-up with Dr. Lavon Paganini or myself in 4-6 months.   Jamaris Biernat S Kaye Mitro PA-C 06/19/2016   Cc: No ref. provider found

## 2016-06-20 LAB — CYTOLOGY - PAP

## 2016-06-25 ENCOUNTER — Telehealth: Payer: Self-pay | Admitting: Gastroenterology

## 2016-06-25 NOTE — Telephone Encounter (Signed)
Patient was told by her OB-GYN she has Hepatitis C. The patient requested her results be sent to [place]. She is wanting to know if Dr Lavon Paganini thought she should be referred to a liver specialist. The results will arrive via fax.

## 2016-06-25 NOTE — Telephone Encounter (Signed)
She goes to [facility]. Will call for the labs.

## 2016-06-25 NOTE — Telephone Encounter (Signed)
Please obtain the results from Hep C testing, if not already checked will need to check Hep C viral load and genotype and will refer to liver clinic or ID for Hep C management.

## 2016-06-28 ENCOUNTER — Encounter: Payer: Self-pay | Admitting: Internal Medicine

## 2016-06-28 ENCOUNTER — Ambulatory Visit (INDEPENDENT_AMBULATORY_CARE_PROVIDER_SITE_OTHER): Payer: BLUE CROSS/BLUE SHIELD | Admitting: Internal Medicine

## 2016-06-28 VITALS — BP 115/78 | HR 90 | Temp 98.1°F | Ht 67.0 in | Wt 143.0 lb

## 2016-06-28 DIAGNOSIS — Z23 Encounter for immunization: Secondary | ICD-10-CM

## 2016-06-28 DIAGNOSIS — Z7185 Encounter for immunization safety counseling: Secondary | ICD-10-CM

## 2016-06-28 DIAGNOSIS — B182 Chronic viral hepatitis C: Secondary | ICD-10-CM

## 2016-06-28 DIAGNOSIS — Z7189 Other specified counseling: Secondary | ICD-10-CM

## 2016-06-28 LAB — CBC WITH DIFFERENTIAL/PLATELET
BASOS PCT: 0 %
Basophils Absolute: 0 cells/uL (ref 0–200)
Eosinophils Absolute: 292 cells/uL (ref 15–500)
Eosinophils Relative: 4 %
HEMATOCRIT: 38.6 % (ref 35.0–45.0)
HEMOGLOBIN: 12.8 g/dL (ref 11.7–15.5)
LYMPHS ABS: 1825 {cells}/uL (ref 850–3900)
LYMPHS PCT: 25 %
MCH: 31.1 pg (ref 27.0–33.0)
MCHC: 33.2 g/dL (ref 32.0–36.0)
MCV: 93.7 fL (ref 80.0–100.0)
MONO ABS: 365 {cells}/uL (ref 200–950)
MPV: 8.9 fL (ref 7.5–12.5)
Monocytes Relative: 5 %
Neutro Abs: 4818 cells/uL (ref 1500–7800)
Neutrophils Relative %: 66 %
Platelets: 304 10*3/uL (ref 140–400)
RBC: 4.12 MIL/uL (ref 3.80–5.10)
RDW: 13.1 % (ref 11.0–15.0)
WBC: 7.3 10*3/uL (ref 3.8–10.8)

## 2016-06-28 LAB — COMPLETE METABOLIC PANEL WITH GFR
ALBUMIN: 4.1 g/dL (ref 3.6–5.1)
ALT: 64 U/L — ABNORMAL HIGH (ref 6–29)
AST: 40 U/L — ABNORMAL HIGH (ref 10–30)
Alkaline Phosphatase: 54 U/L (ref 33–115)
BUN: 12 mg/dL (ref 7–25)
CO2: 26 mmol/L (ref 20–31)
Calcium: 9.7 mg/dL (ref 8.6–10.2)
Chloride: 103 mmol/L (ref 98–110)
Creat: 0.63 mg/dL (ref 0.50–1.10)
Glucose, Bld: 70 mg/dL (ref 65–99)
Potassium: 4 mmol/L (ref 3.5–5.3)
Sodium: 139 mmol/L (ref 135–146)
TOTAL PROTEIN: 7.5 g/dL (ref 6.1–8.1)
Total Bilirubin: 0.3 mg/dL (ref 0.2–1.2)

## 2016-06-28 NOTE — Addendum Note (Signed)
Addended by: Myrtis Hopping A on: 06/28/2016 03:23 PM   Modules accepted: Orders

## 2016-06-28 NOTE — Patient Instructions (Signed)
Date 06/28/16  Dear Ms Nicole Reeves, As discussed in the [facility], your hepatitis C therapy will include highly effective medication(s) for treatment and will vary based on the type of hepatitis C and insurance approval.  Potential medications include:          Harvoni (sofosbuvir 70m/ledipasvir 4075m tablet oral daily          OR     Epclusa (sofosbuvir 40024melpatasvir 100m67mablet oral daily          OR      Mavyret (glecaprevir 100 mg/pibrentasvir 40 mg): Take 3 tablets oral daily          OR     Zepatier (elbasvir 50 mg/grazoprevir 100 mg) oral daily, +/- ribavirin              Medications are typically for 8 or 12 weeks total ---------------------------------------------------------------- Your HCV Treatment Start Date: You will be notified by our office once the medication is approved and where you can pick it up (or if mailed)   ---------------------------------------------------------------- [facility] [place] [id number]: [phone]: Monday to Friday 7:30 am to 6:00 pm   Please always contact your pharmacy at least 3-4 business days before you run out of medications to ensure your next month's medication is ready or 1 week prior to running out if you receive it by mail.  Remember, each prescription is for 28 days. ---------------------------------------------------------------- GENERAL NOTES REGARDING YOUR HEPATITIS C MEDICATION:  Some medications have the following interactions:  - Acid reducing agents such as H2 blockers (ie. Pepcid (famotidine), Zantac (ranitidine), Tagamet (cimetidine), Axid (nizatidine) and proton pump inhibitors (ie. Prilosec (omeprazole), Protonix (pantoprazole), Nexium (esomeprazole), or Aciphex (rabeprazole)). Do not take until you have discussed with a health care provider.    -Antacids that contain magnesium and/or aluminum hydroxide (ie. Milk of Magensia, Rolaids,  Gaviscon, Maalox, Mylanta, an dArthritis Pain Formula).  -Calcium carbonate (calcium supplements or antacids such as Tums, Caltrate, Os-Cal).  -St. John's wort or any products that contain St. John's wort like some herbal supplements  Please inform the office prior to starting any of these medications.  - The common side effects associated with Harvoni include:      1. Fatigue      2. Headache      3. Nausea      4. Diarrhea      5. Insomnia  Please note that this only lists the most common side effects and is NOT a comprehensive list of the potential side effects of these medications. For more information, please review the drug information sheets that come with your medication package from the pharmacy.  ---------------------------------------------------------------- GENERAL HELPFUL HINTS ON HCV THERAPY: 1. Stay well-hydrated. 2. Notify the [facility] of any changes in your other over-the-counter/herbal or prescription medications. 3. If you miss a dose of your medication, take the missed dose as soon as you remember. Return to your regular time/dose schedule the next day.  4.  Do not stop taking your medications without first talking with your healthcare provider. 5.  You may take Tylenol (acetaminophen), as long as the dose is less than 2000 mg (OR no more than 4 tablets of the Tylenol Extra Strengths 500mg59mlet) in 24 hours. 6.  You will see our pharmacist-specialist within the first 2 weeks of starting your medication to monitor for any possible side effects. 7.  You will have labs once during treatment, soon  after treatment completion and one final lab 6 months after treatment completion to verify the virus is out of your system.  Nicole Reeves, [place] for [place] [place], [place]  [phone]

## 2016-06-28 NOTE — Progress Notes (Signed)
[facility]   CC: consideration for treatment for chronic hepatitis C  HPI:  +Nicole Reeves is a 31 y.o. female who presents for initial evaluation and management of chronic hepatitis C.  Patient tested positive earlier this year during routine screening. Hepatitis C-associated risk factors present are: IV drug abuse (details: last used 2 years ago). Patient denies history of blood transfusion, sexual contact with person with liver disease. Patient has had other studies performed. Results: hepatitis C RNA by PCR, result: positive. Patient has not had prior treatment for Hepatitis C. Patient does not have a past history of liver disease. Patient does not have a family history of liver disease. Patient does not  have associated signs or symptoms related to liver disease.  Labs reviewed and confirm chronic hepatitis C with a positive viral load.   Records reviewed from Epic and she follows [facility] for ulcerative colitis.  Had previous hepatitis B serology done and negative HIV in 2016 but never tested for hepatitis C.  HIV negative and positive viral load from her PCP from 06/21/16.       Patient does not have documented immunity to Hepatitis A. Patient does not have documented immunity to Hepatitis B.    Review of Systems:  Constitutional: negative for fatigue and malaise Gastrointestinal: negative for diarrhea Integument/breast: negative for rash Musculoskeletal: negative for myalgias and arthralgias All other systems reviewed and are negative       Past Medical History:  Diagnosis Date  . Colitis 11/2014   acute onset bloody diarrhea. C diff, O&P negative.   . Left sided ulcerative colitis ([place])   . Polysubstance abuse 02/24/2014   tox screen + for THC, Barbiturates, amphetimine.    Prior to Admission medications   Medication Sig Start Date End Date Taking? Authorizing Provider  dicyclomine (BENTYL) 10 MG capsule Take 1 capsule (10 mg total) by  mouth 3 (three) times daily as needed for spasms. 06/19/16  Yes Amy S Esterwood, PA-C  Mesalamine (DELZICOL) 400 MG CPDR DR capsule Take 2 capsules (800 mg total) by mouth 3 (three) times daily. 06/19/16  Yes Amy S Esterwood, PA-C  Multiple Vitamin (MULTIVITAMIN WITH MINERALS) TABS tablet Take 1 tablet by mouth daily. 12/13/14  Yes Elmarie Shiley, MD    No Known Allergies  Social History  Substance Use Topics  . Smoking status: Current Every Day Smoker    Packs/day: 0.25    Types: Cigarettes, E-cigarettes  . Smokeless tobacco: Never Used  . Alcohol use No    Family History  Problem Relation Age of Onset  . Colon cancer Neg Hx   . Esophageal cancer Neg Hx   . Rectal cancer Neg Hx   . Stomach cancer Neg Hx   no liver cancer, no cirrhosis   Objective:  Constitutional: in no apparent distress and alert,  Vitals:   06/28/16 1422  BP: 115/78  Pulse: 90  Temp: 98.1 F (36.7 C)   Eyes: anicteric Cardiovascular: Cor RRR and No murmurs Respiratory: CTA B; normal respiratory effort Gastrointestinal: Bowel sounds are normal, liver is not enlarged, spleen is not enlarged, soft, nt Musculoskeletal: no pedal edema noted Skin: negatives: no rash; no porphyria cutanea tarda Lymphatic: no cervical lymphadenopathy   Laboratory Genotype: No results found for: HCVGENOTYPE HCV viral load: No results found for: HCVQUANT Lab Results  Component Value Date   WBC 7.2 06/19/2016   HGB 13.6 06/19/2016   HCT 39.6 06/19/2016   MCV 93.1 06/19/2016  PLT 294.0 06/19/2016    Lab Results  Component Value Date   CREATININE 0.46 12/13/2014   BUN 9 12/13/2014   NA 141 12/13/2014   K 3.8 12/13/2014   CL 106 12/13/2014   CO2 29 12/13/2014    Lab Results  Component Value Date   ALT 37 12/08/2014   AST 21 12/08/2014   ALKPHOS 95 12/08/2014     Labs and history reviewed and show CHILD-PUGH unknown  5-6 points: Child class A 7-9 points: Child class B 10-15 points: Child class C  Lab  Results  Component Value Date   BILITOT 0.9 12/08/2014   ALBUMIN 2.9 (L) 12/08/2014     Assessment: New Patient with Chronic Hepatitis C genotype unknown, untreated.  I discussed with the patient the lab findings that confirm chronic hepatitis C as well as the natural history and progression of disease including about 30% of people who develop cirrhosis of the liver if left untreated and once cirrhosis is established there is a 2-7% risk per year of liver cancer and liver failure.  I discussed the importance of treatment and benefits in reducing the risk, even if significant liver fibrosis exists.   Plan: 1) Patient counseled extensively on limiting acetaminophen to no more than 2 grams daily, avoidance of alcohol. 2) Transmission discussed with patient including sexual transmission, sharing razors and toothbrush.   3) Will need referral to gastroenterology if concern for cirrhosis 4) Will need referral for substance abuse counseling: No.; Further work up to include urine drug screen  No. 5) Will prescribe appropriate medication based on genotype and coverage  6) Hepatitis A and B titers 7) Pneumovax vaccine today 9) Further work up to include liver staging with elastography 10) will follow up after starting medication - she will call our office when she has her new insurance with [employer] to start the approval process with that, which will be in about 30 days.

## 2016-06-28 NOTE — Telephone Encounter (Signed)
Spoke with her GYN office. Requested the labs be faxed.

## 2016-06-29 LAB — HEPATITIS A ANTIBODY, TOTAL: HEP A TOTAL AB: NONREACTIVE

## 2016-06-29 LAB — HEPATITIS B SURFACE ANTIBODY,QUALITATIVE

## 2016-06-29 LAB — HEPATITIS B CORE ANTIBODY, TOTAL: HEP B C TOTAL AB: NONREACTIVE

## 2016-06-29 LAB — PROTIME-INR
INR: 0.9
PROTHROMBIN TIME: 10.1 s (ref 9.0–11.5)

## 2016-06-29 LAB — HEPATITIS B SURFACE ANTIGEN: HEP B S AG: NEGATIVE

## 2016-07-02 LAB — HEPATITIS C GENOTYPE: HCV GENOTYPE: 3

## 2016-07-02 NOTE — Telephone Encounter (Signed)
Patient has been referred to a hepatologist. Confirmed with the patient.

## 2016-07-03 ENCOUNTER — Other Ambulatory Visit: Payer: Self-pay | Admitting: Internal Medicine

## 2016-07-03 LAB — LIVER FIBROSIS, FIBROTEST-ACTITEST
ALPHA-2-MACROGLOBULIN: 247 mg/dL (ref 106–279)
ALT: 63 U/L — ABNORMAL HIGH (ref 6–29)
Apolipoprotein A1: 145 mg/dL (ref 101–198)
BILIRUBIN: 0.3 mg/dL (ref 0.2–1.2)
FIBROSIS SCORE: 0.05
GGT: 11 U/L (ref 3–40)
Haptoglobin: 300 mg/dL — ABNORMAL HIGH (ref 43–212)
NECROINFLAMMAT ACT SCORE: 0.29
Reference ID: 1934550

## 2016-07-03 MED ORDER — SOFOSBUVIR-VELPATASVIR 400-100 MG PO TABS: 1.0000 | ORAL_TABLET | Freq: Every day | ORAL | 2 refills | Status: DC

## 2016-07-06 ENCOUNTER — Encounter (HOSPITAL_COMMUNITY): Payer: Self-pay

## 2016-07-06 ENCOUNTER — Emergency Department (HOSPITAL_COMMUNITY)
Admission: EM | Admit: 2016-07-06 | Discharge: 2016-07-07 | Disposition: A | Discharge status: Home / Self Care | Payer: BLUE CROSS/BLUE SHIELD | Attending: Emergency Medicine | Admitting: Emergency Medicine

## 2016-07-06 DIAGNOSIS — M79604 Pain in right leg: Secondary | ICD-10-CM

## 2016-07-06 DIAGNOSIS — M79605 Pain in left leg: Secondary | ICD-10-CM

## 2016-07-06 DIAGNOSIS — M79662 Pain in left lower leg: Secondary | ICD-10-CM | POA: Insufficient documentation

## 2016-07-06 DIAGNOSIS — F17210 Nicotine dependence, cigarettes, uncomplicated: Secondary | ICD-10-CM | POA: Insufficient documentation

## 2016-07-06 DIAGNOSIS — M79661 Pain in right lower leg: Secondary | ICD-10-CM | POA: Diagnosis not present

## 2016-07-06 HISTORY — DX: Unspecified viral hepatitis C without hepatic coma: B19.20

## 2016-07-06 LAB — POC URINE PREG, ED: Preg Test, Ur: NEGATIVE

## 2016-07-06 NOTE — ED Triage Notes (Signed)
[facility]; pt states she has knots on bilateral legs with some swelling; pt states she noticed it on Tuesday; Pt states pain at 7/10 on arrival. Pt denies long travel recently; pt states she changed jobs from walking frequent to sitting more; pt a&ox 4 on arrival. Pt denies SOB;

## 2016-07-07 NOTE — Discharge Instructions (Signed)
Continue to take Tylenol for pain. If symptoms change - you develop a fever, nausea, spreading nodules, drainage - return to the emergency department. Otherwise, follow up with dermatology or with your primary care physician for further evaluation in the outpatient setting. Thank you for your patience today!

## 2016-07-07 NOTE — ED Provider Notes (Signed)
[facility]-EMERGENCY DEPT Provider Note   CSN: [id number] Arrival date & time: 07/06/16  1913     History   Chief Complaint Chief Complaint  Patient presents with  . DVT    HPI Nicole Reeves is a 31 y.o. female.  Patient with a history of ulcerative colitis, recently diagnosed Hep C presents with painful areas to bilateral lower extremities that started 4 days ago. She notes that symptoms initially included generalized LE swelling that has improved. No fever, nausea, SOB, CP or abdominal pain. She was seen at Urgent Care and sent here for further evaluation. No modifying factors. Tylenol relieves the pain.    The history is provided by the patient. No language interpreter was used.    Past Medical History:  Diagnosis Date  . Colitis 11/2014   acute onset bloody diarrhea. C diff, O&P negative.   . Hepatitis C 04/2016  . Left sided ulcerative colitis (HCC)   . Polysubstance abuse 02/24/2014   tox screen + for THC, Barbiturates, amphetimine.    Patient Active Problem List   Diagnosis Date Noted  . Chronic hepatitis C without hepatic coma (HCC) 06/28/2016  . Vaccine counseling 06/28/2016  . Diarrhea   . Blood in stool   . Hyponatremia 12/07/2014  . Acute colitis 12/07/2014    Past Surgical History:  Procedure Laterality Date  . FLEXIBLE SIGMOIDOSCOPY N/A 12/10/2014   Procedure: FLEXIBLE SIGMOIDOSCOPY;  Surgeon: Napoleon Form, MD;  Location: [facility] ENDOSCOPY;  Service: Endoscopy;  Laterality: N/A;    OB History    No data available       Home Medications    Prior to Admission medications   Medication Sig Start Date End Date Taking? Authorizing Provider  dicyclomine (BENTYL) 10 MG capsule Take 1 capsule (10 mg total) by mouth 3 (three) times daily as needed for spasms. 06/19/16   Esterwood, Amy S, PA-C  Mesalamine (DELZICOL) 400 MG CPDR DR capsule Take 2 capsules (800 mg total) by mouth 3 (three) times daily. 06/19/16   Esterwood, Amy S, PA-C  Multiple Vitamin  (MULTIVITAMIN WITH MINERALS) TABS tablet Take 1 tablet by mouth daily. 12/13/14   Regalado, Belkys A, MD  Sofosbuvir-Velpatasvir (EPCLUSA) 400-100 MG TABS Take 1 tablet by mouth daily. 07/03/16   Comer, Belia Heman, MD    Family History Family History  Problem Relation Age of Onset  . Colon cancer Neg Hx   . Esophageal cancer Neg Hx   . Rectal cancer Neg Hx   . Stomach cancer Neg Hx     Social History Social History  Substance Use Topics  . Smoking status: Current Every Day Smoker    Packs/day: 0.25    Types: Cigarettes, E-cigarettes  . Smokeless tobacco: Never Used  . Alcohol use No     Allergies   Patient has no known allergies.   Review of Systems Review of Systems  Constitutional: Negative for fever.  Respiratory: Negative for shortness of breath.   Cardiovascular: Negative for chest pain.  Gastrointestinal: Negative for abdominal pain and nausea.  Musculoskeletal:       LE pain and swelling.  Skin:       See HPI.  Neurological: Negative for weakness and numbness.     Physical Exam Updated Vital Signs BP 121/80   Pulse 73   Temp 98 F (36.7 C) (Oral)   Resp 18   LMP 06/27/2016 (Approximate)   SpO2 100%   Physical Exam  Constitutional: She is oriented to person, place, and time. She  appears well-developed and well-nourished.  Neck: Normal range of motion.  Cardiovascular: Intact distal pulses.   Pulmonary/Chest: Effort normal.  Musculoskeletal:       Legs: Two nodular areas measuring 1-2 cm in diameter that are slightly erythematous and tender. No fluctuance, pustule or blistering. No skin breakdown. No other LE swelling or tenderness.   Neurological: She is alert and oriented to person, place, and time.  Skin: Skin is warm and dry.     ED Treatments / Results  Labs (all labs ordered are listed, but only abnormal results are displayed) Labs Reviewed  POC URINE PREG, ED    EKG  EKG Interpretation None       Radiology No results  found.  Procedures Procedures (including critical care time)  Medications Ordered in ED Medications - No data to display   Initial Impression / Assessment and Plan / ED Course  I have reviewed the triage vital signs and the nursing notes.  Pertinent labs & imaging results that were available during my care of the patient were reviewed by me and considered in my medical decision making (see chart for details).     Patient presents with concern for DVT. Not likely based on exam findings of nodules, no calf tenderness or LE swelling. Considerations included abscesses, possible MRSA, but symptoms are improving and there is no fluctuance. Polyarteritis nodosa, erythema nodosum are also considered but work up is appropriate in the outpatient setting. She will continue Tylenol. Return precautions of fever, vomiting, LE swelling, new concern was discussed.  Final Clinical Impressions(s) / ED Diagnoses   Final diagnoses:  None   1. LE nodules  New Prescriptions New Prescriptions   No medications on file     Danne HarborUpstill, Tandra Rosado, PA-C 07/07/16 0031    Melene PlanFloyd, Dan, DO 07/07/16 [phone]

## 2016-07-09 ENCOUNTER — Other Ambulatory Visit: Payer: Self-pay | Admitting: Pharmacist

## 2016-07-09 DIAGNOSIS — B182 Chronic viral hepatitis C: Secondary | ICD-10-CM

## 2016-07-09 MED ORDER — SOFOSBUVIR-VELPATASVIR 400-100 MG PO TABS: 1.0000 | ORAL_TABLET | Freq: Every day | ORAL | 2 refills | Status: DC

## 2016-07-10 ENCOUNTER — Telehealth: Payer: Self-pay | Admitting: Gastroenterology

## 2016-07-10 NOTE — Telephone Encounter (Signed)
Seen in the ER for 2 areas on her LE described as "sores". ER has referred her to Dermatology. The patient is concerned this issue could be related to her IBD. She has not cancelled the derm appointment, but would like to get the opinion of someone in GI. Appointment set for evaluation. She will cancel the appointment if she heals before the appointment.

## 2016-07-18 ENCOUNTER — Ambulatory Visit (INDEPENDENT_AMBULATORY_CARE_PROVIDER_SITE_OTHER): Payer: BLUE CROSS/BLUE SHIELD | Admitting: Physician Assistant

## 2016-07-18 ENCOUNTER — Ambulatory Visit (HOSPITAL_COMMUNITY)
Admission: RE | Admit: 2016-07-18 | Discharge: 2016-07-18 | Disposition: A | Discharge status: Home / Self Care | Payer: BLUE CROSS/BLUE SHIELD | Source: Ambulatory Visit | Attending: Internal Medicine | Admitting: Internal Medicine

## 2016-07-18 ENCOUNTER — Other Ambulatory Visit: Payer: Self-pay | Admitting: *Deleted

## 2016-07-18 ENCOUNTER — Encounter: Payer: Self-pay | Admitting: Physician Assistant

## 2016-07-18 VITALS — BP 100/60 | HR 72 | Ht 67.0 in | Wt 139.0 lb

## 2016-07-18 DIAGNOSIS — K51518 Left sided colitis with other complication: Secondary | ICD-10-CM

## 2016-07-18 DIAGNOSIS — B182 Chronic viral hepatitis C: Secondary | ICD-10-CM | POA: Diagnosis present

## 2016-07-18 DIAGNOSIS — L52 Erythema nodosum: Secondary | ICD-10-CM

## 2016-07-18 DIAGNOSIS — K51918 Ulcerative colitis, unspecified with other complication: Secondary | ICD-10-CM

## 2016-07-18 MED ORDER — NA SULFATE-K SULFATE-MG SULF 17.5-3.13-1.6 GM/177ML PO SOLN: 1.0000 | Freq: Once | ORAL | 0 refills | Status: AC

## 2016-07-18 NOTE — Progress Notes (Signed)
Chief Complaint: Ulcerative Colitis, Skin Lesions  HPI:  [name] is a 31 year old Caucasian female known to Dr. Lavon Paganini with a diagnosis of left-sided ulcerative colitis, who presents to clinic today for evaluation of skin lesions.     Please recall patient was last seen in clinic on 06/19/16 by Mike Gip, PA-C. It was discussed that she was originally diagnosed in 2016 and underwent colonoscopy in January 2017 with a finding of a few aphthous ulcers in the left colon, TIA normal as was the rest of the colon. She had biopsies which showed chronic active colitis. She was given a trial of Lialda which cause problems with headaches and she was started on Delzicol 800 mg 3 times a day but had stopped taking that 3 months previously because she was doing so well. When she presented she described 3-4 weeks ago that her symptoms flared with abdominal cramping pain and diarrhea and some rectal bleeding. She had started back on her Delzicol and was feeling better. She was having 4 loose bowel movements a day and less cramping. Patient was instructed that she needed to continue her maintenance medication of Delzicol 800 mg 3 times a day, CBC CRP and sedimentation rate were checked and patient was started on Bentyl 10 mg 3 times a day for cramping pain. She was instructed to follow clinically with Dr. Lavon Paganini or Amy in 4-6 months.   Patient was recently seen in the ER in 07/06/16 with pain in her lower extremities. Review of that note shows the pain started 4 days previous with generalized lower extremity swelling that had improved. She was found to have 2 nodular areas measuring 1-2 cm in diameter that were slightly erythematous and tender. There was concern for DVT but this was thought not likely due to exam findings. Erythema nodosum and polyarteritis nodosa were considered. Patient was sent referral to dermatology for further eval.   Today, the patient presents to clinic accompanied by her friend and tells  me that about 2 weeks ago she had a red raised painful bump appear on the back of her right leg, this made her leg ache and then she started to have another red raised painful lesion on her left leg on the front side, patient states she went to the ER as above and was told to go the dermatologist but has not yet scheduled appointment as she started reading about possible erythema nodosum which would be related to her GI symptoms. Patient tells me that the pain has started decreasing but these lesions have multiplied. She has not been doing anything for this other than using some Tylenol occasionally.   The patient tells me that as far as her ulcerative colitis is concerned she is doing well with no further abdominal pain, blood in her stool or diarrhea. She is currently taking her Delzicol 400 mg 2 capsules 3 times a day.   Patient denies fever, chills, weight loss, anorexia, nausea and vomiting, heartburn, reflux or symptoms that awaken her at night.  Past Medical History:  Diagnosis Date  . Colitis 11/2014   acute onset bloody diarrhea. C diff, O&P negative.   . Hepatitis C 04/2016  . Left sided ulcerative colitis (HCC)   . Polysubstance abuse 02/24/2014   tox screen + for THC, Barbiturates, amphetimine.    Past Surgical History:  Procedure Laterality Date  . FLEXIBLE SIGMOIDOSCOPY N/A 12/10/2014   Procedure: FLEXIBLE SIGMOIDOSCOPY;  Surgeon: Napoleon Form, MD;  Location: [facility];  Service: Endoscopy;  Laterality: N/A;    Current Outpatient Prescriptions  Medication Sig Dispense Refill  . dicyclomine (BENTYL) 10 MG capsule Take 1 capsule (10 mg total) by mouth 3 (three) times daily as needed for spasms. 90 capsule 4  . Mesalamine (DELZICOL) 400 MG CPDR DR capsule Take 2 capsules (800 mg total) by mouth 3 (three) times daily. 180 capsule 2  . Multiple Vitamin (MULTIVITAMIN WITH MINERALS) TABS tablet Take 1 tablet by mouth daily. 30 tablet 0  . Sofosbuvir-Velpatasvir (EPCLUSA)  400-100 MG TABS Take 1 tablet by mouth daily. 28 tablet 2   No current facility-administered medications for this visit.     Allergies as of 07/18/2016  . (No Known Allergies)    Family History  Problem Relation Age of Onset  . Colon cancer Neg Hx   . Esophageal cancer Neg Hx   . Rectal cancer Neg Hx   . Stomach cancer Neg Hx     Social History   Social History  . Marital status: Single    Spouse name: N/A  . Number of children: N/A  . Years of education: N/A   Occupational History  . [address] [employer]    Social History Main Topics  . Smoking status: Current Every Day Smoker    Packs/day: 0.25    Types: Cigarettes, E-cigarettes  . Smokeless tobacco: Never Used  . Alcohol use No  . Drug use: No     Comment: not used since 02/2014  . Sexual activity: Not Currently   Other Topics Concern  . Not on file   Social History Narrative  . No narrative on file    Review of Systems:    Constitutional: No weight loss, fever or chills Skin: See HPI Cardiovascular: No chest pain Respiratory: No SOB  Gastrointestinal: See HPI and otherwise negative   Physical Exam:  Vital signs: BP 100/60   Pulse 72   Ht 5\' 7"  (1.702 m)   Wt 139 lb (63 kg)   LMP 06/27/2016 (Approximate)   BMI 21.77 kg/m    Constitutional:   Pleasant Caucasian female appears to be in NAD, Well developed, Well nourished, alert and cooperative Respiratory: Respirations even and unlabored. Lungs clear to auscultation bilaterally.   No wheezes, crackles, or rhonchi.  Cardiovascular: Normal S1, S2. No MRG. Regular rate and rhythm. No peripheral edema, cyanosis or pallor.  Gastrointestinal:  Soft, nondistended, nontender. No rebound or guarding. Normal bowel sounds. No appreciable masses or hepatomegaly. Skin:   Dry and intact. 4 cm in diameter red raised painful lesion on the extensor surface of patient's right bottom leg, another lesion on the side of patient's left leg as well as one down towards her  ankle, erythematous Psychiatric:  Demonstrates good judgement and reason without abnormal affect or behaviors.  MOST RECENT LABS AND IMAGING: CBC    Component Value Date/Time   WBC 7.3 06/28/2016 1504   RBC 4.12 06/28/2016 1504   HGB 12.8 06/28/2016 1504   HCT 38.6 06/28/2016 1504   PLT 304 06/28/2016 1504   MCV 93.7 06/28/2016 1504   MCH 31.1 06/28/2016 1504   MCHC 33.2 06/28/2016 1504   RDW 13.1 06/28/2016 1504   LYMPHSABS 1,825 06/28/2016 1504   MONOABS 365 06/28/2016 1504   EOSABS 292 06/28/2016 1504   BASOSABS 0 06/28/2016 1504    CMP     Component Value Date/Time   NA 139 06/28/2016 1504   K 4.0 06/28/2016 1504   CL 103 06/28/2016 1504   CO2 26 06/28/2016  1504   GLUCOSE 70 06/28/2016 1504   BUN 12 06/28/2016 1504   CREATININE 0.63 06/28/2016 1504   CALCIUM 9.7 06/28/2016 1504   PROT 7.5 06/28/2016 1504   ALBUMIN 4.1 06/28/2016 1504   AST 40 (H) 06/28/2016 1504   ALT 64 (H) 06/28/2016 1504   ALT 63 (H) 06/28/2016 1504   ALKPHOS 54 06/28/2016 1504   BILITOT 0.3 06/28/2016 1504   GFRNONAA >89 06/28/2016 1504   GFRAA >89 06/28/2016 1504    Assessment: 1. Ulcerative colitis:Patient describes that this is well controlled now on Delzicol 800 milligrams 3 times a day 2. Erythema Nodosum: Patient with painful red raised lesion on her legs which came up about 2 weeks ago, now not as painful but has had 2 more spots appear in the past week  Plan: 1. Discussed with Dr. Lavon PaganiniNandigam at the time of patient's appointment. She is concerned regarding patient's underlying inflammatory bowel disease. She would like to repeat a colonoscopy for staging of patient's disease. Discussed risks, benefits, limitations and alternatives and the patient agrees proceed. This was scheduled in [facility] with Dr. Lavon PaganiniNandigam 2. Patient to continue her Delzicol 800 mg 3 times a day in the meantime 3. Recommend the patient keep her legs raised and compressed with a warm compress and rest as much as possible,  she can continue Tylenol for pain, she should avoid NSAIDs due to IBD 4. If the patient's lesions persist after 6 weeks I recommend she call a dermatologist for further evaluation and possible biopsy 5. Patient to return to clinic per Dr. Alison StallingNandigam't recommendations after time of procedure.  Hyacinth MeekerJennifer Kashmir Lysaght, PA-C [facility] 07/18/2016, 1:10 PM  Cc: No ref. provider found

## 2016-07-26 NOTE — Progress Notes (Signed)
Reviewed and agree with documentation and assessment and plan. K. Veena Nandigam , MD   

## 2016-08-07 ENCOUNTER — Other Ambulatory Visit: Payer: Self-pay | Admitting: Gastroenterology

## 2016-08-07 ENCOUNTER — Telehealth: Payer: Self-pay | Admitting: Gastroenterology

## 2016-08-07 MED ORDER — MESALAMINE ER 0.375 G PO CP24: 1.5000 g | ORAL_CAPSULE | Freq: Every day | ORAL | 3 refills | Status: DC

## 2016-08-07 NOTE — Telephone Encounter (Signed)
Patient states that she forgot to tell our office that she is almost out of medication.

## 2016-08-07 NOTE — Telephone Encounter (Signed)
Please check If they will cover Lialda or apriso? If not please send prescription for sulfasalazine and folic acid. thanks

## 2016-08-07 NOTE — Telephone Encounter (Signed)
Insurance may cover Apriso I know they will not cover lialda, How do you want me to prescribe the apriso

## 2016-08-07 NOTE — Telephone Encounter (Signed)
Sent Apriso to [facility] on [address] per pt request.   If they will not cover apriso I told pt to contact her insurance and find out what they will cover.

## 2016-08-07 NOTE — Telephone Encounter (Signed)
Apriso 375 mg X 4 tabs (1.5gm) daily. Thanks

## 2016-08-07 NOTE — Telephone Encounter (Signed)
Dr Nandigam please advise 

## 2016-08-13 ENCOUNTER — Other Ambulatory Visit: Payer: Self-pay | Admitting: Pharmacist Clinician (PhC)/ Clinical Pharmacy Specialist

## 2016-08-13 MED ORDER — SOFOSBUVIR-VELPATASVIR 400-100 MG PO TABS: 1.0000 | ORAL_TABLET | Freq: Every day | ORAL | 2 refills | Status: DC

## 2016-08-13 MED FILL — EPCLUSA 400 MG-100 MG TAB: 400-100 | 28 days supply | Qty: 28 | Fill #0

## 2016-08-23 ENCOUNTER — Other Ambulatory Visit: Payer: Self-pay | Admitting: Pharmacist

## 2016-08-23 DIAGNOSIS — B182 Chronic viral hepatitis C: Secondary | ICD-10-CM

## 2016-08-23 MED ORDER — SOFOSBUVIR-VELPATASVIR 400-100 MG PO TABS: 1.0000 | ORAL_TABLET | Freq: Every day | ORAL | 1 refills | Status: DC

## 2016-08-30 MED ORDER — SOFOSBUVIR-VELPATASVIR 400-100 MG PO TABS: 1.0000 | ORAL_TABLET | Freq: Every day | ORAL | 2 refills | Status: DC

## 2016-08-31 ENCOUNTER — Ambulatory Visit (INDEPENDENT_AMBULATORY_CARE_PROVIDER_SITE_OTHER): Payer: Managed Care, Other (non HMO) | Admitting: Pharmacist

## 2016-08-31 DIAGNOSIS — B182 Chronic viral hepatitis C: Secondary | ICD-10-CM | POA: Diagnosis not present

## 2016-08-31 NOTE — Patient Instructions (Addendum)
Continue to take your Epclusa every day! We will see you back at the end of your treatment on September 14th @ 11:30 AM.

## 2016-08-31 NOTE — Progress Notes (Signed)
HPI: Marcello MooresJacqueline Hooton is a 31 y.o. female who presents today for hepatitis C follow up.  Lab Results  Component Value Date   HCVGENOTYPE 3 06/28/2016    Allergies: No Known Allergies  Vitals:    Past Medical History: Past Medical History:  Diagnosis Date  . Colitis 11/2014   acute onset bloody diarrhea. C diff, O&P negative.   . Hepatitis C 04/2016  . Left sided ulcerative colitis (HCC)   . Polysubstance abuse 02/24/2014   tox screen + for THC, Barbiturates, amphetimine.    Social History: Social History   Social History  . Marital status: Single    Spouse name: N/A  . Number of children: 0  . Years of education: N/A   Occupational History  . [employer]     Social History Main Topics  . Smoking status: Former Smoker    Packs/day: 0.25    Types: Cigarettes, E-cigarettes  . Smokeless tobacco: Never Used  . Alcohol use No  . Drug use: No     Comment: not used since 02/2014  . Sexual activity: Not Currently   Other Topics Concern  . Not on file   Social History Narrative  . No narrative on file    Labs: Hep B S Ab (no units)  Date Value  06/28/2016 INDETER (A)   Hepatitis B Surface Ag (no units)  Date Value  06/28/2016 NEGATIVE    Lab Results  Component Value Date   HCVGENOTYPE 3 06/28/2016    No flowsheet data found.  AST (U/L)  Date Value  06/28/2016 40 (H)  12/08/2014 21  12/07/2014 20   ALT (U/L)  Date Value  06/28/2016 64 (H)  06/28/2016 63 (H)  12/08/2014 37  12/07/2014 43   INR (no units)  Date Value  06/28/2016 0.9    CrCl: CrCl cannot be calculated (Patient's most recent lab result is older than the maximum 21 days allowed.).  Fibrosis Score: F0/F1 as assessed by elastography   Child-Pugh Score: A  Previous Treatment Regimen: N/A  Assessment: Ms. Rogers BlockerFerris is a 31 year old female who presents today for hepatitis C follow up. She is currently on her first month of Dorita Fraypclusa, start date of 6/18. Since starting the medication  she has not missed any doses. She states she notices that her hair seems to be falling out more than usual. She relates this to flares of her ulcerative colitis as it has happened with previous flares prior to being on Epclusa. She would like to start taking biotin and a multivitamin. I advised her that this would be alright and she plans to separate them from her Epclusa.   I encouraged her to continue to take her medication daily and to call for a refill from her specialty pharmacy if she has not heard from them ~5-6 days prior to running out of medication. She confirmed she will do this.   Recommendations: - Continue Epclusa daily x 12 weeks - Check HCV RNA today - Follow up at end of treatment with pharmacy visit on 9/14 @ 11:30 AM - She will also need to complete her Hep A and B vaccination series at a future appointment  Casilda Carlsaylor Shardai Star, PharmD, BCPS PGY-2 Infectious Diseases Pharmacy Resident Pager: [phone] 08/31/2016, 12:10 PM

## 2016-09-07 ENCOUNTER — Encounter: Payer: BLUE CROSS/BLUE SHIELD | Admitting: Gastroenterology

## 2016-09-07 LAB — HEPATITIS C RNA QUANTITATIVE
HCV QUANT: DETECTED [IU]/mL — AB
HCV Quantitative Log: <1.18 Log IU/mL — AB

## 2016-10-11 ENCOUNTER — Telehealth: Payer: Self-pay | Admitting: Gastroenterology

## 2016-10-12 NOTE — Telephone Encounter (Signed)
Please refer patient Nicole Reeves primary care to establish PMD.

## 2016-10-12 NOTE — Telephone Encounter (Signed)
Left information on her voicemail with the telephone numbers.

## 2016-11-09 ENCOUNTER — Ambulatory Visit: Payer: BLUE CROSS/BLUE SHIELD

## 2016-11-15 ENCOUNTER — Ambulatory Visit (INDEPENDENT_AMBULATORY_CARE_PROVIDER_SITE_OTHER): Payer: Managed Care, Other (non HMO) | Admitting: Pharmacist

## 2016-11-15 DIAGNOSIS — B182 Chronic viral hepatitis C: Secondary | ICD-10-CM | POA: Diagnosis not present

## 2016-11-15 NOTE — Progress Notes (Signed)
HPI: Nicole Reeves is a 31 y.o. female who presents to the [facility] pharmacy clinic for her end of therapy Hep C infection appointment. She has genotype 3, F0, and completed 12 weeks of Epclusa 2 weeks ago.  Lab Results  Component Value Date   HCVGENOTYPE 3 06/28/2016    Allergies: No Known Allergies  Past Medical History: Past Medical History:  Diagnosis Date  . Colitis 11/2014   acute onset bloody diarrhea. C diff, O&P negative.   . Hepatitis C 04/2016  . Left sided ulcerative colitis (HCC)   . Polysubstance abuse 02/24/2014   tox screen + for THC, Barbiturates, amphetimine.    Social History: Social History   Social History  . Marital status: Single    Spouse name: N/A  . Number of children: 0  . Years of education: N/A   Occupational History  . [employer]     Social History Main Topics  . Smoking status: Former Smoker    Packs/day: 0.25    Types: Cigarettes, E-cigarettes  . Smokeless tobacco: Never Used  . Alcohol use No  . Drug use: No     Comment: not used since 02/2014  . Sexual activity: Not Currently   Other Topics Concern  . Not on file   Social History Narrative  . No narrative on file    Labs: Hep B S Ab (no units)  Date Value  06/28/2016 INDETER (A)   Hepatitis B Surface Ag (no units)  Date Value  06/28/2016 NEGATIVE    Lab Results  Component Value Date   HCVGENOTYPE 3 06/28/2016    Hepatitis C RNA quantitative Latest Ref Rng & Units 08/31/2016  HCV Quantitative NOT DETECTED IU/mL <15 DETECTED(A)  HCV Quantitative Log NOT DETECTED Log IU/mL <1.18 DETECTED(A)    AST (U/L)  Date Value  06/28/2016 40 (H)  12/08/2014 21  12/07/2014 20   ALT (U/L)  Date Value  06/28/2016 64 (H)  06/28/2016 63 (H)  12/08/2014 37  12/07/2014 43   INR (no units)  Date Value  06/28/2016 0.9    CrCl: CrCl cannot be calculated (Patient's most recent lab result is older than the maximum 21 days allowed.).  Fibrosis Score: F0 as assessed by fibrosure    Child-Pugh Score: A  Previous Treatment Regimen: None  Assessment: Nicole Reeves is here today to follow-up with [place] for her Hep C infection.  She has completed 12 weeks of Epclusa with no issues.  Her early on treatment HCV viral load was already undetectable.  No side effects and no missed doses.  She did have some insomnia but does not attribute that to the medication.  I offered a flu shot today and she adamantly declined.  She also declined to start her Hep A and Hep B vaccine series.  I will check a HCV RNA today and follow-up with her in 3 months for her cure visit.   Plans: - Completed 12 weeks of Epclusa - End of therapy HCV RNA today - F/u with me again for cure visit 02/28/17 at 1130am  Nicole Reeves L. Kairee Isa, PharmD, CPP Infectious Diseases Clinical Pharmacist [facility] 11/15/2016, 11:48 AM

## 2016-11-19 LAB — HEPATITIS C RNA QUANTITATIVE
HCV QUANT LOG: NOT DETECTED {Log_IU}/mL
HCV RNA, PCR, QN: <15 IU/mL

## 2016-12-08 ENCOUNTER — Other Ambulatory Visit: Payer: Self-pay | Admitting: Gastroenterology

## 2016-12-10 ENCOUNTER — Encounter: Payer: Self-pay | Admitting: *Deleted

## 2017-01-08 ENCOUNTER — Other Ambulatory Visit: Payer: Self-pay | Admitting: Gastroenterology

## 2017-02-08 ENCOUNTER — Ambulatory Visit: Payer: Managed Care, Other (non HMO) | Admitting: Gastroenterology

## 2017-02-08 ENCOUNTER — Other Ambulatory Visit: Payer: Self-pay | Admitting: Gastroenterology

## 2017-02-28 ENCOUNTER — Ambulatory Visit: Payer: Managed Care, Other (non HMO)

## 2017-03-06 ENCOUNTER — Ambulatory Visit (INDEPENDENT_AMBULATORY_CARE_PROVIDER_SITE_OTHER): Payer: Managed Care, Other (non HMO) | Admitting: Pharmacist

## 2017-03-06 DIAGNOSIS — B182 Chronic viral hepatitis C: Secondary | ICD-10-CM

## 2017-03-06 NOTE — Progress Notes (Signed)
[facility] for Infect[facility] Disease Pharmacy Visit  HPI: Nicole Reeves is a 32 y.o. female who presents to the [facility] clinic today for Hep C follow-up.  She has genotype 3, F0, and completed 12 wks of Epclusa back in September.  Patient Active Problem List   Diagnosis Date Noted  . Chronic hepatitis C without hepatic coma ([place]) 06/28/2016  . Vaccine counseling 06/28/2016  . Diarrhea   . Blood in stool   . Hyponatremia 12/07/2014  . Acute colitis 12/07/2014      Medication List        Accurate as of 03/06/17  4:40 PM. Always use your most recent med list.          APRISO 0.375 g 24 hr capsule Generic drug:  mesalamine TAKE 4 CAPSULES (1.5 G TOTAL) BY MOUTH DAILY.   dicyclomine 10 MG capsule Commonly known as:  BENTYL Take 1 capsule (10 mg total) by mouth 3 (three) times daily as needed for spasms.   multivitamin with minerals Tabs tablet Take 1 tablet by mouth daily.       Allergies: No Known Allergies  Past Medical History: Past Medical History:  Diagnosis Date  . Colitis 11/2014   acute onset bloody diarrhea. C diff, O&P negative.   . Hepatitis C 04/2016  . Left sided ulcerative colitis ([place])   . Polysubstance abuse 02/24/2014   tox screen + for THC, Barbiturates, amphetimine.    Social History: Social History   Socioeconomic History  . Marital status: Single    Spouse name: Not on file  . Number of children: 0  . Years of education: Not on file  . Highest education level: Not on file  Social Needs  . Financial resource strain: Not on file  . Food insecurity - worry: Not on file  . Food insecurity - inability: Not on file  . Transportation needs - medical: Not on file  . Transportation needs - non-medical: Not on file  Occupational History  . Occupation: [employer]   Tobacco Use  . Smoking status: Former Smoker    Packs/day: 0.25    Types: Cigarettes, E-cigarettes  . Smokeless tobacco: Never Used  Substance and Sexual Activity  .  Alcohol use: No    Alcohol/week: 0.0 oz  . Drug use: No    Comment: not used since 02/2014  . Sexual activity: Not Currently  Other Topics Concern  . Not on file  Social History Narrative  . Not on file    Labs: Hep B S Ab (no units)  Date Value  06/28/2016 INDETER (A)   Hepatitis B Surface Ag (no units)  Date Value  06/28/2016 NEGATIVE    Lab Results  Component Value Date   HCVGENOTYPE 3 06/28/2016    Hepatitis C RNA quantitative Latest Ref Rng & Units 11/15/2016 08/31/2016  HCV Quantitative NOT DETECTED IU/mL - <15 DETECTED(A)  HCV Quantitative Log NOT DETECT Log IU/mL <1.18 NOT DETECTED <1.18 DETECTED(A)    AST (U/L)  Date Value  06/28/2016 40 (H)  12/08/2014 21  12/07/2014 20   ALT (U/L)  Date Value  06/28/2016 64 (H)  06/28/2016 63 (H)  12/08/2014 37  12/07/2014 43   INR (no units)  Date Value  06/28/2016 0.9    Fibrosis Score: F0 as assessed by fibrosure   Assessment: Nicole Reeves is here today for her SVR12 Hep C cure visit.  She completed 12 weeks of Epclusa back in September with no missed doses and no issues.  I  educated on the risk of reinfection after cure. Her last two Hep C viral loads on 7/6 and 9/20 were both undetectable.  I will get another HCV RNA today for cure and call her with the results.    Plan: - SVR12 Hep C RNA - Call with results  Nicole Reeves L. Erandy Mceachern, PharmD, [facility] 03/06/2017, 4:40 PM

## 2017-03-08 ENCOUNTER — Telehealth: Payer: Self-pay | Admitting: Pharmacist

## 2017-03-08 LAB — HEPATITIS C RNA QUANTITATIVE
HCV QUANT LOG: NOT DETECTED {Log_IU}/mL
HCV RNA, PCR, QN: <15 IU/mL

## 2017-03-08 NOTE — Telephone Encounter (Signed)
Called Nicole Reeves to let her know that her Hep C viral load was undetectable again so she is cured of her Hep C. Told her to let us know if she ever needs anything.

## 2017-03-09 ENCOUNTER — Other Ambulatory Visit: Payer: Self-pay | Admitting: Gastroenterology

## 2017-04-06 ENCOUNTER — Other Ambulatory Visit: Payer: Self-pay | Admitting: Gastroenterology

## 2017-05-06 ENCOUNTER — Other Ambulatory Visit: Payer: Self-pay | Admitting: Gastroenterology

## 2017-05-21 ENCOUNTER — Telehealth (HOSPITAL_COMMUNITY): Payer: Self-pay | Admitting: Professional

## 2017-05-27 ENCOUNTER — Other Ambulatory Visit (HOSPITAL_COMMUNITY): Payer: 59 | Attending: Psychiatry | Admitting: Licensed Clinical Social Worker

## 2017-05-27 DIAGNOSIS — F411 Generalized anxiety disorder: Secondary | ICD-10-CM

## 2017-05-27 DIAGNOSIS — Z811 Family history of alcohol abuse and dependence: Secondary | ICD-10-CM | POA: Insufficient documentation

## 2017-05-27 DIAGNOSIS — F22 Delusional disorders: Secondary | ICD-10-CM | POA: Insufficient documentation
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
## 2017-06-05 ENCOUNTER — Other Ambulatory Visit: Payer: Self-pay | Admitting: Gastroenterology

## 2017-06-11 ENCOUNTER — Ambulatory Visit (HOSPITAL_COMMUNITY): Payer: Self-pay | Admitting: Licensed Clinical Social Worker

## 2017-06-13 ENCOUNTER — Telehealth (HOSPITAL_COMMUNITY): Payer: Self-pay | Admitting: Licensed Clinical Social Worker

## 2017-06-14 ENCOUNTER — Encounter (HOSPITAL_COMMUNITY): Payer: Self-pay

## 2017-06-14 ENCOUNTER — Other Ambulatory Visit (HOSPITAL_COMMUNITY): Payer: 59 | Admitting: Licensed Clinical Social Worker

## 2017-06-14 VITALS — BP 104/72 | HR 65 | Ht 66.0 in | Wt 154.0 lb

## 2017-06-14 DIAGNOSIS — F411 Generalized anxiety disorder: Secondary | ICD-10-CM

## 2017-06-14 DIAGNOSIS — F332 Major depressive disorder, recurrent severe without psychotic features: Secondary | ICD-10-CM

## 2017-06-14 DIAGNOSIS — F22 Delusional disorders: Secondary | ICD-10-CM
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
## 2017-06-14 NOTE — Progress Notes (Signed)
Patient presents with restricted affect, depressed mood and reported starting PHP today after recently having repetitive thoughts to stab a pair of scissors in her neck.  Patient reported she became sober from past history of alcohol, heroin an pills about 3 years prior.  Reported at about one and a half years back she started having more and more problems with some depression and anxiety.  Reports this culminated to the point she began having obsessive thoughts about things she knew were not true, such as her partner cheating on her or that she was going to loose her job.  Patient denied any current suicidal or homicidal ideations, no auditory or visual hallucinations and no plan or intent to harm self or others at this time.  Reports she has had increased suicidal ideations over the past few weeks; however, and is hoping PHP and medication adjustments may help.  Patient reported some oversedation early in the mornings and discussed ways to take medications at night that may help with this.  Patient reported her current level of depression a 7, anxiety a 9 and hopelessness a 6 on a scale of 0-10 with 0 being none and 10 the worst she could manage.  Patient scored a 22 on her PHQ9 depression screening today and discussed some of the skills and therapies she would be exposed to in PHP.  Patient agreed to plan to maintain her safety while in the program and agreed to contact this nurse or PHP staff if any worsening of symptoms, side effects to medications or thoughts of wanting to harm self or others.  Patient reviewed all currently reported medications and agreed with plan for NP to verify dosages of medications before any potential medication changes as patient stated she was fine with staying on current medication regimen at this time.  Patient reported no plans to harm self or others and returned to [facility] to complete group this date.  Patient will also continue with AA as reports she attends several times a week.    Patient denied any substance use or abuse for more than over a year.

## 2017-06-17 ENCOUNTER — Other Ambulatory Visit (HOSPITAL_COMMUNITY): Payer: 59 | Admitting: Licensed Clinical Social Worker

## 2017-06-17 DIAGNOSIS — F411 Generalized anxiety disorder: Secondary | ICD-10-CM | POA: Diagnosis not present

## 2017-06-17 DIAGNOSIS — F332 Major depressive disorder, recurrent severe without psychotic features: Secondary | ICD-10-CM

## 2017-06-17 NOTE — Psych (Signed)
   [facility] PHP THERAPIST PROGRESS NOTE  Nicole MooresJacqueline Reeves [id number]  Session Time: 9:00 - 10:15  Participation Level: Active  Behavioral Response: CasualAlertDepressed  Type of Therapy: Group Therapy  Treatment Goals addressed: Coping  Interventions: CBT, DBT, Solution Focused, Supportive and Reframing  Summary: Clinician led check-in regarding current stressors and situation, and review of patient completed daily inventory. Clinician utilized active listening and empathetic response and validated patient emotions. Clinician facilitated processing group on pertinent issues.   Therapist Response: Nicole Reeves is a 32 y.o. female who presents with anxiety and depression symptoms. Patient arrived within time allowed and reports that she is feeling "nervous." Patient rates her mood at a 3 on a scale of 1-10 with 10 being great. Pt reports she cried a lot yesterday and spent time preparing her absence with HR. Patient will continue to work on managing anxious thoughts. Patient engaged in discussion.       Session Time: 10:15 -11:00  Participation Level: Active  Behavioral Response: CasualAlertDepressed  Type of Therapy: Group Therapy, psychoeducation, psychotherapy  Treatment Goals addressed: Coping  Interventions: CBT, DBT, Solution Focused, Supportive and Reframing  Summary:  Clinician introduced topic of goals and values. Group members completed worksheet "values and priorities list" and rated top priorities in their current life. Patients discussed if they were living in line with their values.    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      Session Time: 11:00 - 12:00  Participation Level: Active  Behavioral Response: CasualAlertDepressed  Type of Therapy: Group Therapy, Psychotherapy  Treatment Goals addressed: Coping  Interventions: CBT, Solution  focused, Supportive, Reframing  Summary:  Clinician continued topic of goals and values. Group worked to determine how to take a value and convert it into action steps.     Therapist Response: Patient engaged in group. Pt determined action step of continuing to come to group.         Session Time: 12:00- 1:00  Participation Level: Active  Behavioral Response: CasualAlertDepressed  Type of Therapy: Group Therapy, Psychoeducation; Psychotherapy  Treatment Goals addressed: Coping  Interventions: CBT; Solution focused; Supportive; Reframing  Summary: 12:00 - 12:50 Group watched "The Agony of Unsubscribing" TedTalk and discussed the topic of  Perspective and how it can change our view of reality. 12:50 -1:00 Clinician led check-out. Clinician assessed for immediate needs, medication compliance and efficacy, and safety concerns   Therapist Response: Patient engaged activity and discussion. Pt reports understanding of how perception affects reality.  At check-out, patient rates her mood at a 5 on a scale of 1-10 with 10 being great. Patient reports plans of spending time with her partner's daughter and going to a funeral this weekend.  Patient demonstrates some progress as evidenced by participating in first group session. Patient denies SI/HI/self-harm at the end of group.      Suicidal/Homicidal: Nowithout intent/plan   Plan: Pt will continue in PHP to continue addressing depression and anxiety symptoms and increased ability to manage these symptoms.   Diagnosis: Severe episode of recurrent major depressive disorder, without psychotic features (HCC) [F33.2]    1. Severe episode of recurrent major depressive disorder, without psychotic features (HCC)   2. Paranoia (HCC)   3. Generalized anxiety disorder       Nicole Reeves, KentuckyLCSW 06/17/2017

## 2017-06-18 ENCOUNTER — Encounter (HOSPITAL_COMMUNITY): Payer: Self-pay | Admitting: Occupational Therapy

## 2017-06-18 ENCOUNTER — Other Ambulatory Visit (HOSPITAL_COMMUNITY): Payer: 59 | Admitting: Occupational Therapy

## 2017-06-18 ENCOUNTER — Other Ambulatory Visit (HOSPITAL_COMMUNITY): Payer: 59 | Admitting: Licensed Clinical Social Worker

## 2017-06-18 DIAGNOSIS — F0789 Other personality and behavioral disorders due to known physiological condition: Secondary | ICD-10-CM

## 2017-06-18 DIAGNOSIS — F411 Generalized anxiety disorder: Secondary | ICD-10-CM | POA: Diagnosis not present

## 2017-06-18 DIAGNOSIS — R4589 Other symptoms and signs involving emotional state: Secondary | ICD-10-CM

## 2017-06-18 DIAGNOSIS — F332 Major depressive disorder, recurrent severe without psychotic features: Secondary | ICD-10-CM

## 2017-06-18 NOTE — Therapy (Signed)
[facility] PARTIAL HOSPITALIZATION PROGRAM [address] [place], [place], [address] Phone: [phone]   Fax:  [phone]  Occupational Therapy Evaluation & Treatment  Patient Details  Name: Nicole Reeves MRN: [id number] Date of Birth: 22-Mar-1985 Referring Provider: Hillery Jacks, NP   Encounter Date: 06/18/2017  OT End of Session - 06/18/17 1455    Visit Number  1    Number of Visits  6    Date for OT Re-Evaluation  07/12/17    Authorization Type  [employer]    OT Start Time  1030    OT Stop Time  1130    OT Time Calculation (min)  60 min    Activity Tolerance  Patient tolerated treatment well    Behavior During Therapy  [facility] for tasks assessed/performed       Past Medical History:  Diagnosis Date  . Colitis 11/2014   acute onset bloody diarrhea. C diff, O&P negative.   . Hepatitis C 04/2016  . Left sided ulcerative colitis (HCC)   . Polysubstance abuse (HCC) 02/24/2014   tox screen + for THC, Barbiturates, amphetimine.    Past Surgical History:  Procedure Laterality Date  . FLEXIBLE SIGMOIDOSCOPY N/A 12/10/2014   Procedure: FLEXIBLE SIGMOIDOSCOPY;  Surgeon: Napoleon Form, MD;  Location: [facility];  Service: Endoscopy;  Laterality: N/A;    There were no vitals filed for this visit.  Subjective Assessment - 06/18/17 1455    Currently in Pain?  No/denies        [facility] OT Assessment - 06/18/17 1455      Assessment   Medical Diagnosis  GAD    Referring Provider  Hillery Jacks, NP    Onset Date/Surgical Date  -- chronic      Precautions   Precautions  None      Balance Screen   Has the patient fallen in the past 6 months  No    Has the patient had a decrease in activity level because of a fear of falling?   No    Is the patient reluctant to leave their home because of a fear of falling?   No        OT assessment:  Diagnosis: GAD Past medical history: depression, anxiety  Living situation: with partner  ADLs:   Independent Work: Consulting civil engineer Leisure: fishing, working out Social support: family, partner Struggles: Work, Transitions  OT goal:  Haematologist Causality Orientation Scale    Subscore Percentile Score  Autonomy 65 87.82  Control 55 63.03  Impersonal 63 93.77    Motivation Type  Motivation type Explanation   Mixed The individual does not have a prominent motivational orientation.  Currently there is no clear theoretical explanation, and research shows this motivation type does not benefit from motivational interventions.     Assessment:  Patient demonstrates mixed motivation type.  Patient will benefit from occupational therapy intervention in order to improve time management, financial management, stress management, job readiness skills, social skills, sleep hygiene, exercise and healthy eating habits, and health management skills and other psychosocial skills needed for preparation to return to full time community living and to be a productive community member.  Patient was engaged and participated with group this date.  Plan:  Patient will participate in skilled occupational therapy sessions individually or in a group setting to improve coping skills, psychosocial skills, and emotional skills required to return to prior level of function as a productive community member.  Treatment will be 1-2 times per week for 2-6 weeks.             OT Treatment Session: Time Management  S: "I made a goal to walk my dog in the mornings."   O: Time management session completed with emphasis on skills required, effective versus ineffective time management, importance of structure, along with tips and strategies for success. Patient provided with education on the effects of time management in regards to improving physical, emotional, and social well-being including improved ability for focus, decision-making skills, success in work, and social commitments, as well as  benefits of reduced stress and the experience of greater success in all aspects of daily life.  A: Pt participated in time management occupational therapy treatment session this date. Pt participated in timed puzzle activity working in pairs with focus on time management and looking at the "big picture" in correlation to a daily/weekly/monthly schedule being the "big picture" of planning and managing one's time. Pt actively engaged in worksheets and discussion identifying time wasters in their daily schedule and activities that could be incorporated when time wasters are eliminated. Pt provided with weekly outline to write goals and make a plan each day to work towards goals. Pt provided with handout outlining discussed strategies for improved time management.   P: Pt provided with time management strategies to implement during her daily schedule to assist in improving her balance between self-care, work, leisure, and PHP program tasks. OT will follow up with pt on success or struggles with implementation of learned strategies next session.    OT Short Term Goals - 06/18/17 1456      OT SHORT TERM GOAL #1   Title  Patient will be educated on strategies to improve psychosocial skills needed to participate fully in all daily, work, and leisure activities.    Time  3    Period  Weeks    Status  New    Target Date  07/12/17      OT SHORT TERM GOAL #2   Title  Patient will be educated on a HEP and independent with implementation of HEP.    Time  3    Period  Weeks    Status  New      OT SHORT TERM GOAL #3   Title  Patient will independently apply psychosocial skills and coping mechanisms to her daily activities in order to function independently.    Time  3    Period  Weeks    Status  New               Plan - 06/18/17 1456    Occupational performance deficits (Please refer to evaluation for details):  ADL's;IADL's;Rest and Sleep;Work;Leisure;Social Participation    Rehab Potential   Good    OT Frequency  2x / week    OT Duration  -- 3 weeks    OT Treatment/Interventions  Self-care/ADL training;Psychosocial skills training;Coping strategies training;Patient/family education community reintegration    Clinical Decision Making  Limited treatment options, no task modification necessary    Consulted and Agree with Plan of Care  Patient       Patient will benefit from skilled therapeutic intervention in order to improve the following deficits and impairments:  Decreased coping skills, Decreased psychosocial skills, Other (comment)(decreased participation in ADLs)  Visit Diagnosis: Generalized anxiety disorder  Difficulty coping  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 Chronic hepatitis C without hepatic coma (HCC) 06/28/2016  . Vaccine counseling 06/28/2016  . Diarrhea   . Blood in stool   . Hyponatremia 12/07/2014  . Acute colitis 12/07/2014   Ezra SitesLeslie Harding Thomure, OTR/L  [phone] 06/18/2017, 2:57 PM  [facility] [address] [place], [place], [address] Phone: [phone]   Fax:  [phone]  Name: Marcello MooresJacqueline Amster MRN: [id number] Date of Birth: 04/13/1985

## 2017-06-19 ENCOUNTER — Other Ambulatory Visit (HOSPITAL_COMMUNITY): Payer: 59 | Admitting: Licensed Clinical Social Worker

## 2017-06-19 ENCOUNTER — Other Ambulatory Visit: Payer: Self-pay

## 2017-06-19 ENCOUNTER — Encounter (HOSPITAL_COMMUNITY): Payer: Self-pay | Admitting: Family

## 2017-06-19 VITALS — BP 121/81 | HR 76 | Temp 98.0°F | Resp 16

## 2017-06-19 DIAGNOSIS — F22 Delusional disorders: Secondary | ICD-10-CM

## 2017-06-19 DIAGNOSIS — F411 Generalized anxiety disorder: Secondary | ICD-10-CM | POA: Diagnosis not present

## 2017-06-19 DIAGNOSIS — F332 Major depressive disorder, recurrent severe without psychotic features: Secondary | ICD-10-CM

## 2017-06-19 MED ORDER — HYDROXYZINE HCL 25 MG PO TABS: 25.0000 mg | ORAL_TABLET | Freq: Three times a day (TID) | ORAL | 0 refills | Status: DC | PRN

## 2017-06-19 NOTE — Psych (Signed)
   [facility]  Nicole MooresJacqueline Liddicoat [id number]  Session Time: 9:00 - 10:45  Participation Level: Active  Behavioral Response: CasualAlertAnxious and Depressed  Type of Therapy: Group Therapy, Psychotherapy  Treatment Goals addressed: Coping  Interventions: CBT, DBT, Solution Focused, Supportive and Reframing  Summary: Clinician led check-in regarding current stressors and situation, and review of patient completed daily inventory. Clinician utilized active listening and empathetic response and validated patient emotions. Clinician facilitated processing group on pertinent issues.   Therapist Response: Nicole MooresJacqueline Mendia is a 32 y.o. female who presents with anxiety and depression symptoms. Patient arrived within time allowed and reports she is feeling "tired. I did not sleep well." Pt rates her mood at a 3 on a scale of 1-10 with 10 being great. Pt reports that she had a "rough" night. Pt shared that she talked to her mother and said, "my mother is still struggling to understand my mental illness." Pt reports she is still working on not isolating herself from her supports. Pt engaged in discussion.      Session Time: 10:45 -12:15  Participation Level: Active  Behavioral Response: CasualAlertDepressed  Type of Therapy: Group Therapy, psychotherapy  Treatment Goals addressed: Coping  Interventions: Strengths based, reframing, Supportive,   Summary:  Spiritual Care group  Therapist Response: Patient engaged in group. See chaplain note.          Session Time: 12:15 - 1:00  Participation Level: Active  Behavioral Response: CasualAlertDepressed  Type of Therapy: Group Therapy, Activity Therapy  Treatment Goals addressed: Coping  Interventions: Psychologist, occupationalocial Skills Training, Supportive  Summary:  Reflection Group: Patients encouraged to practice skills and interpersonal techniques or work on mindfulness and relaxation techniques. The importance of  self-care and making skills part of a routine to increase usage were stressed   Therapist Response: Patient engaged and participated appropriately.    1:00 Pt chose to leave group during this time, saying she was "exhausted" and wanted to go home and take a nap. Pt reported no SI/HI/self-harm before she left.      Suicidal/Homicidal: Nowithout intent/plan   Plan: Pt will continue in PHP to continue addressing depression and anxiety symptoms and increased ability to manage these symptoms.   Diagnosis: Generalized anxiety disorder [F41.1]    1. Generalized anxiety disorder   2. Paranoid ideation (HCC)   3. Severe episode of recurrent major depressive disorder, without psychotic features (HCC)       Donia GuilesJenny Kianni Lheureux, LCSW 06/19/2017

## 2017-06-19 NOTE — Psych (Signed)
[facility]  Nicole MooresJacqueline Reeves [id number]  Session Time: 9:00 - 10:30  Participation Level: Active  Behavioral Response: CasualAlertAnxious and Depressed  Type of Therapy: Group Therapy, Psychotherapy  Treatment Goals addressed: Coping  Interventions: CBT, DBT, Solution Focused, Supportive and Reframing  Summary: Clinician led check-in regarding current stressors and situation, and review of patient completed daily inventory. Clinician utilized active listening and empathetic response and validated patient emotions. Clinician facilitated processing group on pertinent issues.   Therapist Response: Nicole Reeves is a 32 y.o. female who presents with anxiety and depression symptoms. Patient arrived within time allowed and reports that she is feeling "anxious." Patient rates her mood at a 5 on a scale of 1-10 with 10 being great. Pt reports she "Is ready to get my meds under control.." Pt reports she had an "OK" evening and a "good morning."  Pt shares she used techniques to manage her racing thoughts last night, specifically distraction.  Pt shares she tried to use imagery "but that didn't work for me." Patient reports she continues to work on being open with others. Patient engaged in discussion.    Session Time: 10:30 -11:30   Participation Level: Active   Behavioral Response: CasualAlertAnxiousandDepressed   Type of Therapy: Group Therapy, OT   Treatment Goals addressed: Coping   Interventions: Psychosocial skills training, Supportive,    Summary:  Occupational Therapy group   Therapist Response: Patient engaged in group. See OT note.             Session Time: 11:30 - 12:15   Participation Level: Active   Behavioral Response: CasualAlertAnxiousandDepressed   Type of Therapy: Group Therapy, Psychotherapy, Psychoeducation   Treatment Goals addressed: Coping   Interventions: CBT, Solution focused, Supportive, Reframing   Summary:   Clinician introduced topic of healthy relationships.  Group discussed healthy/unhealthy traits for relationships. Group discussed the 3 key components of a healthy relationship: trust, respect, honesty; and the 2 "silent" extras: communication and boundaries.    Therapist Response: Pt engaged in activity and discussion. Pt reports she struggles with honesty because it opens her up to vulnerability, which then affects the trust and respect.  Pt reports she wants to continue to work on and develop healthy relationships with those she cares about.        Session Time: 12:15 - 1:00   Participation Level: Active   Behavioral Response: CasualAlertAnxiousandDepressed   Type of Therapy: Group Therapy, Activity Therapy   Treatment Goals addressed: Coping   Interventions: Psychologist, occupationalocial Skills Training, Supportive   Summary:  Reflection Group: Patients encouraged to practice skills and interpersonal techniques or work on mindfulness and relaxation techniques. The importance of self-care and making skills part of a routine to increase usage were stressed    Therapist Response: Patient engaged and participated appropriately.            Session Time: 1:00- 2:00   Participation Level: Active   Behavioral Response: CasualAlertAnxiousandDepressed   Type of Therapy: Group Therapy, Psychoeducation; Psychotherapy   Treatment Goals addressed: Coping   Interventions: CBT; Solution focused; Supportive; Reframing   Summary: 1:00 - 1:50: Clinician continued with relationship topic.  Patients took Cablevision SystemsLove Languages assessment and discussed how love languages can affect relationships.  Clinician continued with topic of communication within relationships. Clinician led "back to back communication" exercise to introduce problems with perception and communication and how that affects relationships. Pt's sat back to back from a partner and attempted to have their partner draw a picture  only the patient can see. 1:50  -2:00 Clinician led check-out. Clinician assessed for immediate needs, medication compliance and efficacy, and safety concerns     Therapist Response: Pt engaged in activity and discussion. Pt reports her main love language is "quality time" and believes her partner's is "physical touch" which she scored a "1" on.  Pt reports she is working on communicating love to her partner with physical touch even though it is hard for her.  Pt reports the back-to-back activity was "frustrating and eye-opening."  Pt reports it made her realize how hard it can be to communicate with someone that is shut-down. At check-out, patient rates her mood at a 6 on a scale of 1-10 with 10 being great.  Pt reports she is going to walk her dog, spend time with her partner, and try to cook dinner this evening.  Pt demonstrates some progress as evidenced by increased participation and openness in group.  Pt denies SI/HI/self-harm at the end of group.   Suicidal/Homicidal: Nowithout intent/plan   Plan: Pt will continue in PHP to continue addressing depression and anxiety symptoms and increased ability to manage these symptoms.   Diagnosis: Severe episode of recurrent major depressive disorder, without psychotic features (HCC) [F33.2]    1. Severe episode of recurrent major depressive disorder, without psychotic features (HCC)   2. Generalized anxiety disorder       Quinn Axe, LPCA 06/19/2017

## 2017-06-19 NOTE — Progress Notes (Signed)
BH MD/PA/NP PHP Progress Note  06/19/2017 9:50 AM Nicole Reeves  MRN:  [id number]    [name] seen sitting in group dayroom. Continues to present flat guarded.  Reports ongoing symptoms of racing obsessive thoughts.  Continues to report feelings as if her partner is cheating however it is insightful to state she notes that these thoughts are just in her head.  Patient reports feeling" restlessness and jitteriness" inside her entire body.  Patient continues to express concern with her current medication combination.  NP spoke to patient's primary attending psychiatrist Dr. Owens Shark on 4/23.  MD reports patient has been on multiple medications in the past for her reported symptoms.  States patient has not tried medications long enough to provide optimal effect.  Reports patient was recently started on this medication combination Geodon 60 mg , Cymbalta 60 mg , Trileptal 900, Gabapentin 300 TID.  Patient was advised to continue current medications as prescribed.  Lilu then contacted psychiatrist on 4/23 will discontinue the Geodon and initiated Abilify. She reports she was hopeful to get a second opinion with medications.  Patient has a follow-up appointment with Dr. Daron Offer 5/23. Patient's case was discussed in treatment team.  NP provided orders for hydroxyzine 25 mg  p.o. 3 times daily as needed for anxiety. Reports encouragement and reassurance was provided.   History: Per assessment note-Pt presents as self-referral due to increased anxiety that is interfering with her daily functioning. 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 Pt is able to recognize the irrationality of these ruminations and argue them with ration. Pt shares difficulty with managing the feeling even though she is aware of the logic.  Pt shares history of depression and anxiety in varying levels. Pt is 3 years sober from polysubstances,  most notably heroin and alcohol. Pt reports she is active in [place] and has a sponsor and sponsees who are a support for her. Pt reports when triggered with struggles recently it is not using that she thinks about, but the idea that "it would be easier if I wasn't here." Pt denies ever having plan or intent to harm herself.   Patients Currently Reported Symptoms/Problems: Pt reports ruminations with a catastrophic leaning; feeling "down all of a sudden," constant worry, irrtiability. racing thoughts, and poor concentration.   Visit Diagnosis:    ICD-10-CM   1. Generalized anxiety disorder F41.1   2. Paranoid ideation ([place]) F22     Past Psychiatric History:  GAD and MDD   Past Medical History:  Past Medical History:  Diagnosis Date  . Colitis 11/2014   acute onset bloody diarrhea. C diff, O&P negative.   . Hepatitis C 04/2016  . Left sided ulcerative colitis ([place])   . Polysubstance abuse ([place]) 02/24/2014   tox screen + for THC, Barbiturates, amphetimine.    Past Surgical History:  Procedure Laterality Date  . FLEXIBLE SIGMOIDOSCOPY N/A 12/10/2014   Procedure: FLEXIBLE SIGMOIDOSCOPY;  Surgeon: Mauri Pole, MD;  Location: [facility];  Service: Endoscopy;  Laterality: N/A;    Family Psychiatric History:   Family History:  Family History  Problem Relation Age of Onset  . ADD / ADHD Sister   . Anxiety disorder Sister   . Drug abuse Paternal Aunt   . Alcohol abuse Maternal Grandfather   . Alcohol abuse Maternal Grandmother   . Alcohol abuse Paternal Grandfather   . Alcohol abuse Paternal Grandmother   . Anxiety disorder Paternal  Grandmother   . Drug abuse Paternal Grandmother   . Colon cancer Neg Hx   . Esophageal cancer Neg Hx   . Rectal cancer Neg Hx   . Stomach cancer Neg Hx     Social History:  Social History   Socioeconomic History  . Marital status: Single    Spouse name: Not on file  . Number of children: 0  . Years of education: Not on file  . Highest  education level: Not on file  Occupational History  . Occupation: [employer]   Social Needs  . Financial resource strain: Not hard at all  . Food insecurity:    Worry: Never true    Inability: Never true  . Transportation needs:    Medical: No    Non-medical: No  Tobacco Use  . Smoking status: Former Smoker    Packs/day: 0.25    Types: Cigarettes, E-cigarettes  . Smokeless tobacco: Never Used  . Tobacco comment: Reports desire to quite e-cigarettes   Substance and Sexual Activity  . Alcohol use: No    Alcohol/week: 0.0 oz  . Drug use: No    Types: Marijuana    Comment: not used since 02/2014  . Sexual activity: Yes    Partners: Female  Lifestyle  . Physical activity:    Days per week: 0 days    Minutes per session: Not on file  . Stress: To some extent  Relationships  . Social connections:    Talks on phone: More than three times a week    Gets together: Never    Attends religious service: More than 4 times per year    Active member of club or organization: No    Attends meetings of clubs or organizations: Never    Relationship status: Living with partner  Other Topics Concern  . Not on file  Social History Narrative  . Not on file    Allergies: No Known Allergies  Metabolic Disorder Labs: Lab Results  Component Value Date   HGBA1C 5.1 12/08/2014   MPG 100 12/08/2014   No results found for: PROLACTIN No results found for: CHOL, TRIG, HDL, CHOLHDL, VLDL, LDLCALC Lab Results  Component Value Date   TSH 1.264 03/22/2015   TSH 4.967 (H) 12/08/2014    Therapeutic Level Labs: No results found for: LITHIUM No results found for: VALPROATE No components found for:  CBMZ  Current Medications: Current Outpatient Medications  Medication Sig Dispense Refill  . APRISO 0.375 g 24 hr capsule TAKE 4 CAPSULES (1.5 G TOTAL) BY MOUTH DAILY. 120 capsule 0  . DULoxetine (CYMBALTA) 30 MG capsule Take 30 mg by mouth daily.  1  . gabapentin (NEURONTIN) 300 MG capsule Take 300 mg  by mouth daily. Takes 1 in the morning, 2 in the afternoon, and 2 at night.    . hydrOXYzine (ATARAX/VISTARIL) 25 MG tablet Take 1 tablet (25 mg total) by mouth 3 (three) times daily as needed for anxiety. 90 tablet 0  . Multiple Vitamin (MULTIVITAMIN WITH MINERALS) TABS tablet Take 1 tablet by mouth daily. 30 tablet 0  . oxcarbazepine (TRILEPTAL) 600 MG tablet Take 900 mg by mouth at bedtime.  5  . ziprasidone (GEODON) 60 MG capsule Take 60 mg by mouth 2 (two) times daily.  1  . zonisamide (ZONEGRAN) 50 MG capsule Take 400 mg by mouth at bedtime.  3   No current facility-administered medications for this visit.      Musculoskeletal: Strength & Muscle Tone: within normal limits  Gait & Station: normal Patient leans: N/A  Psychiatric Specialty Exam: ROS  There were no vitals taken for this visit.There is no height or weight on file to calculate BMI.  General Appearance: Casual  Eye Contact:  Good  Speech:  Clear and Coherent  Volume:  Normal  Mood:  Anxious and Depressed  Affect:  Congruent  Thought Process:  Coherent  Orientation:  Full (Time, Place, and Person)  Thought Content: Logical, Paranoid Ideation and Rumination   Suicidal Thoughts:  No  Homicidal Thoughts:  No  Memory:  Immediate;   Fair Recent;   Fair Remote;   Fair  Judgement:  Fair  Insight:  Fair  Psychomotor Activity:  Normal  Concentration:  Concentration: Fair  Recall:  [employer] of Knowledge: Fair  Language: Fair  Akathisia:  No  Handed:  Right  AIMS (if indicated):  Assets:  Communication Skills Desire for Improvement Social Support  ADL's:  Intact  Cognition: WNL  Sleep:  Fair   Screenings: GAD-7     Counselor from 06/17/2017 in [place]  Total GAD-7 Score  19    PHQ2-9     Counselor from 06/14/2017 in [place] Most recent reading at 06/14/2017 11:35 AM Counselor from 06/17/2017 in [place] Most recent reading at 06/14/2017  9:00 AM [facility] Most recent reading at 06/28/2016  2:30 PM  PHQ-2 Total Score  6  6  2   PHQ-9 Total Score  22  24  6        Assessment and Plan:  - Continue PHP - Keep f/u with MD [place] - Continue medications as prescribed  -  initiated hydroxyzine 25 mg p.o. as needed for anxiety  Treatment plan was reviewed and discussed by NP. T.Leiwis and patient Rhiana Morash need for continued group session      Derrill [place], NP 06/19/2017, 9:50 AM

## 2017-06-19 NOTE — Psych (Signed)
[facility]  Marcello MooresJacqueline Kenna [id number]  Session Time: 9:00 - 10:30  Participation Level: Active  Behavioral Response: CasualAlertDepressed  Type of Therapy: Group Therapy  Treatment Goals addressed: Coping  Interventions: CBT, DBT, Solution Focused, Supportive and Reframing  Summary: Clinician led check-in regarding current stressors and situation, and review of patient completed daily inventory. Clinician utilized active listening and empathetic response and validated patient emotions. Clinician facilitated processing group on pertinent issues.   Therapist Response: Marcello MooresJacqueline Cothron is a 32 y.o. female who presents with anxiety and depression symptoms. Patient arrived within time allowed and reports that she is feeling "bad." Patient rates her mood at a 2 on a scale of 1-10 with 10 being great. Pt reports she had a "bad morning and a bad night." Pt reports she had a fight with her partner, who shared with her that she does not trust pt. This hurt pt greatly. Pt states she also feels her mental health is being used against her and she is not being allowed her normal feelings. Pt reports prior to these events, her weekend was okay and she spent time with her partner's niece and watched a show on Netflix. Patient reports needing to continue work on letting her armor down. Patient engaged in discussion.       Session Time: 10:30 -11:30  Participation Level: Active  Behavioral Response: CasualAlertDepressed  Type of Therapy: Group Therapy, psychoeducation, psychotherapy  Treatment Goals addressed: Coping  Interventions: CBT, DBT, Solution Focused, Supportive and Reframing  Summary:  Clinician introduced topic of feelings and emotions. Group viewed feeling faces handout and identified which three emotions they feel right now and which they want to feel. Clinician discussed the way to contextualize feelings as things that come and go and the ability to  choose which feelings we attach to. Cln used metaphor of leaves in the wind.    Therapist Response: Patient participated and shared the three feelings she wants to feel more of are: "thoughtful, peaceful, confident." Pt reports understanding of feelings and their purpose.     Session Time: 11:30 - 12:15   Participation Level: Active   Behavioral Response: CasualAlertDepressed   Type of Therapy: Group Therapy, Psychotherapy   Treatment Goals addressed: Coping   Interventions: CBT, Solution focused, Supportive, Reframing   Summary: Cln continued topic of feelings. Cln provided education on the difference between feelings and reactions to feelings, highlighting that our reactions we can alter.    Therapist Response: Patient participated and reports understanding of the role of feelings and the differences between feelings and our reactions to feelings.        Session Time: 12:15 - 1:00  Participation Level: Active  Behavioral Response: CasualAlertDepressed  Type of Therapy: Group Therapy, Activity Therapy  Treatment Goals addressed: Coping  Interventions: Psychologist, occupationalocial Skills Training, Supportive  Summary:  Reflection Group: Patients encouraged to practice skills and interpersonal techniques or work on mindfulness and relaxation techniques. The importance of self-care and making skills part of a routine to increase usage were stressed   Therapist Response: Patient engaged and participated appropriately.       Session Time: 12:45- 2:00  Participation Level: Active  Behavioral Response: CasualAlertDepressed  Type of Therapy: Group Therapy, Psychoeducation; Psychotherapy  Treatment Goals addressed: Coping  Interventions: CBT; Solution focused; Supportive; Reframing  Summary: 12:45 - 1:50: Clinician continued topic of feelings. Cln discussed 3 states of mind: emotion mind, reason mind, and wise mind. Pt's shared examples of when they were in each  state of mind. Group  played feelings Jenga and group members worked to access specificity with feeling recognition. 1:50 -2:00 Clinician led check-out. Clinician assessed for immediate needs, medication compliance and efficacy, and safety concerns   Therapist Response: Patient engaged in activity and discussion. Pt reports understanding of three states of mind. Pt is able to recognize and define a spectrum of feeling words.  At check-out, patient rates her mood at a 4 on a scale of 1-10 with 10 being great. Patient reports plans of having alone time.  Patient demonstrates some progress as evidenced by increased sharing during processing. Patient denies SI/HI/self-harm at the end of group.     Suicidal/Homicidal: Nowithout intent/plan   Plan: Pt will continue in PHP to continue addressing depression and anxiety symptoms and increased ability to manage these symptoms.   Diagnosis: Severe episode of recurrent major depressive disorder, without psychotic features (HCC) [F33.2]    1. Severe episode of recurrent major depressive disorder, without psychotic features (HCC)   2. Generalized anxiety disorder       [name], [place] 06/19/2017

## 2017-06-19 NOTE — Progress Notes (Signed)
"  Nicole Reeves" presents guarded and subdued/flat. Does engage in conversation and responds to questions appropriately. She appears to be open to recommendations. No acute distress noted. States she is feeling a little shaky today, but states she did not sleep well last night. When asked why she feels she didn't sleep well, she attributes that to medication changes made yesterday (switched from Geodon to Abilify by outside psychiatrist, see medication list update). States she has concerns about her medications as they pertain to her level of need. States she thinks her Psychiatrist will give her just about anything she asks for and she isnt sure that's the level of care she needs. When asked about her goal, she replied: "I just want to feel better about myself on the inside. I have a good job and a good partner, on the outside I look great. On the inside I feel broken and I feel like I shouldn't have a problem.". She expressed anxiety/perseveration related to her job, relationship with her partner and puppy at home (she is currently crate training the puppy). She is quick to point to the reality of the situation which is she is no real danger of losing her job, she has no signs that her partner is cheating on her or planning to leave her and her puppy is probably just fine. Discussed prn medication, Hydroxyzine, to help mitigate her anxiety. States she had tried that in the past but was honest about not taking it very long. She is open to trying it again. She currently rates her Anxiety a level 5 on a scale of 1-10 (1-no Anxiety and 10- most anxiety). She rates her Depression a 5 on a scale of 1-10 (1-not Depressed and 10-Most Depressed). She states her appetite is poor. Reports rarely eating a substantial breakfast or dinner, but does have a big appetite in the afternoon. Encouraged her to try to eat something throughout the day based on the medication she takes, even if its something simple like yogurt, cheese or a  breakfast/granola bar. Support and encouragement provided. She denies SI/HI/AVH. She denies current abuse against her and she denies abusing anyone. She denies substance abuse currently. Reviewed indications, dose, frequency and potential side effects of Hydroxyzine. She verbalized understanding. She offered no additional questions or concerns. She was escorted back to group.

## 2017-06-20 ENCOUNTER — Other Ambulatory Visit (HOSPITAL_COMMUNITY): Payer: 59

## 2017-06-20 ENCOUNTER — Other Ambulatory Visit (HOSPITAL_COMMUNITY): Payer: 59 | Admitting: Licensed Clinical Social Worker

## 2017-06-20 DIAGNOSIS — F332 Major depressive disorder, recurrent severe without psychotic features: Secondary | ICD-10-CM

## 2017-06-20 DIAGNOSIS — F22 Delusional disorders: Secondary | ICD-10-CM

## 2017-06-20 DIAGNOSIS — F411 Generalized anxiety disorder: Secondary | ICD-10-CM

## 2017-06-21 ENCOUNTER — Other Ambulatory Visit (HOSPITAL_COMMUNITY): Payer: Self-pay

## 2017-06-21 NOTE — Psych (Signed)
[facility]  Nicole MooresJacqueline Reeves [id number]  Session Time: 9:00 - 10:30  Participation Level: Active  Behavioral Response: CasualAlertAnxious and Depressed  Type of Therapy: Group Therapy, Psychotherapy  Treatment Goals addressed: Coping  Interventions: CBT, DBT, Solution Focused, Supportive and Reframing  Summary: Clinician led check-in regarding current stressors and situation, and review of patient completed daily inventory. Clinician utilized active listening and empathetic response and validated patient emotions. Clinician facilitated processing group on pertinent issues.   Therapist Response: Nicole MooresJacqueline Reeves is a 32 y.o. female who presents with anxiety and depression symptoms, and possible paranoia. Pt arrived within time allowed and reports she is feeling "pretty good." Pt rates her mood at a 6 on a scale of 1-10 with 10 being great. Pt reports that she had an "alright" evening. Pt shared she tried taking a nap but could not fall asleep. Pt stated she went to committee meeting last night for AA. Pt reports that she is still working on distorted thoughts she has about herself. Pt engaged in discussion.     Session Time: 10:30 -12:00   Participation Level: Active   Behavioral Response: CasualAlertAnxiousandDepressed   Type of Therapy: Group Therapy, OT   Treatment Goals addressed: Coping   Interventions: Psychosocial skills training, Supportive   Summary:  OT Group. Cln led group on life after group concerning careers, schooling, and volunteering. Cln had patients complete a "bucket filler" where pts had to write out positive affirmations about each group member or individuals outside of group and then hand them out.   Therapist Response: Pt engaged in activity and discussion. Pt reports that she wants to work on finding a new job that is less stressful. Pt shared that she is going to start this process soon after she leaves group.        Session Time: 12:00 - 12:45   Participation Level: Active   Behavioral Response: CasualAlertAnxiousandDepressed   Type of Therapy: Group Therapy, Activity Therapy   Treatment Goals addressed: Coping   Interventions: Psychologist, occupationalocial Skills Training, Supportive   Summary:  Reflection Group: Patients encouraged to practice skills and interpersonal techniques or work on mindfulness and relaxation techniques. The importance of self-care and making skills part of a routine to increase usage were stressed    Therapist Response: Patient engaged and participated appropriately.            Session Time: 12:45- 2:00   Participation Level: Active   Behavioral Response: CasualAlertAnxiousandDepressed   Type of Therapy: Group Therapy, Psychoeducation; Psychotherapy, Activity Therapy   Treatment Goals addressed: Coping   Interventions: CBT; Solution focused; Supportive; Reframing   Summary: 1:00 - 1:50: 12:45 - 1:50 Cln introduced the topic of mindfulness. Cln went over the "What" and "How" mindfulness worksheets. Pts reviewed and practice how to do mindfulness meditation and body scan with cln.  1:50 -2:00 Clinician led check-out. Clinician assessed for immediate needs, medication compliance and efficacy, and safety concerns   Therapist Response: Pt engaged in activity and discussion. Pt reports her main love language is "quality time" and believes her partner's is "physical touch" which she scored a "1" on.  Pt reports she is working on communicating love to her partner with physical touch even though it is hard for her.  Pt reports the back-to-back activity was "frustrating and eye-opening."  Pt reports it made her realize how hard it can be to communicate with someone that is shut-down. At check-out, Pt rates her mood at a 6 on a scale  of 1-10 with 10 being great at the end of group. Pt reported that she is going to run some errands for her camping trip this weekend and then take a nap before she  has to go to her AA meeting tonight. Pt demonstrates some progress as evidenced by increasingly acknowledging skills that work best for her when she is feeling anxious and increase social interaction outside of group. Pt denies Si/HI/self-harm at the end of group.   Suicidal/Homicidal: Nowithout intent/plan   Plan: Pt will continue in PHP to continue addressing depression and anxiety symptoms and increased ability to manage these symptoms.   Diagnosis: Severe episode of recurrent major depressive disorder, without psychotic features (HCC) [F33.2]    1. Severe episode of recurrent major depressive disorder, without psychotic features (HCC)   2. Paranoid ideation (HCC)   3. Generalized anxiety disorder       Quinn Axe, LPCA, LCASA 06/21/2017

## 2017-06-24 ENCOUNTER — Other Ambulatory Visit (HOSPITAL_COMMUNITY): Payer: 59 | Admitting: Licensed Clinical Social Worker

## 2017-06-24 ENCOUNTER — Other Ambulatory Visit (HOSPITAL_COMMUNITY): Payer: 59 | Admitting: Occupational Therapy

## 2017-06-24 ENCOUNTER — Encounter (HOSPITAL_COMMUNITY): Payer: Self-pay | Admitting: Occupational Therapy

## 2017-06-24 DIAGNOSIS — F0789 Other personality and behavioral disorders due to known physiological condition: Secondary | ICD-10-CM

## 2017-06-24 DIAGNOSIS — R4589 Other symptoms and signs involving emotional state: Secondary | ICD-10-CM

## 2017-06-24 DIAGNOSIS — F411 Generalized anxiety disorder: Secondary | ICD-10-CM

## 2017-06-24 DIAGNOSIS — F332 Major depressive disorder, recurrent severe without psychotic features: Secondary | ICD-10-CM

## 2017-06-24 DIAGNOSIS — F22 Delusional disorders: Secondary | ICD-10-CM

## 2017-06-24 NOTE — Therapy (Signed)
[facility] PARTIAL HOSPITALIZATION PROGRAM [address] [place], [place], [address] Phone: [phone]   Fax:  [phone]  Occupational Therapy Treatment  Patient Details  Name: Nicole Reeves MRN: [id number] Date of Birth: Apr 08, 1985 Referring Provider: Hillery Jacks, NP   Encounter Date: 06/24/2017  OT End of Session - 06/24/17 1502    Visit Number  2    Number of Visits  6    Date for OT Re-Evaluation  07/12/17    Authorization Type  [employer]    OT Start Time  1115    OT Stop Time  1230    OT Time Calculation (min)  75 min    Activity Tolerance  Patient tolerated treatment well    Behavior During Therapy  [facility] for tasks assessed/performed       Past Medical History:  Diagnosis Date  . Colitis 11/2014   acute onset bloody diarrhea. C diff, O&P negative.   . Hepatitis C 04/2016  . Left sided ulcerative colitis (HCC)   . Polysubstance abuse (HCC) 02/24/2014   tox screen + for THC, Barbiturates, amphetimine.    Past Surgical History:  Procedure Laterality Date  . FLEXIBLE SIGMOIDOSCOPY N/A 12/10/2014   Procedure: FLEXIBLE SIGMOIDOSCOPY;  Surgeon: Napoleon Form, MD;  Location: [facility];  Service: Endoscopy;  Laterality: N/A;    There were no vitals filed for this visit.  Subjective Assessment - 06/24/17 1501    Currently in Pain?  No/denies    Pain Score  0-No pain         S: "I have a hard time confronting friends with issues that need to be talked about". O: Communication skills group completed with emphasis on control, influence and acceptance in regards to social scenarios. Team building obstacle course completed to improve communication and listening skills, and to build trust between partners. The control, influence, acceptance (CIA) framework was provided as a communication skill to help address uncomfortable or "elephant in the room" social situations. Pt was to write out 2-3 uncomfortable social situations to analyze  within group. Education and handout given to address importance of recognizing aspects of control and areas not possible to control in relation to daily life. This exercise offered an opportunity for self-reflection in order to relieve stress and uncertainty when using appropriate communication skills/strategies. A: Pt actively engaged in Insurance underwriter worksheets this date. Pt identified 2 uncomfortable social situations with focus on family and friend relationships and how to handle confrontation. Pt was able to accurately identify areas of control vs. areas not within personal control after presented with CIA framework. Pt actively engaged in self-control based handouts to further identify aspects of control in daily life.  Pt initiated conversation to offer example within group this date. P: Pt provided with communication skills in the area of the CIA framework to implement into her daily social situations to offer self-improvement and improve relationships. OT will continue follow up with communication skills for successful implementation in daily life.                   OT Education - 06/24/17 1501    Education provided  Yes    Education Details  communication skills with emphasis on self control in uncomfortable social situations    Person(s) Educated  Patient    Methods  Explanation;Demonstration;Handout    Comprehension  Verbalized understanding;Returned demonstration       OT Short Term Goals - 06/18/17 1456      OT  SHORT TERM GOAL #1   Title  Patient will be educated on strategies to improve psychosocial skills needed to participate fully in all daily, work, and leisure activities.    Time  3    Period  Weeks    Status  New    Target Date  07/12/17      OT SHORT TERM GOAL #2   Title  Patient will be educated on a HEP and independent with implementation of HEP.    Time  3    Period  Weeks    Status  New      OT SHORT TERM GOAL #3   Title  Patient will  independently apply psychosocial skills and coping mechanisms to her daily activities in order to function independently.    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 Chronic hepatitis C without hepatic coma (HCC) 06/28/2016  . Vaccine counseling 06/28/2016  . Diarrhea   . Blood in stool   . Hyponatremia 12/07/2014  . Acute colitis 12/07/2014   Dalphine Handing, MSOT, OTR/L  [place] 06/24/2017, 3:05 PM  [facility] PARTIAL HOSPITALIZATION PROGRAM [address] [place], [place], [address] Phone: [phone]   Fax:  [phone]  Name: Nicole Reeves MRN: [id number] Date of Birth: 1985-07-31

## 2017-06-25 ENCOUNTER — Other Ambulatory Visit (HOSPITAL_COMMUNITY): Payer: 59 | Admitting: Family

## 2017-06-25 ENCOUNTER — Encounter (HOSPITAL_COMMUNITY): Payer: Self-pay | Admitting: Occupational Therapy

## 2017-06-25 ENCOUNTER — Other Ambulatory Visit (HOSPITAL_COMMUNITY): Payer: 59 | Admitting: Licensed Clinical Social Worker

## 2017-06-25 DIAGNOSIS — F411 Generalized anxiety disorder: Secondary | ICD-10-CM

## 2017-06-25 DIAGNOSIS — F332 Major depressive disorder, recurrent severe without psychotic features: Secondary | ICD-10-CM

## 2017-06-25 DIAGNOSIS — R4589 Other symptoms and signs involving emotional state: Secondary | ICD-10-CM

## 2017-06-25 DIAGNOSIS — F0789 Other personality and behavioral disorders due to known physiological condition: Secondary | ICD-10-CM

## 2017-06-25 DIAGNOSIS — F22 Delusional disorders: Secondary | ICD-10-CM

## 2017-06-25 NOTE — Progress Notes (Signed)
[facility] MD/PA/NP PHP  Progress Note  06/25/2017 12:37 PM Nicole Reeves  MRN:  [id number]  Chief Complaint:  Patient continues to present with concerns with her current medication regimen. Reports she is followed by [facility] psychiatrist. Reports recent changes to her Geodon 60 mg PO BID and Cymbalta 60 mg. Reports she was advised to discontinue Geodon and Cymbalta 60 mg start Abilify 5 mg and Celexa 10 mg. Patient continues to reports she is feeling "foggy headed." with excessive medications.   NP consulted with MD and will start titration of medications for long term stabilization.  (see treatment plan below)  Patient reports she is feeling uneasy with her current psychiatrist treatment goal.  Nicole Reeves reports ongoing frustration and reluctance  with medication. Patient continues to report "obsessive thoughts." states her depression has improved slightly with stopping the Geodon. Patient continues to deny suicidal or homicidal ideations. Denies thoughts of self injurious behaviors.  Reports her racing thoughts has improved some and reports she is resting well at night. Of note patient reports left hand tremor that started day. Support. encouragement and reassurance was provided.    HPI:  Visit Diagnosis:    ICD-10-CM   1. Severe episode of recurrent major depressive disorder, without psychotic features ([place]) F33.2     Past Psychiatric History:   Past Medical History:  Past Medical History:  Diagnosis Date  . Colitis 11/2014   acute onset bloody diarrhea. C diff, O&P negative.   . Hepatitis C 04/2016  . Left sided ulcerative colitis ([place])   . Polysubstance abuse ([place]) 02/24/2014   tox screen + for THC, Barbiturates, amphetimine.    Past Surgical History:  Procedure Laterality Date  . FLEXIBLE SIGMOIDOSCOPY N/A 12/10/2014   Procedure: FLEXIBLE SIGMOIDOSCOPY;  Surgeon: Mauri Pole, MD;  Location: [facility];  Service: Endoscopy;  Laterality: N/A;    Family Psychiatric History:    Family History:  Family History  Problem Relation Age of Onset  . ADD / ADHD Sister   . Anxiety disorder Sister   . Drug abuse Paternal Aunt   . Alcohol abuse Maternal Grandfather   . Alcohol abuse Maternal Grandmother   . Alcohol abuse Paternal Grandfather   . Alcohol abuse Paternal Grandmother   . Anxiety disorder Paternal Grandmother   . Drug abuse Paternal Grandmother   . Colon cancer Neg Hx   . Esophageal cancer Neg Hx   . Rectal cancer Neg Hx   . Stomach cancer Neg Hx     Social History:  Social History   Socioeconomic History  . Marital status: Single    Spouse name: Not on file  . Number of children: 0  . Years of education: Not on file  . Highest education level: Not on file  Occupational History  . Occupation: [employer]   Social Needs  . Financial resource strain: Not hard at all  . Food insecurity:    Worry: Never true    Inability: Never true  . Transportation needs:    Medical: No    Non-medical: No  Tobacco Use  . Smoking status: Former Smoker    Packs/day: 0.25    Types: Cigarettes, E-cigarettes  . Smokeless tobacco: Never Used  . Tobacco comment: Reports desire to quite e-cigarettes   Substance and Sexual Activity  . Alcohol use: No    Alcohol/week: 0.0 oz  . Drug use: No    Types: Marijuana    Comment: not used since 02/2014  . Sexual activity: Yes  Partners: Female  Lifestyle  . Physical activity:    Days per week: 0 days    Minutes per session: Not on file  . Stress: To some extent  Relationships  . Social connections:    Talks on phone: More than three times a week    Gets together: Never    Attends religious service: More than 4 times per year    Active member of club or organization: No    Attends meetings of clubs or organizations: Never    Relationship status: Living with partner  Other Topics Concern  . Not on file  Social History Narrative  . Not on file    Allergies: No Known Allergies  Metabolic Disorder Labs: Lab  Results  Component Value Date   HGBA1C 5.1 12/08/2014   MPG 100 12/08/2014   No results found for: PROLACTIN No results found for: CHOL, TRIG, HDL, CHOLHDL, VLDL, LDLCALC Lab Results  Component Value Date   TSH 1.264 03/22/2015   TSH 4.967 (H) 12/08/2014    Therapeutic Level Labs: No results found for: LITHIUM No results found for: VALPROATE No components found for:  CBMZ  Current Medications: Current Outpatient Medications  Medication Sig Dispense Refill  . APRISO 0.375 g 24 hr capsule TAKE 4 CAPSULES (1.5 G TOTAL) BY MOUTH DAILY. 120 capsule 0  . ARIPiprazole (ABILIFY) 5 MG tablet Take 5 mg by mouth every morning.    . DULoxetine (CYMBALTA) 30 MG capsule Take 30 mg by mouth daily.  1  . gabapentin (NEURONTIN) 300 MG capsule Take 300 mg by mouth daily. Takes 1 in the morning, 2 in the afternoon, and 2 at night.    . hydrOXYzine (ATARAX/VISTARIL) 25 MG tablet Take 1 tablet (25 mg total) by mouth 3 (three) times daily as needed for anxiety. 90 tablet 0  . Multiple Vitamin (MULTIVITAMIN WITH MINERALS) TABS tablet Take 1 tablet by mouth daily. 30 tablet 0  . oxcarbazepine (TRILEPTAL) 600 MG tablet Take 900 mg by mouth at bedtime.  5  . ziprasidone (GEODON) 60 MG capsule Take 60 mg by mouth 2 (two) times daily.  1  . zonisamide (ZONEGRAN) 50 MG capsule Take 400 mg by mouth at bedtime.  3   No current facility-administered medications for this visit.      Musculoskeletal: Strength & Muscle Tone: within normal limits Gait & Station: normal Patient leans: N/A  Psychiatric Specialty Exam: ROS  There were no vitals taken for this visit.There is no height or weight on file to calculate BMI.  General Appearance: Casual  Eye Contact:  Fair  Speech:  Clear and Coherent  Volume:  Normal  Mood:  Depressed  Affect:  Depressed and Flat  Thought Process:  Coherent  Orientation:  Full (Time, Place, and Person)  Thought Content: Hallucinations: None   Suicidal Thoughts:  No   Homicidal Thoughts:  No  Memory:  Immediate;   Fair Recent;   Fair Remote;   Fair  Judgement:  Fair  Insight:  Fair  Psychomotor Activity:  Normal  Concentration:  Concentration: Fair  Recall:  [employer] of Knowledge: Fair  Language: Fair  Akathisia:  No  Handed:  Right  AIMS (if indicated):   Assets:  Communication Skills Desire for Improvement Resilience Social Support  ADL's:  Intact  Cognition: WNL  Sleep:  Fair   Screenings: GAD-7     Counselor from 06/17/2017 in [place]  Total GAD-7 Score  19    PHQ2-9  Counselor from 06/14/2017 in [place] Most recent reading at 06/14/2017 11:35 AM Counselor from 06/17/2017 in [place] Most recent reading at 06/14/2017  9:00 AM Office Visit from 06/28/2016 in [facility] Most recent reading at 06/28/2016  2:30 PM  PHQ-2 Total Score  6  6  2   PHQ-9 Total Score  22  24  6        Assessment and Plan:  Continue PHP Continue OT  Medication titration:    - Gabapentin 300 mg p.o 3 times daily to decrease to 300 mg BID   - Trileptal 1200 mg to 900 mg p.o QHS decrease for 1 week and then 600 mg   - Zonisamide 400 mg p.o QHS decrease to 200 mg p.o QHS   - Cymbalta 30 mg by mouth for 14 days then stop medication  -  Start Celexa 10 mg PO QD  - Continue Vistaril 25 mg PO TID PRN    - Discontinued Geodon 60 mg BID   - Continue Abilify 5 mg po QD    Treatment plan was agreed and reviewed by N.P T[facility]Lewis and patient Stephanne Greeley need for continued group treatment.   Derrill Center, NP 06/25/2017, 12:37 PM

## 2017-06-25 NOTE — Therapy (Signed)
[facility] PARTIAL HOSPITALIZATION PROGRAM [address] [place], [place], [address] Phone: [phone]   Fax:  [phone]  Occupational Therapy Treatment  Patient Details  Name: Nicole Reeves MRN: [id number] Date of Birth: 01-Aug-1985 Referring Provider: Hillery Jacks, NP   Encounter Date: 06/25/2017  OT End of Session - 06/25/17 1348    Visit Number  3    Number of Visits  6    Date for OT Re-Evaluation  07/12/17    Authorization Type  [employer]    OT Start Time  1145    OT Stop Time  1230    OT Time Calculation (min)  45 min    Activity Tolerance  Patient tolerated treatment well    Behavior During Therapy  [facility] for tasks assessed/performed       Past Medical History:  Diagnosis Date  . Colitis 11/2014   acute onset bloody diarrhea. C diff, O&P negative.   . Hepatitis C 04/2016  . Left sided ulcerative colitis (HCC)   . Polysubstance abuse (HCC) 02/24/2014   tox screen + for THC, Barbiturates, amphetimine.    Past Surgical History:  Procedure Laterality Date  . FLEXIBLE SIGMOIDOSCOPY N/A 12/10/2014   Procedure: FLEXIBLE SIGMOIDOSCOPY;  Surgeon: Napoleon Form, MD;  [facility];  Service: Endoscopy;  Laterality: N/A;    There were no vitals filed for this visit.  Subjective Assessment - 06/25/17 1346    Currently in Pain?  No/denies            S: "When I talk to my friend about our relationship and how she hurt my feelings I want to be assertive" O: Education and activities given in reference to increase assertiveness skills within daily life and relationships. Assertiveness quiz given to increase insight on pt current skills and how to improve based on given score. Further education given on assertive conversation, situations, body language, and appropriate context for skill. Worksheet given to identify three definitions (assertive, passive, and aggressive) with opportunity for role play between 2 participants in to  promote assertiveness training in a variety of social settings (individuals in community and health care providers). Pt asked to identify one area to increase assertiveness this date. Further education given on the importance of assertiveness mentors and online research to continue skill building in this area increase quality of life. A: Pt was actively engaged throughout entirety of group this date. Pt completed assertiveness quiz showing she is naturally more assertive, but still needs improvement to be consistent - pt in agreeance with this score. Pt expressed reflective worksheets/quiz helped gain insight to help imprive future conversations in the workplace and with friends. Pt identified she has a tendency to be much more passive at work, but will use assertiveness skills to improve this behavior. Pt actively participated in role playing activity to practice being a random, aggressive community member to be confronted by another assertive group member. Overall, pt shows need to continue building assertiveness skills. In reference to last activity mentioned above, identified goal of increasing assertiveness when talking to specific friend who she feels is putting her down at this time. P: Pt provided with assertiveness skills to implement into a variety of daily social situations. OT will continue to follow up with communication skills for successful implementation into daily life.                OT Education - 06/25/17 1346    Education provided  Yes    Education Details  education provided on assertiveness skills in relation to daily life and social situations    Person(s) Educated  Patient    Methods  Explanation;Demonstration;Handout    Comprehension  Verbalized understanding;Returned demonstration       OT Short Term Goals - 06/18/17 1456      OT SHORT TERM GOAL #1   Title  Patient will be educated on strategies to improve psychosocial skills needed to participate fully in all  daily, work, and leisure activities.    Time  3    Period  Weeks    Status  New    Target Date  07/12/17      OT SHORT TERM GOAL #2   Title  Patient will be educated on a HEP and independent with implementation of HEP.    Time  3    Period  Weeks    Status  New      OT SHORT TERM GOAL #3   Title  Patient will independently apply psychosocial skills and coping mechanisms to her daily activities in order to function independently.    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 Chronic hepatitis C without hepatic coma (HCC) 06/28/2016  . Vaccine counseling 06/28/2016  . Diarrhea   . Blood in stool   . Hyponatremia 12/07/2014  . Acute colitis 12/07/2014   Dalphine Handing, MSOT, OTR/L  [place] 06/25/2017, 1:50 PM  [facility] HOSPITALIZATION PROGRAM [address] [place], [place], [address] Phone: [phone]   Fax:  [phone]  Name: Masae Lukacs MRN: [id number] Date of Birth: [birth year]

## 2017-06-26 ENCOUNTER — Encounter (HOSPITAL_COMMUNITY): Payer: Self-pay

## 2017-06-26 ENCOUNTER — Other Ambulatory Visit (HOSPITAL_COMMUNITY): Payer: 59 | Attending: Psychiatry | Admitting: Licensed Clinical Social Worker

## 2017-06-26 ENCOUNTER — Other Ambulatory Visit (HOSPITAL_COMMUNITY): Payer: 59

## 2017-06-26 VITALS — BP 110/74 | HR 91 | Ht 67.0 in | Wt 152.0 lb

## 2017-06-26 DIAGNOSIS — F411 Generalized anxiety disorder: Secondary | ICD-10-CM | POA: Diagnosis not present

## 2017-06-26 DIAGNOSIS — F22 Delusional disorders: Secondary | ICD-10-CM | POA: Diagnosis present

## 2017-06-26 DIAGNOSIS — F332 Major depressive disorder, recurrent severe without psychotic features: Secondary | ICD-10-CM | POA: Diagnosis not present

## 2017-06-26 NOTE — Psych (Signed)
[facility] BH PHP THERAPIST PROGRESS NOTE  Nicole Reeves [id number]  Session Time: 9:00 - 10:30  Participation Level: Active  Behavioral Response: CasualAlertAnxious and Depressed  Type of Therapy: Group Therapy, Psychotherapy  Treatment Goals addressed: Coping  Interventions: CBT, DBT, Solution Focused, Supportive and Reframing  Summary: Clinician led check-in regarding current stressors and situation, and review of patient completed daily inventory. Clinician utilized active listening and empathetic response and validated patient emotions. Clinician facilitated processing group on pertinent issues.   Therapist Response: Macarena Langseth is a 32 y.o. female who presents with anxiety and depression symptoms, and possible paranoia. Pt arrived within time allowed and reports she is feeling "mood wise okay, super tired." Pt rates her mood at a 5 on a scale of 1-10 with 10 being great. Pt reports that she had a pretty good weekend on her AA retreat. Pt shares some conflict with a close friend and is able to process how to address it. Pt reports that she needs continued work on honest communication. Pt engaged in discussion.     Session Time: 10:30 -11:15    Participation Level: Active   Behavioral Response: CasualAlertDepressed   Type of Therapy: Group Therapy, Psychotherapy; Psychoeducation   Treatment Goals addressed: Coping   Interventions: CBT, Solution focused, Supportive, Reframing   Summary: Clinician introduced topic of communication. Cln educated group on 4 communication styles: assertive, aggressive, passive, and passive-aggressive. Group gave examples of the 4 styles and identified which is their default style.   Therapist Response: Patient engaged activity and discussion. Pt reports default communication style of passive-agressive. Pt reports understanding of the communication styles and how they set tone for interpersonal communication.       Session Time: 11:15  - 12:15  Participation Level: Active   Behavioral Response: CasualAlertDepressed   Type of Therapy: Group Therapy, OT   Treatment Goals addressed: Coping   Interventions: Psychosocial skills training, Supportive,    Summary:  Occupational Therapy group   Therapist Response: Patient engaged in group. See OT note.         Session Time: 12:15 - 1:00  Participation Level: Active  Behavioral Response: CasualAlertDepressed  Type of Therapy: Group Therapy, Activity Therapy  Treatment Goals addressed: Coping  Interventions: Psychologist, occupational, Supportive  Summary:  Reflection Group: Patients encouraged to practice skills and interpersonal techniques or work on mindfulness and relaxation techniques. The importance of self-care and making skills part of a routine to increase usage were stressed   Therapist Response: Patient engaged and participated appropriately.       Session Time: 12:45- 2:00  Participation Level: Active  Behavioral Response: CasualAlertAnxious and Depressed  Type of Therapy: Group Therapy, Psychoeducation; Psychotherapy  Treatment Goals addressed: Coping  Interventions: CBT; Solution focused; Supportive; Reframing  Summary: 12:45 - 1:50: Cln continued topic of communication. Clinician discussed assertive traits and how to alter statements into more assertive communications. Group provided examples from their own lives.  1:50 -2:00 Clinician led check-out. Clinician assessed for immediate needs, medication compliance and efficacy, and safety concerns   Therapist Response: Patient engaged in group. Pt was able to give examples of times in which she was not assertive and alter those situations to be more assertive.  At check-out, patient rates her mood at a 7 on a scale of 1-10 with 10 being great. Patient reports plans of taking a nap and running errands.  Patient demonstrates some progress as evidenced by an increased openness with group. Patient  denies SI/HI/self-harm at the end of  group.     Suicidal/Homicidal: Nowithout intent/plan   Plan: Pt will continue in PHP to continue addressing depression and anxiety symptoms and increased ability to manage these symptoms.   Diagnosis: Generalized anxiety disorder [F41.1]    1. Generalized anxiety disorder   2. Severe episode of recurrent major depressive disorder, without psychotic features (HCC)       [name], LCSW, LCAS 06/26/2017

## 2017-06-26 NOTE — Progress Notes (Signed)
Patient presents with flat affect, more level mood and reported feeling less "foggy" today since she has stopped her Geodon.  Reports feels better on Abilify and has not started Celexa yet.  Plans to speak with nurse practitioner today to verify how she is to taper off Cymbalta and start Celexa or if okay to just change from one to the other.  States her left arm is feeling better than the previous week but still "twitches some at night".  Discussed if this started after starting Geodon and patient reported it did so she will monitor to see if stops completely now that Geodon has been stopped.  Patient reported no other concerns this date or problems with medications.  Denies any suicidal or homicidal ideations, no auditory or visual hallucinations and reports being happy her medications are being adjusted.  Patient rated her current level of depression a 4, anxiety a 6 and hopelessness a 0 on a scale of 0-10 with 0 being none and 10 the worst she could manage.  Reports anxiety only up a little today as she admits forgetting to take Hydroxyzine prior to coming to [facility] today and left the medication at home.  States she has enjoyed group and reported "I find it really helpful".  Patient reported also sleeping better now that she is off Geodon and reports sleeping 10.5 hours the previous night and no problems with appetite.  Patient stable today and plans to continue to work with NP and then later with Dr. Rene Kocher to fine tune her medication regimen.  Patient will inform this nurse or NP if any continued problems with shoulder or potential side effects to medications.  Denies any other concerns at this time.

## 2017-06-27 ENCOUNTER — Encounter (HOSPITAL_COMMUNITY): Payer: Self-pay

## 2017-06-27 ENCOUNTER — Other Ambulatory Visit (HOSPITAL_COMMUNITY): Payer: Self-pay

## 2017-06-27 ENCOUNTER — Other Ambulatory Visit (HOSPITAL_COMMUNITY): Payer: 59 | Admitting: Occupational Therapy

## 2017-06-27 ENCOUNTER — Other Ambulatory Visit (HOSPITAL_COMMUNITY): Payer: 59 | Admitting: Licensed Clinical Social Worker

## 2017-06-27 DIAGNOSIS — F332 Major depressive disorder, recurrent severe without psychotic features: Secondary | ICD-10-CM

## 2017-06-27 DIAGNOSIS — F411 Generalized anxiety disorder: Secondary | ICD-10-CM | POA: Diagnosis not present

## 2017-06-27 DIAGNOSIS — R4589 Other symptoms and signs involving emotional state: Secondary | ICD-10-CM

## 2017-06-27 DIAGNOSIS — F22 Delusional disorders: Secondary | ICD-10-CM

## 2017-06-27 DIAGNOSIS — F0789 Other personality and behavioral disorders due to known physiological condition: Secondary | ICD-10-CM

## 2017-06-27 MED ORDER — DULOXETINE HCL 30 MG PO CPEP
30.0000 mg | ORAL_CAPSULE | Freq: Every day | ORAL | 0 refills | Status: DC
Start: 2017-06-27 — End: 2017-07-25

## 2017-06-27 NOTE — Therapy (Signed)
[facility] PARTIAL HOSPITALIZATION PROGRAM [address] [place], [place], [address] Phone: [phone]   Fax:  [phone]  Occupational Therapy Treatment  Patient Details  Name: Nicole Reeves MRN: [id number] Date of Birth: 1985-04-28 Referring Provider: Hillery Jacks, NP   Encounter Date: 06/27/2017  OT End of Session - 06/27/17 1349    Visit Number  4    Number of Visits  6    Date for OT Re-Evaluation  07/12/17    Authorization Type  [employer]    OT Start Time  1130    OT Stop Time  1230    OT Time Calculation (min)  60 min    Activity Tolerance  Patient tolerated treatment well    Behavior During Therapy  [facility] for tasks assessed/performed       Past Medical History:  Diagnosis Date  . Colitis 11/2014   acute onset bloody diarrhea. C diff, O&P negative.   . Hepatitis C 04/2016  . Left sided ulcerative colitis (HCC)   . Polysubstance abuse (HCC) 02/24/2014   tox screen + for THC, Barbiturates, amphetimine.    Past Surgical History:  Procedure Laterality Date  . FLEXIBLE SIGMOIDOSCOPY N/A 12/10/2014   Procedure: FLEXIBLE SIGMOIDOSCOPY;  Surgeon: Napoleon Form, MD;  [facility]: [facility];  Service: Endoscopy;  Laterality: N/A;    There were no vitals filed for this visit.  Subjective Assessment - 06/27/17 1346    Currently in Pain?  No/denies      S: "I am learning to say no to different events that are too much in AA". O: Education given on definition and importance of positive self-esteem in daily life and relationships with focus on using positive self-talk. Further education given on the relationship between self-esteem and mental illness and both high and low self-esteem factors. Worksheet given for pt to identify a positive trait about themselves with each letter of the alphabet to use as reference for future times of need and to gain insight on numerous positive qualities. Expressive activity then given for pt to design an  advertisement as to why someone should be their friend to share and reflect with the group. Encouragement given to display both worksheet and expressive activity in common viewed area (bathroom, bedroom) to help foster self-esteem in daily life.  A: Pt was actively engaged and participatory throughout entirety of group this date. Pt completed self-esteem alphabet activity with minimal verbal cues. Pt also provided support to other group members to help brainstorm ideas. Pt also completed expressive advertisement activity with self-direction and no verbal cues. Pt showed an improved affect when reflecting on positive qualities during treatment activities this date.  P: Pt provided with self-esteem boosting skills to implement into a variety of daily activities/routines. OT will continue to follow up with communication skills for successful implementation into daily life.                      OT Education - 06/27/17 1348    Education provided  Yes    Education Details  education provided on maintaining and creating positive self esteem    Person(s) Educated  Patient    Methods  Explanation;Handout    Comprehension  Verbalized understanding       OT Short Term Goals - 06/18/17 1456      OT SHORT TERM GOAL #1   Title  Patient will be educated on strategies to improve psychosocial skills needed to participate fully in all daily,  work, and leisure activities.    Time  3    Period  Weeks    Status  New    Target Date  07/12/17      OT SHORT TERM GOAL #2   Title  Patient will be educated on a HEP and independent with implementation of HEP.    Time  3    Period  Weeks    Status  New      OT SHORT TERM GOAL #3   Title  Patient will independently apply psychosocial skills and coping mechanisms to her daily activities in order to function independently.    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 Chronic hepatitis C without hepatic coma (HCC) 06/28/2016  . Vaccine counseling 06/28/2016  . Diarrhea   . Blood in stool   . Hyponatremia 12/07/2014  . Acute colitis 12/07/2014   Dalphine Handing, MSOT, OTR/L  [place] 06/27/2017, 1:58 PM  [facility] PARTIAL HOSPITALIZATION PROGRAM [address] [place], [place], [address] Phone: [phone]   Fax:  [phone]  Name: Nicole Reeves MRN: [id number] Date of Birth: [birth year]

## 2017-06-27 NOTE — Psych (Signed)
   [facility] BH PHP THERAPIST PROGRESS NOTE  Nicole Reeves [id number]  Session Time: 9:00 - 10:45  Participation Level: Active  Behavioral Response: CasualAlertAnxious and Depressed  Type of Therapy: Group Therapy, Psychotherapy  Treatment Goals addressed: Coping  Interventions: CBT, DBT, Solution Focused, Supportive and Reframing  Summary: Clinician led check-in regarding current stressors and situation, and review of patient completed daily inventory. Clinician utilized active listening and empathetic response and validated patient emotions. Clinician facilitated processing group on pertinent issues.   Therapist Response: Lynda Wanninger is a 32 y.o. female who presents with anxiety and depression symptoms. Patient arrived within time allowed and reports she is feeling "just okay." Pt rates her mood at a 5 on a scale of 1-10 with 10 being great. Pt reports she is having medication adjustments and is feeling clearer headed stopping Geodon. Pt states she feels more focused. Pt reports she is still working on managing obsessive thoughts. Pt engaged in discussion.      Session Time: 10:45 -12:15  Participation Level: Active  Behavioral Response: CasualAlertDepressed  Type of Therapy: Group Therapy, psychotherapy  Treatment Goals addressed: Coping  Interventions: Strengths based, reframing, Supportive,   Summary:  Spiritual Care group  Therapist Response: Patient engaged in group. See chaplain note.         Session Time: 12:15 - 1:00  Participation Level: Active  Behavioral Response: CasualAlertDepressed  Type of Therapy: Group Therapy, Activity Therapy  Treatment Goals addressed: Coping  Interventions: Psychologist, occupational, Supportive  Summary:  Reflection Group: Patients encouraged to practice skills and interpersonal techniques or work on mindfulness and relaxation techniques. The importance of self-care and making skills part of a routine to increase usage  were stressed   Therapist Response: Patient engaged and participated appropriately.       Session Time: 1:00 - 2:00   Participation Level: Active   Behavioral Response: CasualAlertDepressed   Type of Therapy: Group Therapy, Psychotherapy; Psychoeducation   Treatment Goals addressed: Coping   Interventions: CBT, Solution focused, Supportive, Reframing   Summary: 1:00 - 1:50 Cln continued topic of communication. Cln led review of what has been discussed. Cln discussed handout "The characteristics of bad communication" and group shared how they have engaged with those characteristics and how to avoid them in the future. 1:50 -2:00 Clinician led check-out. Clinician assessed for immediate needs, medication compliance and efficacy, and safety concerns   Therapist Response: Patient engaged in group. Pt recognized characteristics she has used particularly stonewalling and passive aggression.  At check-out, patient rates her mood at a 6 on a scale of 1-10 with 10 being great. Patient reports she has a celebration dinner with her partner planned this afternoon. Patient demonstrates some progress as evidenced by increased sharing in group. Patient denies SI/HI/self-harm thoughts at the end of group     Suicidal/Homicidal: Nowithout intent/plan   Plan: Pt will continue in PHP to continue addressing depression and anxiety symptoms and increased ability to manage these symptoms.   Diagnosis: Generalized anxiety disorder [F41.1]    1. Generalized anxiety disorder   2. Severe episode of recurrent major depressive disorder, without psychotic features (HCC)       [name], LCSW 06/27/2017

## 2017-06-27 NOTE — Psych (Signed)
[facility] BH PHP THERAPIST PROGRESS NOTE  Nicole Reeves [id number]  Session Time: 9:00 - 10:30  Participation Level: Active  Behavioral Response: CasualAlertAnxious and Depressed  Type of Therapy: Group Therapy, Psychotherapy  Treatment Goals addressed: Coping  Interventions: CBT, DBT, Solution Focused, Supportive and Reframing  Summary: Clinician led check-in regarding current stressors and situation, and review of patient completed daily inventory. Clinician utilized active listening and empathetic response and validated patient emotions. Clinician facilitated processing group on pertinent issues.   Therapist Response: Nicole Reeves is a 32 y.o. female who presents with anxiety and depression symptoms, and possible paranoia. Pt arrived within time allowed and reports she is feeling "pretty good, mellow." Pt rates her mood at a 6 on a scale of 1-10 with 10 being great. Pt reports she took a long nap yesterday and feels more rested after her long weekend. Pt shares she has been thinking about a conflict with her friend and was able to increase specificity on what was upsetting her. Pt reports she made a plan to address it.  Pt reports struggling with rumination. Pt engaged in discussion.      Session Time: 10:30 -11:15    Participation Level: Active   Behavioral Response: CasualAlertDepressed   Type of Therapy: Group Therapy, Psychotherapy; Psychoeducation   Treatment Goals addressed: Coping   Interventions: CBT, Solution focused, Supportive, Reframing   Summary: Cln introduced topic of communication. Cln discussed 3 components of communication: nonverbals, words, and listening. Group discussed nonverbals: what they are and how they affect communication.    Therapist Response: Patient engaged in group. Pt was able to give examples of nonverbal and demonstrated awareness of how her nonverbals are taken. Pt reports needing most improvement with facial expression and  tone.        Session Time: 11:15 - 12:15  Participation Level: Active   Behavioral Response: CasualAlertDepressed   Type of Therapy: Group Therapy, OT   Treatment Goals addressed: Coping   Interventions: Psychosocial skills training, Supportive,    Summary:  Occupational Therapy group   Therapist Response: Patient engaged in group. See OT note.         Session Time: 12:15 - 1:00  Participation Level: Active  Behavioral Response: CasualAlertDepressed  Type of Therapy: Group Therapy, Activity Therapy  Treatment Goals addressed: Coping  Interventions: Psychologist, occupational, Supportive  Summary:  Reflection Group: Patients encouraged to practice skills and interpersonal techniques or work on mindfulness and relaxation techniques. The importance of self-care and making skills part of a routine to increase usage were stressed   Therapist Response: Patient engaged and participated appropriately.       Session Time: 12:45- 2:00  Participation Level: Active  Behavioral Response: CasualAlertAnxious and Depressed  Type of Therapy: Group Therapy, Psychoeducation; Psychotherapy  Treatment Goals addressed: Coping  Interventions: CBT; Solution focused; Supportive; Reframing  Summary: 12:45 - 1:50: Cln continued topic of communication. Cln introduced "I" Statements and how to formulate them as well as common pitfalls and how to avoid them. 1:50 -2:00 Clinician led check-out. Clinician assessed for immediate needs, medication compliance and efficacy, and safety concerns   Therapist Response: Patient engaged in group. Pt reports understanding of "I" Statements and successfully formulated her own in practice.   At check-out, patient rates her mood at a 5 on a scale of 1-10 with 10 being great. Patient reports plans of running errands this evening.  Patient demonstrates some progress as evidenced by reporting increased hopefulness. Patient denies SI/HI/self-harm at the  end of  group.    Suicidal/Homicidal: Nowithout intent/plan   Plan: Pt will continue in PHP to continue addressing depression and anxiety symptoms and increased ability to manage these symptoms.   Diagnosis: Severe episode of recurrent major depressive disorder, without psychotic features (HCC) [F33.2]    1. Severe episode of recurrent major depressive disorder, without psychotic features (HCC)   2. Generalized anxiety disorder       [name], LCSW, LCAS 06/27/2017

## 2017-06-28 ENCOUNTER — Other Ambulatory Visit (HOSPITAL_COMMUNITY): Payer: 59 | Admitting: Licensed Clinical Social Worker

## 2017-06-28 ENCOUNTER — Encounter (HOSPITAL_COMMUNITY): Payer: Self-pay | Admitting: Occupational Therapy

## 2017-06-28 ENCOUNTER — Other Ambulatory Visit (HOSPITAL_COMMUNITY): Payer: 59 | Admitting: Occupational Therapy

## 2017-06-28 ENCOUNTER — Telehealth (HOSPITAL_COMMUNITY): Payer: Self-pay | Admitting: Licensed Clinical Social Worker

## 2017-06-28 DIAGNOSIS — F411 Generalized anxiety disorder: Secondary | ICD-10-CM

## 2017-06-28 DIAGNOSIS — F0789 Other personality and behavioral disorders due to known physiological condition: Secondary | ICD-10-CM

## 2017-06-28 DIAGNOSIS — F332 Major depressive disorder, recurrent severe without psychotic features: Secondary | ICD-10-CM

## 2017-06-28 DIAGNOSIS — F22 Delusional disorders: Secondary | ICD-10-CM

## 2017-06-28 DIAGNOSIS — R4589 Other symptoms and signs involving emotional state: Secondary | ICD-10-CM

## 2017-06-28 NOTE — Therapy (Signed)
[facility] PARTIAL HOSPITALIZATION PROGRAM [address] [place], [place], [address] Phone: [phone]   Fax:  [phone]  Occupational Therapy Treatment  Patient Details  Name: Nicole Reeves MRN: [id number] Date of Birth: [birth year] Referring Provider: Hillery Jacks, NP   Encounter Date: 06/28/2017  OT End of Session - 06/28/17 1424    Visit Number  5    Number of Visits  6    Date for OT Re-Evaluation  07/12/17    Authorization Type  [employer]    OT Start Time  1030    OT Stop Time  1130    OT Time Calculation (min)  60 min    Activity Tolerance  Patient tolerated treatment well    Behavior During Therapy  [facility] for tasks assessed/performed       Past Medical History:  Diagnosis Date  . Colitis 11/2014   acute onset bloody diarrhea. C diff, O&P negative.   . Hepatitis C 04/2016  . Left sided ulcerative colitis (HCC)   . Polysubstance abuse (HCC) 02/24/2014   tox screen + for THC, Barbiturates, amphetimine.    Past Surgical History:  Procedure Laterality Date  . FLEXIBLE SIGMOIDOSCOPY N/A 12/10/2014   Procedure: FLEXIBLE SIGMOIDOSCOPY;  Surgeon: Napoleon Form, MD;  Location: [facility]OS[facility];  Service: Endoscopy;  Laterality: N/A;    There were no vitals filed for this visit.  Subjective Assessment - 06/28/17 1422    Currently in Pain?  No/denies       S: "I used to be obsessive over exercise and now I do not exercise at all"  O: Education given on the importance of exercise in daily routines as healthy coping strategies. Contraindications and health exercise patterns discussed for safety. Pt provided with hand outs of chair exercise and yoga, with return demonstration during group session this date. Exercise log given to track workouts for added structure in daily routine. Pt to identify short term/achievable exercise goal to implement into daily routines within the next week.  A: Pt was actively engaged and participatory throughout the  entirety of group this date. Pt completed return demonstration of chair exercise and yoga with group. Pt engaged with facilitator for tips and questions of proper form during exercise. Pt identified goal of jogging consistently throughout the week and starting a yoga class. Pt also showed interest in using hand outs provided for continued carry over into daily routine. P: Pt provided with skills to implement exercise/yoga into a variety of daily activities/routines. OT will continue to follow up with communication skills for successful implementation into daily life.                     OT Education - 06/28/17 1423    Education provided  Yes    Education Details  eduation provided on importance of exercise/yoga in daily routines as coping strategy    Person(s) Educated  Patient    Methods  Explanation;Demonstration;Handout    Comprehension  Verbalized understanding       OT Short Term Goals - 06/18/17 1456      OT SHORT TERM GOAL #1   Title  Patient will be educated on strategies to improve psychosocial skills needed to participate fully in all daily, work, and leisure activities.    Time  3    Period  Weeks    Status  New    Target Date  07/12/17      OT SHORT TERM GOAL #2   Title  Patient will be educated on a HEP and independent with implementation of HEP.    Time  3    Period  Weeks    Status  New      OT SHORT TERM GOAL #3   Title  Patient will independently apply psychosocial skills and coping mechanisms to her daily activities in order to function independently.    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 Chronic hepatitis C without hepatic coma (HCC) 06/28/2016  . Vaccine counseling 06/28/2016  . Diarrhea   . Blood in stool   . Hyponatremia 12/07/2014  . Acute colitis 12/07/2014   Dalphine Handing, MSOT, OTR/L   [place] 06/28/2017, 2:26 PM  [facility] PARTIAL HOSPITALIZATION PROGRAM [address] [place], [place], [address] Phone: [phone]   Fax:  [phone]  Name: Nicole Reeves MRN: [id number] Date of Birth: Aug 24, 1985

## 2017-07-01 ENCOUNTER — Other Ambulatory Visit (HOSPITAL_COMMUNITY): Payer: 59 | Admitting: Licensed Clinical Social Worker

## 2017-07-01 ENCOUNTER — Encounter (HOSPITAL_COMMUNITY): Payer: Self-pay

## 2017-07-01 ENCOUNTER — Other Ambulatory Visit (HOSPITAL_COMMUNITY): Payer: 59 | Admitting: Occupational Therapy

## 2017-07-01 DIAGNOSIS — R4589 Other symptoms and signs involving emotional state: Secondary | ICD-10-CM

## 2017-07-01 DIAGNOSIS — F332 Major depressive disorder, recurrent severe without psychotic features: Secondary | ICD-10-CM

## 2017-07-01 DIAGNOSIS — F411 Generalized anxiety disorder: Secondary | ICD-10-CM

## 2017-07-01 DIAGNOSIS — F0789 Other personality and behavioral disorders due to known physiological condition: Secondary | ICD-10-CM

## 2017-07-01 DIAGNOSIS — F22 Delusional disorders: Secondary | ICD-10-CM

## 2017-07-01 NOTE — Therapy (Signed)
[facility] PARTIAL HOSPITALIZATION PROGRAM [address] [place], [place], [address] Phone: [phone]   Fax:  [phone]  Occupational Therapy Treatment  Patient Details  Name: Nicole Reeves MRN: [id number] Date of Birth: February 10, 1986 Referring Provider: Hillery Jacks, NP   Encounter Date: 07/01/2017  OT End of Session - 07/01/17 1520    Visit Number  6    Number of Visits  6    Date for OT Re-Evaluation  07/12/17    Authorization Type  [employer]    OT Start Time  1130    OT Stop Time  1230    OT Time Calculation (min)  60 min    Activity Tolerance  Patient tolerated treatment well    Behavior During Therapy  [facility] for tasks assessed/performed       Past Medical History:  Diagnosis Date  . Colitis 11/2014   acute onset bloody diarrhea. C diff, O&P negative.   . Hepatitis C 04/2016  . Left sided ulcerative colitis (HCC)   . Polysubstance abuse (HCC) 02/24/2014   tox screen + for THC, Barbiturates, amphetimine.    Past Surgical History:  Procedure Laterality Date  . FLEXIBLE SIGMOIDOSCOPY N/A 12/10/2014   Procedure: FLEXIBLE SIGMOIDOSCOPY;  Surgeon: Napoleon Form, MD;  Location: [facility];  Service: Endoscopy;  Laterality: N/A;    There were no vitals filed for this visit.  Subjective Assessment - 07/01/17 1519    Currently in Pain?  No/denies       S:"I get 8.5-9 hrs of sleep and I usually take an hour nap several times a week"   O: Pt educated on sleep hygiene as it pertains to daily life/routines this date. Sleep hygiene questionnaire administered to increase insight on current sleep habits to help develop future goals. Education given on appropriate sleep routines, sleep disorders, detriments of too much/too little sleep with encouraged feedback of personal Experiences. Sleep diary handout given to challenge pt to track current habits and identify area for change. Pt asked to identify one STG in relation to sleep hygiene to create  better daily sleep habits.   A: Pt completed sleep hygiene questionnaire, identifying current poor sleep habits to improve this date including: better routine management and caffeine consumption. Education received in a positive manner to help improve sleep hygiene in daily life. Pt agreeable to use sleep diary this date. Pt identified goal of limiting caffeine assumption in the afternoons to facilitate healthier sleep patterns.  P: Pt provided with skills to increase sleep hygiene habits into daily routine. OT will continue to follow up with communication skills for successful implementation into daily life.                      OT Education - 07/01/17 1519    Education provided  Yes    Education Details  education given on sleep hygiene skills in daily life    Person(s) Educated  Patient    Methods  Explanation;Handout    Comprehension  Verbalized understanding       OT Short Term Goals - 06/18/17 1456      OT SHORT TERM GOAL #1   Title  Patient will be educated on strategies to improve psychosocial skills needed to participate fully in all daily, work, and leisure activities.    Time  3    Period  Weeks    Status  New    Target Date  07/12/17      OT SHORT  TERM GOAL #2   Title  Patient will be educated on a HEP and independent with implementation of HEP.    Time  3    Period  Weeks    Status  New      OT SHORT TERM GOAL #3   Title  Patient will independently apply psychosocial skills and coping mechanisms to her daily activities in order to function independently.    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 Chronic hepatitis C without hepatic coma (HCC) 06/28/2016  . Vaccine counseling 06/28/2016  . Diarrhea   . Blood in stool   . Hyponatremia 12/07/2014  . Acute colitis 12/07/2014   Dalphine Handing, MSOT, OTR/L  [place] 07/01/2017, 3:27 PM  [facility] PARTIAL HOSPITALIZATION PROGRAM [address] [place], [place], [address] Phone: [phone]   Fax:  [phone]  Name: Kiala Faraj MRN: [id number] Date of Birth: [birth year]

## 2017-07-02 ENCOUNTER — Ambulatory Visit (HOSPITAL_COMMUNITY): Payer: Self-pay | Admitting: Licensed Clinical Social Worker

## 2017-07-02 ENCOUNTER — Other Ambulatory Visit (HOSPITAL_COMMUNITY): Payer: 59 | Admitting: Family

## 2017-07-02 ENCOUNTER — Other Ambulatory Visit (HOSPITAL_COMMUNITY): Payer: 59 | Admitting: Licensed Clinical Social Worker

## 2017-07-02 ENCOUNTER — Telehealth (HOSPITAL_COMMUNITY): Payer: Self-pay | Admitting: Licensed Clinical Social Worker

## 2017-07-02 ENCOUNTER — Encounter (HOSPITAL_COMMUNITY): Payer: Self-pay | Admitting: Occupational Therapy

## 2017-07-02 DIAGNOSIS — F332 Major depressive disorder, recurrent severe without psychotic features: Secondary | ICD-10-CM

## 2017-07-02 DIAGNOSIS — F411 Generalized anxiety disorder: Secondary | ICD-10-CM

## 2017-07-02 DIAGNOSIS — F22 Delusional disorders: Secondary | ICD-10-CM

## 2017-07-02 DIAGNOSIS — F0789 Other personality and behavioral disorders due to known physiological condition: Secondary | ICD-10-CM

## 2017-07-02 DIAGNOSIS — R4589 Other symptoms and signs involving emotional state: Secondary | ICD-10-CM

## 2017-07-02 NOTE — Therapy (Addendum)
[facility] PARTIAL HOSPITALIZATION PROGRAM [address] [place], [place], [address] Phone: [phone]   Fax:  [phone]  Occupational Therapy Treatment  Patient Details  Name: Nicole Reeves MRN: [id number] Date of Birth: 08-02-1985 Referring Provider: Hillery Jacks, NP   Encounter Date: 07/02/2017  OT End of Session - 07/02/17 1248    Visit Number  7    Number of Visits  10    Date for OT Re-Evaluation  07/12/17    Authorization Type  [employer]    OT Start Time  1115    OT Stop Time  1215    OT Time Calculation (min)  60 min    Activity Tolerance  Patient tolerated treatment well    Behavior During Therapy  [facility] for tasks assessed/performed       Past Medical History:  Diagnosis Date  . Colitis 11/2014   acute onset bloody diarrhea. C diff, O&P negative.   . Hepatitis C 04/2016  . Left sided ulcerative colitis (HCC)   . Polysubstance abuse (HCC) 02/24/2014   tox screen + for THC, Barbiturates, amphetimine.    Past Surgical History:  Procedure Laterality Date  . FLEXIBLE SIGMOIDOSCOPY N/A 12/10/2014   Procedure: FLEXIBLE SIGMOIDOSCOPY;  Surgeon: Napoleon Form, MD;  Location: [facility];  Service: Endoscopy;  Laterality: N/A;    There were no vitals filed for this visit.  Subjective Assessment - 07/02/17 1247    Currently in Pain?  No/denies      S: "I have financial goals that I am currently holding myself accountable for at this time"  O: Pt educated on the definition and 3 types of self-accountability (your actions/responsibilities, responsibilities, and goals) with verbal instruction and encouragement to give personal examples. Education further given on self-accountability being in line with personal values and goals to maintain occupational balance. Pt given goal identifying worksheet to list immediate, short term, medium term, and long-term goals using a SMART goal framework (specificity, meaningful, adaptive, realistic, and  time bound). Goals created as guideline for pt to practice being accountable in various situations. Pt completed work sheet of goals and encouraged to share goals with the group, with emphasis on immediate goal for check in with pt for next session.  A: Pt engaged in verbal discussion of definitions of self-accountability in general and with self examples. Pt completed goals work sheet using SMART goal framework, while maintaining in line with values for promotion of occupational balance and help foster continuous practice of accountability skills. Pt identified immediate goal of "I am going to prepare healthy dinners and start working out twice a week" to hold herself accountable for her personal health and fitness goal. P: Pt provided with education on improving accountability. OT will continue to follow up with communication skills for successful implementation into daily life.                       OT Education - 07/02/17 1248    Education provided  Yes    Education Details  education given on improving accountability    Person(s) Educated  Patient    Methods  Explanation;Handout    Comprehension  Verbalized understanding       OT Short Term Goals - 06/18/17 1456      OT SHORT TERM GOAL #1   Title  Patient will be educated on strategies to improve psychosocial skills needed to participate fully in all daily, work, and leisure activities.    Time  3    Period  Weeks    Status  New    Target Date  07/12/17      OT SHORT TERM GOAL #2   Title  Patient will be educated on a HEP and independent with implementation of HEP.    Time  3    Period  Weeks    Status  New      OT SHORT TERM GOAL #3   Title  Patient will independently apply psychosocial skills and coping mechanisms to her daily activities in order to function independently.    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 Chronic hepatitis C without hepatic coma (HCC) 06/28/2016  . Vaccine counseling 06/28/2016  . Diarrhea   . Blood in stool   . Hyponatremia 12/07/2014  . Acute colitis 12/07/2014    Dalphine Handing, MSOT, OTR/L   [place] 07/02/2017, 12:53 PM  [facility] PARTIAL HOSPITALIZATION PROGRAM [address] [place], [place], [address] Phone: [phone]   Fax:  [phone]  Name: Nicole Reeves MRN: [id number] Date of Birth: 06-24-1985

## 2017-07-02 NOTE — Psych (Signed)
[facility] BH PHP THERAPIST PROGRESS NOTE  Nicole Reeves [id number]  Session Time: 9:00 - 10:30  Participation Level: Active  Behavioral Response: CasualAlertAnxious and Depressed  Type of Therapy: Group Therapy, Psychotherapy  Treatment Goals addressed: Coping  Interventions: CBT, DBT, Solution Focused, Supportive and Reframing  Summary: Clinician led check-in regarding current stressors and situation, and review of patient completed daily inventory. Clinician utilized active listening and empathetic response and validated patient emotions. Clinician facilitated processing group on pertinent issues.   Therapist Response: Sherrye Puga is a 32 y.o. female who presents with anxiety and depression symptoms, and possible paranoia. Pt arrived within time allowed and reports she is feeling "worked up, super anxious, shaky." Pt rates her mood at a 3 on a scale of 1-10 with 10 being great. Pt reports she began titrating her medication and and is feeling disoriented. Pt reports she also feels hopeful about the medication changes. Pt reports she had an argument with her partner and was pleased with how she handled it. Pt reports feeling she is improving slowly.      Session Time: 10:30 - 11:15  Participation Level: Active  Behavioral Response: CasualAlertDepressed  Type of Therapy: Group Therapy, Psychotherapy  Treatment Goals addressed: Coping  Interventions: CBT, Solution focused, Supportive, Reframing  Summary:  Cln introduced distress tolerance skills, reviewing their purpose and how to practice them. Cln introduced STOP and TIP skills. Pt's discussed how they could utilize these skills.      Therapist Response: Patient engaged in discussion. Pt reports understanding of skills discussed and shares most likely to use progressive muscle relaxation.       Session Time: 11:15 -12:15   Participation Level: Active   Behavioral Response: CasualAlertDepressed   Type of  Therapy: Group Therapy, OT   Treatment Goals addressed: Coping   Interventions: Psychosocial skills training, Supportive,    Summary:  Occupational Therapy group   Therapist Response: Patient engaged in group. See OT note.        Session Time: 12:15 - 1:00  Participation Level: Active  Behavioral Response: CasualAlertDepressed  Type of Therapy: Group Therapy, Activity Therapy  Treatment Goals addressed: Coping  Interventions: Psychologist, occupational, Supportive  Summary:  Reflection Group: Patients encouraged to practice skills and interpersonal techniques or work on mindfulness and relaxation techniques. The importance of self-care and making skills part of a routine to increase usage were stressed   Therapist Response: Patient engaged and participated appropriately.       Session Time: 1:00- 2:00  Participation Level: Active  Behavioral Response: CasualAlertAnxious and Depressed  Type of Therapy: Group Therapy, Psychoeducation; Psychotherapy  Treatment Goals addressed: Coping  Interventions: CBT; Solution focused; Supportive; Reframing  Summary: 12:45 - 1:50: Clinician continued topic of distress tolerance skills and introduced ACCEPTS skill. Group did "Top 5 List" activity to plan ahead for "E," different emotion.  1:50 -2:00 Clinician led check-out. Clinician assessed for immediate needs, medication compliance and efficacy, and safety concerns   Therapist Response: Patient engaged activity and discussion. Pt reports spending time with friends as a way to utilize "A," activities, and completed 3 top 5 lists.  At check-out, patient rates her mood at a 5 on a scale of 1-10 with 10 being great. Patient reports plans of going to a meeting and having coffee with a friend this afternoon.  Patient demonstrates some progress as evidenced by sharing ways she is utilizing improved communication skills. Patient denies SI/HI/self-harm at the end of  group.     Suicidal/Homicidal: Nowithout  intent/plan   Plan: Pt will continue in PHP to continue addressing depression and anxiety symptoms and increased ability to manage these symptoms.   Diagnosis: Generalized anxiety disorder [F41.1]    1. Generalized anxiety disorder   2. Severe episode of recurrent major depressive disorder, without psychotic features (HCC)       [name], LCSW, LCAS 07/02/2017

## 2017-07-03 ENCOUNTER — Other Ambulatory Visit: Payer: Self-pay

## 2017-07-03 ENCOUNTER — Other Ambulatory Visit: Payer: Self-pay | Admitting: Gastroenterology

## 2017-07-03 ENCOUNTER — Other Ambulatory Visit (HOSPITAL_COMMUNITY): Payer: 59 | Admitting: Licensed Clinical Social Worker

## 2017-07-03 ENCOUNTER — Encounter (HOSPITAL_COMMUNITY): Payer: Self-pay

## 2017-07-03 VITALS — BP 103/70 | HR 80 | Temp 98.2°F | Resp 16 | Wt 154.4 lb

## 2017-07-03 DIAGNOSIS — F411 Generalized anxiety disorder: Secondary | ICD-10-CM | POA: Diagnosis not present

## 2017-07-03 DIAGNOSIS — F332 Major depressive disorder, recurrent severe without psychotic features: Secondary | ICD-10-CM

## 2017-07-03 NOTE — Psych (Signed)
[facility] BH PHP THERAPIST PROGRESS NOTE  Nicole Reeves [id number]  Session Time: 9:00 - 10:30  Participation Level: Active  Behavioral Response: CasualAlertAnxious and Depressed  Type of Therapy: Group Therapy, Psychotherapy  Treatment Goals addressed: Coping  Interventions: CBT, DBT, Solution Focused, Supportive and Reframing  Summary: Clinician led check-in regarding current stressors and situation, and review of patient completed daily inventory. Clinician utilized active listening and empathetic response and validated patient emotions. Clinician facilitated processing group on pertinent issues.   Therapist Response: Nicole Reeves is a 32 y.o. female who presents with anxiety and depression symptoms, and possible paranoia. Pt arrived within time allowed and reports she is feeling "okay, still shaky and weird feeling." Pt rates her mood at a 5 on a scale of 1-10 with 10 being great. Pt reports continuing to feel side effects of titrating medication down and slept poorly due to this. Pt does not describe anything out of normal spectrum of adjustment. Pt reports she had a decent afternoon and had coffee with a friend. Pt reports she has been discussing what she learns in group with her partner and she thinks that is helping their relationship. Pt needing further work on managing rumination.       Session Time: 10:30 -11:30   Participation Level: Active   Behavioral Response: CasualAlertDepressed   Type of Therapy: Group Therapy, OT   Treatment Goals addressed: Coping   Interventions: Psychosocial skills training, Supportive,    Summary:  Occupational Therapy group   Therapist Response: Patient engaged in group. See OT note.           Session Time: 11:30 - 12:00   Participation Level: Active   Behavioral Response: CasualAlertDepressed   Type of Therapy: Group Therapy, Psychotherapy   Treatment Goals addressed: Coping   Interventions: CBT, Solution focused,  Supportive, Reframing   Summary:  Group continued topic of distress tolerance. Cln reviewed skills discussed yesterday and group members discussed ways they put them into practice last night. Cln finished ACCEPTS skill with group.    Therapist Response: Patient engaged in group. Pt states she attempted to use distraction when she was worrying about work and tried to be present when spending time with a friend.        Session Time: 12:00- 1:00  Participation Level: Active  Behavioral Response: CasualAlertAnxious and Depressed  Type of Therapy: Group Therapy, Psychoeducation; Psychotherapy  Treatment Goals addressed: Coping  Interventions: CBT; Solution focused; Supportive; Reframing  Summary: 12:00 - 12:50: Clinician continued topic of distress tolerance skills and introduced Self Soothe skill. Group members discussed ways they would utilize self soothe in their everyday life.   12:50 -1:00 Clinician led check-out. Clinician assessed for immediate needs, medication compliance and efficacy, and safety concerns   Therapist Response: Patient engaged activity and discussion. Pt reports touch, particularly with her pets, as ways she can practice self soothe.  At check-out, patient rates her mood at a 7 on a scale of 1-10 with 10 being great. Patient reports plans of seeing her family this weekend.  Patient demonstrates some progress as evidenced by reporting ways she is applying skills at home. Pt denies SI/HI at end of session.     Suicidal/Homicidal: Nowithout intent/plan   Plan: Pt will continue in PHP to continue addressing depression and anxiety symptoms and increased ability to manage these symptoms.   Diagnosis: Generalized anxiety disorder [F41.1]    1. Generalized anxiety disorder   2. Severe episode of recurrent major depressive disorder, without psychotic features (  HCC)       Nicole Guiles, LCSW, LCAS 07/03/2017

## 2017-07-03 NOTE — Progress Notes (Signed)
"  Nicole Reeves" presented somewhat flat and guarded initially. However, she immediately opened to Clinical research associate and became more spontaneous. She also brightened and smiled at intervals which is vastly different from writer's initial interaction. She states she feels like she is feeling much better. When writer asked her to qualify "feeling much better", she replied, "I have learned some good coping skills and I am using them. My brain fog has lifted since they have adjusted my meds. I am feeling more hopeful. I am also more focused on me/inward (vs. Outward validation)." Support and encouragement provided. She feels like she is ready to leave the program on Friday and step down her level of support. She intends to continue to get therapy and will see Dr. Rene Kocher next week. We discussed her goal she provided during our last interaction: "I just want to feel better about myself on the inside. I have a good job and a good partner, on the outside I look great. On the inside I feel broken and I feel like I shouldn't have a problem.". She smiled when I read it to her. She feels like she is in a much better place compared to that time. She no longer fears her partner leaving her. She states she does think about it at times but not nearly as much and it doesn't seem to affect her as profoundly as it did. She said her "boss" has called to check in with her and they are eagerly awaiting her to return to work. Currently rates her Depression a 6 on a scale of 1-10 (1-no Depression and 10-most Depressed). She rates her Anxiety a 5 on a scale of 1-10 (1-no Anxiety and 10- most Anxiety). She is taking her medications as prescribed and did not complain of any unwanted side effects. Continues to be motivated to taper her meds but will wait to meet with Dr. Rene Kocher and get his recommendation. She denies suicidal or homicidal thoughts. She denies auditory or visual hallucinations. She denies substance abuse. She offered no additional questions or  concerns. She was escorted back to group.

## 2017-07-04 ENCOUNTER — Other Ambulatory Visit (HOSPITAL_COMMUNITY): Payer: 59 | Admitting: Occupational Therapy

## 2017-07-04 ENCOUNTER — Other Ambulatory Visit (HOSPITAL_COMMUNITY): Payer: 59 | Admitting: Licensed Clinical Social Worker

## 2017-07-04 ENCOUNTER — Encounter (HOSPITAL_COMMUNITY): Payer: Self-pay | Admitting: Occupational Therapy

## 2017-07-04 DIAGNOSIS — F0789 Other personality and behavioral disorders due to known physiological condition: Secondary | ICD-10-CM

## 2017-07-04 DIAGNOSIS — F332 Major depressive disorder, recurrent severe without psychotic features: Secondary | ICD-10-CM

## 2017-07-04 DIAGNOSIS — F411 Generalized anxiety disorder: Secondary | ICD-10-CM

## 2017-07-04 DIAGNOSIS — F22 Delusional disorders: Secondary | ICD-10-CM

## 2017-07-04 DIAGNOSIS — R4589 Other symptoms and signs involving emotional state: Secondary | ICD-10-CM

## 2017-07-04 NOTE — Therapy (Signed)
[facility] PARTIAL HOSPITALIZATION PROGRAM [address] [place], [place], [address] Phone: [phone]   Fax:  [phone]  Occupational Therapy Treatment  Patient Details  Name: Nicole Reeves MRN: [id number] Date of Birth: 07-03-1985 Referring Provider: Hillery Jacks, NP   Encounter Date: 07/04/2017  OT End of Session - 07/04/17 1155    Visit Number  8    Number of Visits  10    Date for OT Re-Evaluation  07/12/17    Authorization Type  [employer]    OT Start Time  0915    OT Stop Time  1015    OT Time Calculation (min)  60 min    Activity Tolerance  Patient tolerated treatment well    Behavior During Therapy  [facility] for tasks assessed/performed       Past Medical History:  Diagnosis Date  . Colitis 11/2014   acute onset bloody diarrhea. C diff, O&P negative.   . Hepatitis C 04/2016  . Left sided ulcerative colitis (HCC)   . Polysubstance abuse (HCC) 02/24/2014   tox screen + for THC, Barbiturates, amphetimine.    Past Surgical History:  Procedure Laterality Date  . FLEXIBLE SIGMOIDOSCOPY N/A 12/10/2014   Procedure: FLEXIBLE SIGMOIDOSCOPY;  Surgeon: Napoleon Form, MD;  Location: [facility] ENDOSCOPY;  Service: Endoscopy;  Laterality: N/A;    There were no vitals filed for this visit.  Subjective Assessment - 07/04/17 1152    Currently in Pain?  No/denies         S: "My first day in this program I would not have been able to finish that self esteem worksheet, but today I can"  O: Pt education continued on improving accountability skills in correlations with achievable goal formation. Pt asked if "immediate goal" from last session achieved. Pt revisited definitions and basics of accountability, to openly discuss with group for added understanding. Goals worksheet continued with identifying importance of goals and to determine if pt living in line with goals. Leisure activities identified as an area of occupational deprivation with the group, ideas  for pt and group members openly discussed and brainstormed within the group. Additionally, self-esteem worksheet added to address low self-esteem's effects on accountability.  A: Pt actively engaged in OT group this date. Pt achieved immediate goal from last session. Education given on how to set achievable and appropriate goals, pt in understanding and with insight. Pt experienced difficulty participating in self-esteem work sheet this date, needing VC's and encouragement from OT and group to complete. After completing worksheet pt reported being a good listener is her best trait. P: Pt provided with education on improving accountability in relationship to goals and self esteem. OT will continue to follow up with communication skills for successful implementation into daily life                   OT Education - 07/04/17 1155    Education provided  Yes    Education Details  education given on further developing accoutability skills in relation to self esteem    Person(s) Educated  Patient    Methods  Explanation;Handout    Comprehension  Verbalized understanding       OT Short Term Goals - 06/18/17 1456      OT SHORT TERM GOAL #1   Title  Patient will be educated on strategies to improve psychosocial skills needed to participate fully in all daily, work, and leisure activities.    Time  3    Period  Weeks    Status  New    Target Date  07/12/17      OT SHORT TERM GOAL #2   Title  Patient will be educated on a HEP and independent with implementation of HEP.    Time  3    Period  Weeks    Status  New      OT SHORT TERM GOAL #3   Title  Patient will independently apply psychosocial skills and coping mechanisms to her daily activities in order to function independently.    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 Chronic hepatitis C without hepatic coma (HCC) 06/28/2016  . Vaccine counseling 06/28/2016  . Diarrhea   . Blood in stool   . Hyponatremia 12/07/2014  . Acute colitis 12/07/2014     Dalphine Handing, MSOT, OTR/L  [place] 07/04/2017, 12:01 PM  [facility] PARTIAL HOSPITALIZATION PROGRAM [address] [place], [place], [address] Phone: [phone]   Fax:  [phone]  Name: Jamyiah Labella MRN: [id number] Date of Birth: 05/04/1985

## 2017-07-05 ENCOUNTER — Other Ambulatory Visit (HOSPITAL_COMMUNITY): Payer: 59 | Admitting: Licensed Clinical Social Worker

## 2017-07-05 ENCOUNTER — Other Ambulatory Visit (HOSPITAL_COMMUNITY): Payer: 59 | Admitting: Psychiatry

## 2017-07-05 ENCOUNTER — Encounter (HOSPITAL_COMMUNITY): Payer: Self-pay | Admitting: Occupational Therapy

## 2017-07-05 DIAGNOSIS — F411 Generalized anxiety disorder: Secondary | ICD-10-CM

## 2017-07-05 DIAGNOSIS — F332 Major depressive disorder, recurrent severe without psychotic features: Secondary | ICD-10-CM

## 2017-07-05 DIAGNOSIS — R4589 Other symptoms and signs involving emotional state: Secondary | ICD-10-CM

## 2017-07-05 DIAGNOSIS — F0789 Other personality and behavioral disorders due to known physiological condition: Secondary | ICD-10-CM

## 2017-07-05 DIAGNOSIS — F22 Delusional disorders: Secondary | ICD-10-CM

## 2017-07-05 NOTE — Psych (Signed)
   [facility] BH PHP THERAPIST PROGRESS NOTE  Nicole Reeves [id number]  Session Time: 9:00 - 11:00  Participation Level: Active  Behavioral Response: CasualAlertAnxious and Depressed  Type of Therapy: Group Therapy, Psychotherapy  Treatment Goals addressed: Coping  Interventions: CBT, DBT, Solution Focused, Supportive and Reframing  Summary: Clinician led check-in regarding current stressors and situation, and review of patient completed daily inventory. Clinician utilized active listening and empathetic response and validated patient emotions. Clinician facilitated processing group on pertinent issues.   Therapist Response: Rosi Secrist is a 32 y.o. female who presents with anxiety and depression symptoms. Pt arrived within time allowed and reports she is feeling "pretty good." Pt rates her mood at a 7 on a scale of 1-10 with 10 being great. Pt shares she had took a nap and struggled with an old acquaintance. Pt processed through feelings about her responsibility to old friends. Pt reports wanting to work on how to manage negative thoughts.       Session Time: 11:00 -12:15   Participation Level: Active   Behavioral Response: CasualAlertDepressed   Type of Therapy: Group Therapy, OT   Treatment Goals addressed: Coping   Interventions: Psychosocial skills training, Supportive,    Summary:  Occupational Therapy group   Therapist Response: Patient engaged in group. See OT note.             Session Time: 12:15 - 1:00  Participation Level: Active  Behavioral Response: CasualAlertDepressed  Type of Therapy: Group Therapy, Activity Therapy  Treatment Goals addressed: Coping  Interventions: Psychologist, occupational, Supportive  Summary:  Reflection Group: Patients encouraged to practice skills and interpersonal techniques or work on mindfulness and relaxation techniques. The importance of self-care and making skills part of a routine to increase usage were stressed    Therapist Response: Patient engaged and participated.       Session Time: 12:45- 2:00  Participation Level: Active  Behavioral Response: CasualAlertDepressed  Type of Therapy: Group Therapy, Psychoeducation; Psychotherapy  Treatment Goals addressed: Coping  Interventions: CBT; Solution focused; Supportive; Reframing  Summary: 12:45 - 1:50: Clinician continued topic of cognitive distortions. Group reviewed cognitive distortion handout and worked to recognize examples from their own lives. 1:50 -2:00 Clinician led check-out. Clinician assessed for immediate needs, medication compliance and efficacy, and safety concerns   Therapist Response: Patient engaged activity and discussion. Pt accurately identified ways in which distortions present in their lives.  At check-out, patient rates her mood at a 6 on a scale of 1-10 with 10 being great. Patient reports plans of hanging out with a friend and having dinner with her partner. Patient demonstrates some progress as evidenced by appropriately handling a tricky communication. Patient denies SI/HI/self-harm at the end of group.     Suicidal/Homicidal: Nowithout intent/plan   Plan: Pt will continue in PHP to continue addressing depression and anxiety symptoms and increased ability to manage these symptoms.   Diagnosis: Generalized anxiety disorder [F41.1]    1. Generalized anxiety disorder   2. Severe episode of recurrent major depressive disorder, without psychotic features (HCC)       [name], LCSW, LCAS 07/05/2017
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
## 2017-07-05 NOTE — Psych (Signed)
   [facility] BH PHP THERAPIST PROGRESS NOTE  Nicole Reeves [id number]  Session Time: 9:00 - 11:00  Participation Level: Active  Behavioral Response: CasualAlertAnxious and Depressed  Type of Therapy: Group Therapy, Psychotherapy  Treatment Goals addressed: Coping  Interventions: CBT, DBT, Solution Focused, Supportive and Reframing  Summary: Clinician led check-in regarding current stressors and situation, and review of patient completed daily inventory. Clinician utilized active listening and empathetic response and validated patient emotions. Clinician facilitated processing group on pertinent issues.   Therapist Response: Nicole Reeves is a 32 y.o. female who presents with anxiety and depression symptoms. Pt arrived within time allowed and reports she is feeling "pretty okay." Pt rates her mood at a 6 on a scale of 1-10 with 10 being great. Pt shares she had a good weekend with her family, however felt overwhelmed by their energy. Pt reports wanting to work on how to have difficult conversations.       Session Time: 11:00 -12:15   Participation Level: Active   Behavioral Response: CasualAlertDepressed   Type of Therapy: Group Therapy, OT   Treatment Goals addressed: Coping   Interventions: Psychosocial skills training, Supportive,    Summary:  Occupational Therapy group   Therapist Response: Patient engaged in group. See OT note.        Session Time: 12:00 - 12:45  Participation Level: Active  Behavioral Response: CasualAlertDepressed  Type of Therapy: Group Therapy, Activity Therapy  Treatment Goals addressed: Coping  Interventions: Psychologist, occupational, Supportive  Summary:  Reflection Group: Patients encouraged to practice skills and interpersonal techniques or work on mindfulness and relaxation techniques. The importance of self-care and making skills part of a routine to increase usage were stressed   Therapist Response: Patient engaged and  participated appropriately.       Session Time: 12:45- 2:00  Participation Level: Active  Behavioral Response: CasualAlertDepressed  Type of Therapy: Group Therapy, Psychoeducation; Psychotherapy  Treatment Goals addressed: Coping  Interventions: CBT; Solution focused; Supportive; Reframing  Summary: 12:45 - 1:50: Clinician introduced topic of cognitive distortions. Cln educated on what cognitive distortions are and how they affect [place]. Cln introduced "Catch, Challenge, Change" and group reviewed cognitive distortion handout and came up with examples to work on "catch." 1:50 -2:00 Clinician led check-out. Clinician assessed for immediate needs, medication compliance and efficacy, and safety concerns   Therapist Response: Patient engaged activity and discussion. Pt was able to identify real life examples of cognitive distortions discussed.  At check-out, patient rates her mood at a 5 on a scale of 1-10 with 10 being great. Patient reports plans walking with her dog, watching a TV show, and utilize distractions.  Patient demonstrates some progress as evidenced by sharing ways she feels her reactions are improving. Patient denies SI/HI/self-harm at the end of group.    Suicidal/Homicidal: Nowithout intent/plan   Plan: Pt will continue in PHP to continue addressing depression and anxiety symptoms and increased ability to manage these symptoms.   Diagnosis: Generalized anxiety disorder [F41.1]    1. Generalized anxiety disorder   2. Severe episode of recurrent major depressive disorder, without psychotic features (HCC)       [name], LCSW, LCAS 07/05/2017

## 2017-07-05 NOTE — Progress Notes (Addendum)
GROUP NOTE - spiritual care group 07/03/2017 11:00 - 12:15 ?Facilitated by Chaplain Erhard Senske, MDiv      Group focused on topic of strength. ?Group members reflected on what thoughts and feelings emerge when they hear this topic. ?They then engaged in therapeutic art activity. ?Reflected on The topic of strength and represented what strength had been to them in their lives (images and patterns given) and what they saw as helpful in their life now. ?What they needed / wanted. ??Engaged in facilitated sharing of insights from activity.  Activity drew on narrative framework 

## 2017-07-05 NOTE — Therapy (Addendum)
[place], [place], [address] Phone: [phone]   Fax:  [phone]  Occupational Therapy Treatment  Patient Details  Name: Nicole Reeves MRN: [id number] Date of Birth: [birth year] Referring Provider: Ricky Ala, NP   Encounter Date: 07/05/2017  OT End of Session - 07/05/17 1258    Visit Number  9    Number of Visits  10    Date for OT Re-Evaluation  07/12/17    Authorization Type  [employer]    OT Start Time  1100    OT Stop Time  1215    OT Time Calculation (min)  75 min    Activity Tolerance  Patient tolerated treatment well    Behavior During Therapy  [facility] for tasks assessed/performed       Past Medical History:  Diagnosis Date  . Colitis 11/2014   acute onset bloody diarrhea. C diff, O&P negative.   . Hepatitis C 04/2016  . Left sided ulcerative colitis ([place])   . Polysubstance abuse ([place]) 02/24/2014   tox screen + for THC, Barbiturates, amphetimine.    Past Surgical History:  Procedure Laterality Date  . FLEXIBLE SIGMOIDOSCOPY N/A 12/10/2014   Procedure: FLEXIBLE SIGMOIDOSCOPY;  Surgeon: Mauri Pole, MD;  Location: [facility] ENDOSCOPY;  Service: Endoscopy;  Laterality: N/A;    There were no vitals filed for this visit.  Subjective Assessment - 07/05/17 1257    Currently in Pain?  No/denies        S: "I feel aware of what my body feels like when I am under stress"  O: Pt educated on stress management as a coping strategy for managing symptoms of diagnosis in daily life. Pt identified 3 personal stressors relating to personal life and shared with the group to identify 3 stressors in which the entire group can agree on (being work, mental health issues, and relationships), to gain insight. Pt completed stress evaluation questionnaire to identify stress level as low, moderate, high, and very high. Mindfulness music activity completed by pt listening to song with silence for 1 minute  to then identify personal thoughts and feelings in relation to the song. Deep breathing strategies and progressive muscle relaxation completed in the group with return demonstration. Negative coping strategies work sheet completed to gain insight and understand short term vs long term effects of negative coping patterns. Pt provided with supplementary handouts for carry over and practice into daily routines.  A: Pt actively engaged in OT treatment this date with discussion and providing examples. Pt identified personal stressors as work, relationships, and mental health issues. Pt exhibited "very high" stress score on questionnaire. Pt identified feelings of peacefulness and sadness when engaging in music activity, interest in continuing this practice. Pt completed deep breathing and PMR with return demonstration. In reference to negative coping mechanisms, pt identified passivity and verbal outbursts as her negative hoping strategies.  P: Pt provided with education on stress management activities. OT will continue to follow up with communication skills for successful implementation into daily life                    OT Education - 07/05/17 1257    Education provided  Yes    Education Details  education given on stress management    Person(s) Educated  Patient    Methods  Explanation;Demonstration;Handout    Comprehension  Verbalized understanding;Returned demonstration       OT Short Term Goals - 06/18/17 1456  OT SHORT TERM GOAL #1   Title  Patient will be educated on strategies to improve psychosocial skills needed to participate fully in all daily, work, and leisure activities.    Time  3    Period  Weeks    Status  New    Target Date  07/12/17      OT SHORT TERM GOAL #2   Title  Patient will be educated on a HEP and independent with implementation of HEP.    Time  3    Period  Weeks    Status  New      OT SHORT TERM GOAL #3   Title  Patient will independently  apply psychosocial skills and coping mechanisms to her daily activities in order to function independently.    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 Chronic hepatitis C without hepatic coma ([place]) 06/28/2016  . Vaccine counseling 06/28/2016  . Diarrhea   . Blood in stool   . Hyponatremia 12/07/2014  . Acute colitis 12/07/2014   OCCUPATIONAL THERAPY DISCHARGE SUMMARY  Visits from Start of Care: 9  Current functional level related to goals / functional outcomes: Independent- pt has made significant improvement in management of diagnosis and increased insight and awareness to use coping skills and when   Remaining deficits: n/a   Education / Equipment: Coping skills and psychosocial skills to implement into daily life for improved quality of life when managing diagnosis Plan: Patient agrees to discharge.  Patient goals were met. Patient is being discharged due to meeting the stated rehab goals.  ?????         [name], MSOT, OTR/L   [place] 07/05/2017, 1:00 PM  [facility] PARTIAL HOSPITALIZATION PROGRAM [address], [place], [address] Phone:  [phone]   Fax:  [phone]  Name: Nicole Reeves MRN: [id number] Date of Birth: [birth year]

## 2017-07-08 ENCOUNTER — Other Ambulatory Visit (HOSPITAL_COMMUNITY): Payer: Self-pay

## 2017-07-08 ENCOUNTER — Ambulatory Visit (HOSPITAL_COMMUNITY): Payer: Self-pay

## 2017-07-08 NOTE — Psych (Signed)
   [facility] BH PHP THERAPIST PROGRESS NOTE  Nicole Reeves [id number]  Session Time: 9:00 - 10:45  Participation Level: Active  Behavioral Response: CasualAlertAnxious and Depressed  Type of Therapy: Group Therapy, Psychotherapy  Treatment Goals addressed: Coping  Interventions: CBT, DBT, Solution Focused, Supportive and Reframing  Summary: Clinician led check-in regarding current stressors and situation, and review of patient completed daily inventory. Clinician utilized active listening and empathetic response and validated patient emotions. Clinician facilitated processing group on pertinent issues.   Therapist Response: Demetress Tift is a 31 y.o. female who presents with anxiety and depression symptoms. Pt arrived within time allowed and reports she is feeling "fidgety." Pt rates her mood at a 6 on a scale of 1-10 with 10 being great. Pt shares her plans were canceled yesterday and so she "just hung out."  Pt reports wanting to work on how to start saying no.       Session Time: 10:45 -12:15  Participation Level: Active  Behavioral Response: CasualAlertDepressed  Type of Therapy: Group Therapy, psychotherapy  Treatment Goals addressed: Coping  Interventions: Strengths based, reframing, Supportive,   Summary:  Spiritual Care group  Therapist Response: Patient engaged in group. See chaplain note.          Session Time: 12:15 - 1:00  Participation Level: Active  Behavioral Response: CasualAlertDepressed  Type of Therapy: Group Therapy, Activity Therapy  Treatment Goals addressed: Coping  Interventions: Psychologist, occupational, Supportive  Summary:  Reflection Group: Patients encouraged to practice skills and interpersonal techniques or work on mindfulness and relaxation techniques. The importance of self-care and making skills part of a routine to increase usage were stressed   Therapist Response: Patient engaged and participated appropriately.       Session Time: 1:00- 2:00  Participation Level: Active  Behavioral Response: CasualAlertDepressed  Type of Therapy: Group Therapy, Psychoeducation, Activity therapy  Treatment Goals addressed: Coping  Interventions: relaxation training; Supportive; Reframing  Summary: 12:45 - 1:50: Relaxation group: Cln led group focused on retraining the body's response to stress.   1:50 -2:00 Clinician led check-out. Clinician assessed for immediate needs, medication compliance and efficacy, and safety concerns   Therapist Response: Patient engaged activity and discussion. At check-out, patient rates her mood at a 7 on a scale of 1-10 with 10 being great. Patient reports plans of going to get groceries and have dinner out. Patient demonstrates some progress as evidenced by increased awareness of limits she needs to set. Patient denies SI/HI/self-harm thoughts at the end of group.     Suicidal/Homicidal: Nowithout intent/plan   Plan: Pt will continue in PHP to continue addressing depression and anxiety symptoms and increased ability to manage these symptoms.   Diagnosis: Generalized anxiety disorder [F41.1]    1. Generalized anxiety disorder   2. Severe episode of recurrent major depressive disorder, without psychotic features [facility])       [name], LCSW, LCAS 07/08/2017

## 2017-07-09 ENCOUNTER — Other Ambulatory Visit (HOSPITAL_COMMUNITY): Payer: Self-pay

## 2017-07-09 NOTE — Psych (Signed)
   [facility] BH PHP THERAPIST PROGRESS NOTE  Nicole Reeves [id number]   Session Time: 9:00 -10:30   Participation Level: Active   Behavioral Response: CasualAlertDepressed   Type of Therapy: Group Therapy, OT   Treatment Goals addressed: Coping   Interventions: Psychosocial skills training, Supportive,    Summary:  Occupational Therapy group   Therapist Response: Patient engaged in group. See OT note.       Session Time: 10:30 - 12:00  Participation Level: Active  Behavioral Response: CasualAlertAnxious and Depressed  Type of Therapy: Group Therapy, Psychotherapy  Treatment Goals addressed: Coping  Interventions: CBT, DBT, Solution Focused, Supportive and Reframing  Summary: Clinician led check-in regarding current stressors and situation, and review of patient completed daily inventory. Clinician utilized active listening and empathetic response and validated patient emotions. Clinician facilitated processing group on pertinent issues.   Therapist Response: Nicole Reeves is a 32 y.o. female who presents with anxiety and depression symptoms. Pt arrived within time allowed and reports she is feeling "obsessive." Pt rates her mood at a 5 on a scale of 1-10 with 10 being great. Pt reports spending the evening watching tv and was happy because she has not had the interest to do so in a while. Pt shares she has left the house early this morning to drive around and listen to music which helped clear her head. Pt reports wanting to work on anxiety about returning to work.       Session Time: 12:00 - 12:45  Participation Level: Active  Behavioral Response: CasualAlertDepressed  Type of Therapy: Group Therapy, Activity Therapy  Treatment Goals addressed: Coping  Interventions: Psychologist, occupational, Supportive  Summary:  Reflection Group: Patients encouraged to practice skills and interpersonal techniques or work on mindfulness and relaxation techniques. The  importance of self-care and making skills part of a routine to increase usage were stressed   Therapist Response: Patient engaged and participated appropriately.       Session Time: 12:45- 2:00  Participation Level: Active  Behavioral Response: CasualAlertDepressed  Type of Therapy: Group Therapy, Psychoeducation; Psychotherapy  Treatment Goals addressed: Coping  Interventions: CBT; Solution focused; Supportive; Reframing  Summary: 12:45 - 1:50: Cln continued topic of cognitive distortions. Group discussed how to "challenge" the unhealthy thought patterns once recognized. Group reviewed "Challenges to Negative Thinking" handout and went over handout with irrational thought examples and discussed how to challenge those thoughts. 1:50 -2:00 Clinician led check-out. Clinician assessed for immediate needs, medication compliance and efficacy, and safety concerns   Therapist Response: Patient engaged in activity. Pt states understanding of how to challenge unhealthy thoughts and successfully reframed examples in discussion. At check-out, patient rates her mood at a 6 on a scale of 1-10 with 10 being great. Patient reports plans of talking to a potential sponsee, going to a meeting, and going to bed early.  Patient demonstrates some progress as evidenced by reporting a decrease in anhedonia. Patient denies SI/HI/self-harm at the end of group.     Suicidal/Homicidal: Nowithout intent/plan   Plan: Pt will continue in PHP to continue addressing depression and anxiety symptoms and increased ability to manage these symptoms.   Diagnosis: Generalized anxiety disorder [F41.1]    1. Generalized anxiety disorder   2. Severe episode of recurrent major depressive disorder, without psychotic features [facility])       [name], LCSW, LCAS 07/09/2017

## 2017-07-10 ENCOUNTER — Other Ambulatory Visit (HOSPITAL_COMMUNITY): Payer: Self-pay

## 2017-07-10 NOTE — Psych (Signed)
[facility] BH PHP THERAPIST PROGRESS NOTE  Nicole Reeves [id number]  Session Time: 9:00 - 10:15  Participation Level: Active  Behavioral Response: CasualAlertAnxious and Depressed  Type of Therapy: Group Therapy, Psychotherapy  Treatment Goals addressed: Coping  Interventions: CBT, DBT, Solution Focused, Supportive and Reframing  Summary: Clinician led check-in regarding current stressors and situation, and review of patient completed daily inventory. Clinician utilized active listening and empathetic response and validated patient emotions. Clinician facilitated processing group on pertinent issues.   Therapist Response: Nicole Reeves is a 32 y.o. female who presents with anxiety and depression symptoms. Pt arrived within time allowed and reports she is "feeling good." Pt rates her mood at a 7 on a scale of 1-10 with 10 being great. Pt shares she is feeling nervous "but prepared" for discharge today. Pt shares she was assertive with a potential sponsee and felt good about taking care of herself in that way.  Pt reports wanting to work on her intro back to work.       Session Time: 10:15 -11:00  Participation Level: Active  Behavioral Response: CasualAlertDepressed  Type of Therapy: Group Therapy, psychoeducation, psychotherapy  Treatment Goals addressed: Coping  Interventions: CBT, DBT, Solution Focused, Supportive and Reframing  Summary:  Clinician led discussion on upcoming holiday of Mother's Day and allowed pt's space to discuss what this holiday will mean to them.    Therapist Response: Patient engaged and participated in discussion. Pt reports no internal conflicts about the holiday and provides support to other group members.       Session Time: 11:00 -12:00   Participation Level: Active   Behavioral Response: CasualAlertDepressed   Type of Therapy: Group Therapy, OT   Treatment Goals addressed: Coping   Interventions: Psychosocial skills training,  Supportive,    Summary:  Occupational Therapy group   Therapist Response: Patient engaged in group. See OT note.        Session Time: 12:00- 1:00  Participation Level: Active  Behavioral Response: CasualAlertDepressed  Type of Therapy: Group Therapy, Psychoeducation; Psychotherapy  Treatment Goals addressed: Coping  Interventions: CBT; Solution focused; Supportive; Reframing  Summary: 12:45 - 1:50: Clinician introduced topic of "Positive Psychology." Group watched "The Happiness Advantage" TED talk and discussed how the "lens" through which they view life affects the way they feel. Pts identified a strategy they would be willing to try to change their "lens."  1:50 -2:00 Clinician led check-out. Clinician assessed for immediate needs, medication compliance and efficacy, and safety concerns  Therapist Response:  Pt engaged in discussion regarding ways to train your mind to scan for the positive. Pt reports willingness to try daily gratitudes as a way to practice.   At check-out, patient rates her mood at a 7 on a scale of 1-10 with 10 being great. Patient reports plans of celebrating mother's day with her partner this weekend.  Patient demonstrates progress as evidenced by increased ability to self manage symptoms and reporting increased positivity about life. Patient denies SI/HI/self-harm at the end of group.    Suicidal/Homicidal: Nowithout intent/plan   Plan: Pt will discharge from PHP due to meeting treatment goals of decreased anxiety and depression symptoms, increased ability to self manage symptoms, and increased brightness. Pt progress noted by observation, self report, and rating scales. Pt and provider are aligned with discharge plan. Pt has declined IOP due to wanting to return to work. Pt will follow up with individual therapy and psychiatry as well as OP therapy group 1x/week. Pt has appointment with  Nicole Reeves for therapy, and Dr Nicole Reeves for psychiatry within this  office on 07/25/17. Pt will begin OP therapy group with cln Nicole Reeves on 07/17/17. Pt denies all SI/HI/psychosis at time of discharge.    Diagnosis: Generalized anxiety disorder [F41.1]    1. Generalized anxiety disorder   2. Severe episode of recurrent major depressive disorder, without psychotic features [facility])       [name], LCSW, LCAS 07/10/2017

## 2017-07-11 ENCOUNTER — Ambulatory Visit (HOSPITAL_COMMUNITY): Payer: Self-pay

## 2017-07-11 ENCOUNTER — Other Ambulatory Visit (HOSPITAL_COMMUNITY): Payer: Self-pay

## 2017-07-12 ENCOUNTER — Ambulatory Visit (HOSPITAL_COMMUNITY): Payer: Self-pay

## 2017-07-12 ENCOUNTER — Other Ambulatory Visit (HOSPITAL_COMMUNITY): Payer: Self-pay | Admitting: Family

## 2017-07-12 ENCOUNTER — Other Ambulatory Visit (HOSPITAL_COMMUNITY): Payer: Self-pay

## 2017-07-15 ENCOUNTER — Other Ambulatory Visit (HOSPITAL_COMMUNITY): Payer: Self-pay

## 2017-07-15 ENCOUNTER — Ambulatory Visit (HOSPITAL_COMMUNITY): Payer: Self-pay

## 2017-07-16 ENCOUNTER — Ambulatory Visit (HOSPITAL_COMMUNITY): Payer: Self-pay

## 2017-07-16 ENCOUNTER — Other Ambulatory Visit (HOSPITAL_COMMUNITY): Payer: Self-pay

## 2017-07-17 ENCOUNTER — Other Ambulatory Visit (HOSPITAL_COMMUNITY): Payer: Self-pay

## 2017-07-18 ENCOUNTER — Other Ambulatory Visit (HOSPITAL_COMMUNITY): Payer: Self-pay

## 2017-07-18 ENCOUNTER — Ambulatory Visit (HOSPITAL_COMMUNITY): Payer: Self-pay

## 2017-07-19 ENCOUNTER — Ambulatory Visit (HOSPITAL_COMMUNITY): Payer: Self-pay

## 2017-07-19 ENCOUNTER — Other Ambulatory Visit (HOSPITAL_COMMUNITY): Payer: Self-pay

## 2017-07-22 ENCOUNTER — Ambulatory Visit (HOSPITAL_COMMUNITY): Payer: Self-pay

## 2017-07-23 ENCOUNTER — Ambulatory Visit (HOSPITAL_COMMUNITY): Payer: Self-pay

## 2017-07-25 ENCOUNTER — Encounter (HOSPITAL_COMMUNITY): Payer: Self-pay | Admitting: Psychiatry

## 2017-07-25 ENCOUNTER — Ambulatory Visit (HOSPITAL_COMMUNITY): Payer: Self-pay

## 2017-07-25 ENCOUNTER — Ambulatory Visit (INDEPENDENT_AMBULATORY_CARE_PROVIDER_SITE_OTHER): Payer: 59 | Admitting: Psychiatry

## 2017-07-25 DIAGNOSIS — F411 Generalized anxiety disorder: Secondary | ICD-10-CM | POA: Diagnosis not present

## 2017-07-25 DIAGNOSIS — F332 Major depressive disorder, recurrent severe without psychotic features: Secondary | ICD-10-CM

## 2017-07-25 MED ORDER — HYDROXYZINE HCL 25 MG PO TABS: 25.0000 mg | ORAL_TABLET | Freq: Three times a day (TID) | ORAL | 0 refills | Status: DC | PRN

## 2017-07-25 MED ORDER — CITALOPRAM HYDROBROMIDE 40 MG PO TABS: 40.0000 mg | ORAL_TABLET | Freq: Every day | ORAL | 0 refills | Status: DC

## 2017-07-25 MED ORDER — MIRTAZAPINE 15 MG PO TABS: 15.0000 mg | ORAL_TABLET | Freq: Every day | ORAL | 0 refills | Status: DC

## 2017-07-25 NOTE — Patient Instructions (Addendum)
Increase celexa to 40 mg tablet every morning  Take abilify in the mornings  Continue trileptal at night  START Remeron 15 mg at night  STOP Gabapentin  Decrease Zonegran to 100 mg nightly for 1 week, then 50 mg nightly for 1 week, then STOP
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
## 2017-07-26 ENCOUNTER — Ambulatory Visit (HOSPITAL_COMMUNITY): Payer: Self-pay

## 2017-08-06 ENCOUNTER — Ambulatory Visit (HOSPITAL_COMMUNITY): Payer: Self-pay | Admitting: Licensed Clinical Social Worker

## 2017-08-07 ENCOUNTER — Other Ambulatory Visit: Payer: Self-pay | Admitting: Gastroenterology

## 2017-08-09 ENCOUNTER — Other Ambulatory Visit: Payer: Self-pay | Admitting: Gastroenterology

## 2017-08-13 ENCOUNTER — Ambulatory Visit (HOSPITAL_COMMUNITY): Payer: Self-pay | Admitting: Licensed Clinical Social Worker

## 2017-08-14 ENCOUNTER — Ambulatory Visit (INDEPENDENT_AMBULATORY_CARE_PROVIDER_SITE_OTHER): Payer: 59 | Admitting: Psychiatry

## 2017-08-14 ENCOUNTER — Encounter (HOSPITAL_COMMUNITY): Payer: Self-pay | Admitting: Psychiatry

## 2017-08-14 VITALS — BP 114/78 | HR 87 | Ht 67.0 in | Wt 170.0 lb

## 2017-08-14 DIAGNOSIS — F411 Generalized anxiety disorder: Secondary | ICD-10-CM

## 2017-08-14 DIAGNOSIS — F332 Major depressive disorder, recurrent severe without psychotic features: Secondary | ICD-10-CM | POA: Diagnosis not present

## 2017-08-14 MED ORDER — ARIPIPRAZOLE 5 MG PO TABS: 5.0000 mg | ORAL_TABLET | ORAL | 0 refills | Status: DC

## 2017-08-14 MED ORDER — OXCARBAZEPINE 600 MG PO TABS: 900.0000 mg | ORAL_TABLET | Freq: Every day | ORAL | 0 refills | Status: DC

## 2017-08-14 MED ORDER — CITALOPRAM HYDROBROMIDE 40 MG PO TABS: 40.0000 mg | ORAL_TABLET | Freq: Every day | ORAL | 0 refills | Status: DC

## 2017-08-14 MED ORDER — MIRTAZAPINE 15 MG PO TABS: 15.0000 mg | ORAL_TABLET | Freq: Every day | ORAL | 0 refills | Status: DC

## 2017-08-14 NOTE — Progress Notes (Signed)
[facility] MD/PA/NP OP Progress Note  08/14/2017 1:34 PM Nicole Reeves  MRN:  [id number]  Chief Complaint: med check HPI: Nicole Reeves presents with significantly improved mood, sleep, and feeling more encouraged.  She reports that the combination of Celexa, Remeron, Abilify seems to agree with her mood.  We agreed to keep the Trileptal as prescribed given her issue with chronic migraines, and Trileptal has been effective and substantially reducing this.  She denies any safety concerns.  I spent time with her reviewing the long-term effects of Abilify and concerns about EPS and TD, and metabolic issues.  We agreed to reassess in 2-3 months and consider reduction to 2 mg.  Spent time discussing her relationship, and things seem to be improving in terms of communication and they are actively looking for a couples therapist to work with.    Visit Diagnosis:    ICD-10-CM   1. Generalized anxiety disorder F41.1 ARIPiprazole (ABILIFY) 5 MG tablet    citalopram (CELEXA) 40 MG tablet    mirtazapine (REMERON) 15 MG tablet    oxcarbazepine (TRILEPTAL) 600 MG tablet  2. Severe episode of recurrent major depressive disorder, without psychotic features ([facility]) F33.2 ARIPiprazole (ABILIFY) 5 MG tablet    citalopram (CELEXA) 40 MG tablet    mirtazapine (REMERON) 15 MG tablet    oxcarbazepine (TRILEPTAL) 600 MG tablet    Past Psychiatric History: See intake H&P for full details. Reviewed, with no updates at this time.   Past Medical History:  Past Medical History:  Diagnosis Date  . Colitis 11/2014   acute onset bloody diarrhea. C diff, O&P negative.   . Hepatitis C 04/2016  . Left sided ulcerative colitis ([place])   . Polysubstance abuse ([place]) 02/24/2014   tox screen + for THC, Barbiturates, amphetimine.    Past Surgical History:  Procedure Laterality Date  . FLEXIBLE SIGMOIDOSCOPY N/A 12/10/2014   Procedure: FLEXIBLE SIGMOIDOSCOPY;  Surgeon: Mauri Pole, MD;  Location: [facility];   Service: Endoscopy;  Laterality: N/A;    Family Psychiatric History: See intake H&P for full details. Reviewed, with no updates at this time.   Family History:  Family History  Problem Relation Age of Onset  . ADD / ADHD Sister   . Anxiety disorder Sister   . Drug abuse Paternal Aunt   . Alcohol abuse Maternal Grandfather   . Alcohol abuse Maternal Grandmother   . Alcohol abuse Paternal Grandfather   . Alcohol abuse Paternal Grandmother   . Anxiety disorder Paternal Grandmother   . Drug abuse Paternal Grandmother   . Colon cancer Neg Hx   . Esophageal cancer Neg Hx   . Rectal cancer Neg Hx   . Stomach cancer Neg Hx     Social History:  Social History   Socioeconomic History  . Marital status: Single    Spouse name: Not on file  . Number of children: 0  . Years of education: Not on file  . Highest education level: Not on file  Occupational History  . Occupation: [employer]   Social Needs  . Financial resource strain: Not hard at all  . Food insecurity:    Worry: Never true    Inability: Never true  . Transportation needs:    Medical: No    Non-medical: No  Tobacco Use  . Smoking status: Former Smoker    Packs/day: 0.25    Types: Cigarettes, E-cigarettes  . Smokeless tobacco: Never Used  . Tobacco comment: Reports desire to quite e-cigarettes   Substance and  Sexual Activity  . Alcohol use: No    Alcohol/week: 0.0 oz  . Drug use: No    Types: Marijuana    Comment: not used since 02/2014  . Sexual activity: Yes    Partners: Female  Lifestyle  . Physical activity:    Days per week: 0 days    Minutes per session: Not on file  . Stress: To some extent  Relationships  . Social connections:    Talks on phone: More than three times a week    Gets together: Never    Attends religious service: More than 4 times per year    Active member of club or organization: No    Attends meetings of clubs or organizations: Never    Relationship status: Living with partner  Other  Topics Concern  . Not on file  Social History Narrative  . Not on file    Allergies: No Known Allergies  Metabolic Disorder Labs: Lab Results  Component Value Date   HGBA1C 5.1 12/08/2014   MPG 100 12/08/2014   No results found for: PROLACTIN No results found for: CHOL, TRIG, HDL, CHOLHDL, VLDL, LDLCALC Lab Results  Component Value Date   TSH 1.264 03/22/2015   TSH 4.967 (H) 12/08/2014    Therapeutic Level Labs: No results found for: LITHIUM No results found for: VALPROATE No components found for:  CBMZ  Current Medications: Current Outpatient Medications  Medication Sig Dispense Refill  . APRISO 0.375 g 24 hr capsule TAKE 4 CAPSULES (1.5 G TOTAL) BY MOUTH DAILY. 360 capsule 0  . ARIPiprazole (ABILIFY) 5 MG tablet Take 1 tablet (5 mg total) by mouth every morning. 90 tablet 0  . citalopram (CELEXA) 40 MG tablet Take 1 tablet (40 mg total) by mouth daily. 90 tablet 0  . mirtazapine (REMERON) 15 MG tablet Take 1 tablet (15 mg total) by mouth at bedtime. 90 tablet 0  . Multiple Vitamin (MULTIVITAMIN WITH MINERALS) TABS tablet Take 1 tablet by mouth daily. 30 tablet 0  . oxcarbazepine (TRILEPTAL) 600 MG tablet Take 1.5 tablets (900 mg total) by mouth at bedtime. 135 tablet 0  . hydrOXYzine (ATARAX/VISTARIL) 25 MG tablet Take 1 tablet (25 mg total) by mouth 3 (three) times daily as needed for anxiety. (Patient not taking: Reported on 08/14/2017) 90 tablet 0   No current facility-administered medications for this visit.      Musculoskeletal: Strength & Muscle Tone: within normal limits Gait & Station: normal Patient leans: N/A  Psychiatric Specialty Exam: ROS  Blood pressure 114/78, pulse 87, height 5' 7"  (1.702 m), weight 170 lb (77.1 kg), SpO2 97 %.Body mass index is 26.63 kg/m.  General Appearance: Casual and Well Groomed  Eye Contact:  Good  Speech:  Clear and Coherent and Normal Rate  Volume:  Normal  Mood:  Euthymic  Affect:  Appropriate and Congruent  Thought  Process:  Goal Directed and Descriptions of Associations: Intact  Orientation:  Full (Time, Place, and Person)  Thought Content: Logical   Suicidal Thoughts:  No  Homicidal Thoughts:  No  Memory:  Immediate;   Good  Judgement:  Fair  Insight:  Fair  Psychomotor Activity:  Normal  Concentration:  Concentration: Good  Recall:  Good  Fund of Knowledge: Good  Language: Good  Akathisia:  Negative  Handed:  Right  AIMS (if indicated): not done  Assets:  Communication Skills Desire for Improvement Financial Resources/Insurance Housing  ADL's:  Intact  Cognition: WNL  Sleep:  Good   Screenings:  GAD-7     Counselor from 07/05/2017 in [place] Counselor from 06/17/2017 in [place]  Total GAD-7 Score  5  19    PHQ2-9     Counselor from 07/05/2017 in [place] Most recent reading at 07/05/2017  9:00 AM Counselor from 06/14/2017 in [place] Most recent reading at 06/14/2017 11:35 AM Counselor from 06/17/2017 in [place] Most recent reading at 06/14/2017  9:00 AM Office Visit from 06/28/2016 in [facility] for [facility]ious Disease Most recent reading at 06/28/2016  2:30 PM  PHQ-2 Total Score  1  6  6  2   PHQ-9 Total Score  6  22  24  6        Assessment and Plan:  Iman Reinertsen presents with improving mood, energy, sleep.  She tolerated the taper and discontinuation of Zonegran and gabapentin.  She has been taking Celexa and Abilify in the morning, and Remeron and Trileptal at night.  She notes that she is sleeping much better and she feels like she is getting back to being her old self in terms of positive mood, optimism, and euthymia.   No acute safety issues, we will follow-up in 2-3 months or sooner if needed, and she will continue to work with Gwynneth Macleod for individual therapy.  1.  Generalized anxiety disorder   2. Severe episode of recurrent major depressive disorder, without psychotic features ([place])     Status of current problems: gradually improving  Labs Ordered: No orders of the defined types were placed in this encounter.   Labs Reviewed: n/a  Collateral Obtained/Records Reviewed: n/a  Plan:  Continue Celexa 40 mg daily Continue Abilify 5 mg daily; consider reduction to 2 mg at follow-up Continue Trileptal 900 mg nightly Continue Remeron 15 mg nightly Hydroxyzine available for anxiety if needed rtc 2-3 months  Aundra Dubin, MD 08/14/2017, 1:34 PM

## 2017-08-20 ENCOUNTER — Ambulatory Visit (HOSPITAL_COMMUNITY): Payer: Self-pay | Admitting: Licensed Clinical Social Worker

## 2017-09-10 ENCOUNTER — Telehealth (HOSPITAL_COMMUNITY): Payer: Self-pay

## 2017-09-10 NOTE — Telephone Encounter (Signed)
She can decrease to 600 mg for a week, then 300 mg for a week, then stop, and update us along the way if any issues arise.

## 2017-09-10 NOTE — Telephone Encounter (Signed)
Patient is calling to see if she can come off of the Trileptal - she feels like it is making her too tired. She needs to know if she should taper off or just stop. Please review and advise, thank you

## 2017-09-17 ENCOUNTER — Ambulatory Visit (HOSPITAL_COMMUNITY): Payer: Self-pay | Admitting: Licensed Clinical Social Worker

## 2017-10-21 ENCOUNTER — Telehealth (HOSPITAL_COMMUNITY): Payer: Self-pay

## 2017-10-21 NOTE — Telephone Encounter (Signed)
Patient called, she does not feel that her antidepressant is working. Patient states she is very depressed and having a hard time even getting out of the bed. She wants to know if she can change her medication. Please review and advise, thank you

## 2017-10-22 NOTE — Telephone Encounter (Signed)
We will discuss at her follow up next week.

## 2017-11-08 ENCOUNTER — Ambulatory Visit: Payer: Managed Care, Other (non HMO) | Admitting: Nurse Practitioner

## 2017-11-08 ENCOUNTER — Encounter: Payer: Self-pay | Admitting: Nurse Practitioner

## 2017-11-08 VITALS — BP 100/72 | HR 72 | Ht 67.0 in | Wt 189.4 lb

## 2017-11-08 DIAGNOSIS — K51919 Ulcerative colitis, unspecified with unspecified complications: Secondary | ICD-10-CM

## 2017-11-08 DIAGNOSIS — R635 Abnormal weight gain: Secondary | ICD-10-CM | POA: Diagnosis not present

## 2017-11-08 MED ORDER — NA SULFATE-K SULFATE-MG SULF 17.5-3.13-1.6 GM/177ML PO SOLN: ORAL | 0 refills | Status: DC

## 2017-11-08 NOTE — Patient Instructions (Signed)
If you are age 31 or older, your body mass index should be between 23-30. Your Body mass index is 29.66 kg/m. If this is out of the aforementioned range listed, please consider follow up with your Primary Care Provider.  If you are age 31 or younger, your body mass index should be between 19-25. Your Body mass index is 29.66 kg/m. If this is out of the aformentioned range listed, please consider follow up with your Primary Care Provider.   You have been scheduled for a colonoscopy. Please follow written instructions given to you at your visit today.  Please pick up your prep supplies at the pharmacy within the next 1-3 days. If you use inhalers (even only as needed), please bring them with you on the day of your procedure. Your physician has requested that you go to www.startemmi.com and enter the access code given to you at your visit today. This web site gives a general overview about your procedure. However, you should still follow specific instructions given to you by our office regarding your preparation for the procedure.  We have sent the following medications to your pharmacy for you to pick up at your convenience: Suprep  You may have a light breakfast the morning of prep day (the day before the procedure).   You may choose from one of the following items: eggs and toast OR chicken noodle soup and crackers.   You should have your breakfast completed between 8:00 and 9:00 am the day before your procedure.  After you have had your light breakfast you should start a clear liquid diet only, NO SOLIDS. No additional solid food is allowed. You may continue to have clear liquid up to 3 hours prior to your procedure.    Your provider has requested that you go to the basement level for lab work before leaving today. Press "B" on the elevator. The lab is located at the first door on the left as you exit the elevator.  Thank you for choosing me and [facility].   Willette ClusterPaula Guenther,  NP

## 2017-11-08 NOTE — Progress Notes (Signed)
Primary GI:  Harl Bowie, MD  Chief Complaint:    Follow up on IBD  IMPRESSION and PLAN:     93. 32 year old female with left sided UC diagnosed in 2016. Oon Apriso 1.5 grams /daily. 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 Now here to get rescheduled. Skin lesions have resolved. Except for transient IBD flare type symptoms a few weeks ago she is doing fairly well. Averaging 3 loose, non-bloody BMs a day.  -I will reschedule her for colonoscopy for restaging since she did present with the erythema nodosum last year. The risks and benefits of colonoscopy with possible polypectomy were discussed and the patient agrees to proceed.  -continue Apriso 1.5 grams daily  2. Excessive weight gain. She has gained 40- 50 pounds in a year.  Attributes weight gain to initiation of anti-depressants. Remeron most definitely can be associated with weight gain.  -check TSH  3. Nausea. She thinks nausea may also be due, at least in part, to anti-depressants. Per patient, no chance of pregnancy ,she is lesbian.    4. Hx of HCV, s/p treatment with cure 2018. Ultrasound elastrography Metavir fibrosis score is F0/F1   HPI:     Patient is a 32 year old female with remote hx of polysubstance abuse, HCV s/p treatment with cure, depression / anxiety, and severe left-sided colitis on flexible sigmoidoscopy in 2016.  Subsequently underwent full colonoscopy January 2017 with findings of left sided colitis, biopsies c/w chronic active colitis. She was treated with Delzicol.   Colonoscopy Jan 2017 ENDOSCOPIC IMPRESSION: active colitis with aphthous ulcers in sigmoid and descending colon RECOMMENDATIONS: 1. Await biopsy results 2. Follow-up in office visit in 1-2 weeks 3. Start Asacol and based on biopsy results will have to consider starting 6MP or Biologics   [name] was last seen in May 2018 when she presented with  new lower extremity skin lesions concerning for erythema nodosum. We wanted to repeat her colonoscopy for restaging. Procedure was scheduled but patient had to cancel because she couldn't get off work. She comes back now to get rescheduled for the procedure, her work situation has changed. The lesions on her legs healed, she hasn't had any more to occur. Recently had some IBD flare type symptoms with diarrhea but sx spontaneously resolved. She is currently averaging about 3 loose bowel, non-bloody BMs movements a day.   No abdominal pain.  No arthalgias.    Review of systems:     No chest pain, no SOB, no fevers, no urinary sx   Past Medical History:  Diagnosis Date  . Colitis 11/2014   acute onset bloody diarrhea. C diff, O&P negative.   . Hepatitis C 04/2016  . Left sided ulcerative colitis ([place])   . Polysubstance abuse ([place]) 02/24/2014   tox screen + for THC, Barbiturates, amphetimine.    Patient's surgical history, family medical history, social history, medications and allergies were all reviewed in Epic   Creatinine clearance cannot be calculated (Patient's most recent lab result is older than the maximum 21 days allowed.)  Current Outpatient Medications  Medication Sig Dispense Refill  . APRISO 0.375 g 24 hr capsule TAKE 4 CAPSULES (1.5 G TOTAL) BY MOUTH DAILY. 360 capsule 0  . citalopram (CELEXA) 40 MG tablet Take 1 tablet (40 mg total) by mouth daily. 90 tablet 0  . hydrOXYzine (ATARAX/VISTARIL) 25 MG tablet Take 1 tablet (25 mg total) by mouth  3 (three) times daily as needed for anxiety. 90 tablet 0  . Multiple Vitamin (MULTIVITAMIN WITH MINERALS) TABS tablet Take 1 tablet by mouth daily. 30 tablet 0  . oxcarbazepine (TRILEPTAL) 600 MG tablet Take 1.5 tablets (900 mg total) by mouth at bedtime. 135 tablet 0  . mirtazapine (REMERON) 15 MG tablet Take 1 tablet (15 mg total) by mouth at bedtime. (Patient not taking: Reported on 11/08/2017) 90 tablet 0   No current  facility-administered medications for this visit.     Physical Exam:     BP 100/72   Pulse 72   Ht 5' 7"  (1.702 m)   Wt 189 lb 6.4 oz (85.9 kg)   LMP 11/07/2017   BMI 29.66 kg/m   GENERAL:  Pleasant female in NAD PSYCH: : Cooperative, normal affect EENT:  conjunctiva pink, mucous membranes moist, neck supple without masses CARDIAC:  RRR, no murmur heard, no peripheral edema PULM: Normal respiratory effort, lungs CTA bilaterally, no wheezing ABDOMEN:  Nondistended, soft, nontender. No obvious masses, no hepatomegaly,  normal bowel sounds SKIN:  turgor, no lesions seen Musculoskeletal:  Normal muscle tone, normal strength NEURO: Alert and oriented x 3, no focal neurologic deficits   Tye Savoy , NP 11/08/2017, 2:51 PM

## 2017-11-11 ENCOUNTER — Encounter: Payer: Self-pay | Admitting: Nurse Practitioner

## 2017-11-11 NOTE — Progress Notes (Signed)
Reviewed and agree with documentation and assessment and plan. K. Veena Constantino Starace , MD   

## 2017-11-27 ENCOUNTER — Other Ambulatory Visit: Payer: Self-pay | Admitting: Gastroenterology

## 2017-11-27 ENCOUNTER — Telehealth: Payer: Self-pay | Admitting: Gastroenterology

## 2017-11-27 NOTE — Telephone Encounter (Signed)
Patient calls back. She states she has been experiencing nausea and vomiting after eating. Does not know of any trigger foods. Has "right upper side" abdominal pain that is off and on. Radiates and "it's hard to describe." She has developed heartburn the past week which " I don't get heartburn." States she has been worked up at her PCP but they have not found anything. "My mom is in the medical field. She says I should ask for a HIDA." Previous visits have not addressed this because she didn't have these issues then.

## 2017-11-27 NOTE — Telephone Encounter (Signed)
Please bring patient in for office visit so we can address her issues. Is there any available appointment this week with extender? Thanks

## 2017-11-27 NOTE — Telephone Encounter (Signed)
Left message to call back. Need to discuss her symptoms.

## 2017-11-27 NOTE — Telephone Encounter (Signed)
Spoke with the patient. She is in agreement with an office visit for evaluation. Last provider seen is working the hospital this week. Patient prefers to be seen sooner than next week. Appointment made with Mike Gip, PA-C.

## 2017-11-28 ENCOUNTER — Encounter: Payer: Self-pay | Admitting: Physician Assistant

## 2017-11-28 ENCOUNTER — Other Ambulatory Visit (INDEPENDENT_AMBULATORY_CARE_PROVIDER_SITE_OTHER): Payer: Managed Care, Other (non HMO)

## 2017-11-28 ENCOUNTER — Ambulatory Visit: Payer: Managed Care, Other (non HMO) | Admitting: Physician Assistant

## 2017-11-28 VITALS — BP 138/72 | HR 64 | Ht 67.0 in | Wt 202.0 lb

## 2017-11-28 DIAGNOSIS — K51919 Ulcerative colitis, unspecified with unspecified complications: Secondary | ICD-10-CM

## 2017-11-28 DIAGNOSIS — R1011 Right upper quadrant pain: Secondary | ICD-10-CM | POA: Diagnosis not present

## 2017-11-28 DIAGNOSIS — G8929 Other chronic pain: Secondary | ICD-10-CM

## 2017-11-28 LAB — COMPREHENSIVE METABOLIC PANEL
ALK PHOS: 47 U/L (ref 39–117)
ALT: 12 U/L (ref 0–35)
AST: 14 U/L (ref 0–37)
Albumin: 4.4 g/dL (ref 3.5–5.2)
BILIRUBIN TOTAL: 0.3 mg/dL (ref 0.2–1.2)
BUN: 11 mg/dL (ref 6–23)
CHLORIDE: 98 meq/L (ref 96–112)
CO2: 27 meq/L (ref 19–32)
CREATININE: 0.51 mg/dL (ref 0.40–1.20)
Calcium: 9.4 mg/dL (ref 8.4–10.5)
GFR: 149.41 mL/min (ref >60.00)
GLUCOSE: 86 mg/dL (ref 70–99)
POTASSIUM: 4.4 meq/L (ref 3.5–5.1)
Sodium: 130 mEq/L — ABNORMAL LOW (ref 135–145)
Total Protein: 7.7 g/dL (ref 6.0–8.3)

## 2017-11-28 LAB — CBC WITH DIFFERENTIAL/PLATELET
BASOS ABS: 0 10*3/uL (ref 0.0–0.1)
Basophils Relative: 0.5 % (ref 0.0–3.0)
Eosinophils Absolute: 0.1 10*3/uL (ref 0.0–0.7)
Eosinophils Relative: 1.5 % (ref 0.0–5.0)
HEMATOCRIT: 37.6 % (ref 36.0–46.0)
Hemoglobin: 13.3 g/dL (ref 12.0–15.0)
LYMPHS PCT: 30.3 % (ref 12.0–46.0)
Lymphs Abs: 2.3 10*3/uL (ref 0.7–4.0)
MCHC: 35.4 g/dL (ref 30.0–36.0)
MCV: 93.4 fl (ref 78.0–100.0)
MONOS PCT: 8.6 % (ref 3.0–12.0)
Monocytes Absolute: 0.6 10*3/uL (ref 0.1–1.0)
NEUTROS ABS: 4.5 10*3/uL (ref 1.4–7.7)
Neutrophils Relative %: 59.1 % (ref 43.0–77.0)
PLATELETS: 291 10*3/uL (ref 150.0–400.0)
RBC: 4.03 Mil/uL (ref 3.87–5.11)
RDW: 12.8 % (ref 11.5–15.5)
WBC: 7.5 10*3/uL (ref 4.0–10.5)

## 2017-11-28 LAB — HIGH SENSITIVITY CRP: CRP HIGH SENSITIVITY: 1.43 mg/L (ref 0.000–5.000)

## 2017-11-28 MED ORDER — TRAMADOL HCL 50 MG PO TABS: 50.0000 mg | ORAL_TABLET | Freq: Four times a day (QID) | ORAL | 0 refills | Status: DC | PRN

## 2017-11-28 MED ORDER — ONDANSETRON HCL 4 MG PO TABS: ORAL_TABLET | ORAL | 0 refills | Status: DC

## 2017-11-28 NOTE — Patient Instructions (Signed)
Your provider has requested that you go to the basement level for lab work before leaving today. Press "B" on the elevator. The lab is located at the first door on the left as you exit the elevator. Continue Apriso 0.375 mg- 4 tablets daily. Continue Protonix 40 mg- Take 1 tablet every morning. We sent prescriptions.  1. zofran 4 mg  2. Tramadol 50 mg  We have given you a work note for the day of the colonoscopy, 12-11-2017. Also the CT scan is 10-14 and we have given a work note for that day.  You have been scheduled for a CT scan of the abdomen and pelvis at [facility] ([address]---this is in the same building as [facility]).   You are scheduled on Monday 12-09-2017 at 3:00 PM. You should arrive at 2:45 PM  to your appointment time for registration. Please follow the written instructions below on the day of your exam:  WARNING: IF YOU ARE ALLERGIC TO IODINE/X-RAY DYE, PLEASE NOTIFY RADIOLOGY IMMEDIATELY AT [phone]! YOU WILL BE GIVEN A 13 HOUR PREMEDICATION PREP.  1) Do not eat  anything after 11:00 am (4 hours prior to your test) 2) You have been given 2 bottles of oral contrast to drink. The solution may taste better if refrigerated, but do NOT add ice or any other liquid to this solution. Shake well before drinking.    Drink 1 bottle of contrast @ 1:00 PM (2 hours prior to your exam)  Drink 1 bottle of contrast @ 2:00 PM (1 hour prior to your exam)  You may take any medications as prescribed with a small amount of water, if necessary. If you take any of the following medications: METFORMIN, GLUCOPHAGE, GLUCOVANCE, AVANDAMET, RIOMET, FORTAMET, Donaldson MET, JANUMET, GLUMETZA or METAGLIP, you MAY be asked to HOLD this medication 48 hours AFTER the exam.  The purpose of you drinking the oral contrast is to aid in the visualization of your intestinal tract. The contrast solution may cause some diarrhea. Depending on your individual set of symptoms, you may  also receive an intravenous injection of x-ray contrast/dye. Plan on being at [facility] for 30 minutes or longer, depending on the type of exam you are having performed.  This test typically takes 30-45 minutes to complete.  If you have any questions regarding your exam or if you need to reschedule, you may call the CT department at [phone] between the hours of 8:00 am and 5:00 pm, Monday-Friday.  ________________________________________________________________________

## 2017-11-28 NOTE — Progress Notes (Signed)
Subjective:    Patient ID: Nicole Reeves, female    DOB: Jun 15, 1985, 32 y.o.   MRN: [id number]  HPI Nicole Reeves is a 32 year old white female, known to Dr. Lavon Paganini who has history of left-sided ulcerative colitis which was diagnosed in 2016.  She is currently being maintained on Apriso 1.5 g daily.  Her last colonoscopy was in January 2017.  She also has been diagnosed with erythema nodosum, and has history of anxiety/depression and prior history of hepatitis C which had been treated and eradicated. She was seen in the office few weeks ago by Willette Cluster, NP that time complained of some vague nausea.  Colitis symptoms have been fairly stable but with new diagnosis of erythema nodosum it was felt repeat colonoscopy was indicated for surveillance. Scheduled for colonoscopy later this month. Today she says that the nausea has continued but she has also developed right upper quadrant and epigastric abdominal pain which is intermittent but present on a daily basis.  She has not had any fever or chills.  She describes the pain as being stabbing in nature at times and eating generally is making her feel worse with increase in discomfort.  She says her normal bowel habits are to have 1-2 loose stools per day think she is had some increase in diarrhea over the past several weeks but is not noticing any blood. She just started on Protonix milligrams daily which have been called in for her.  No change thus far.  She is not using any regular aspirin or NSAIDs. She had ultrasound in May 2018 which showed no gallstones or sludge common bile duct of 3 mm and a normal liver.  Review of Systems Pertinent positive and negative review of systems were noted in the above HPI section.  All other review of systems was otherwise negative.  Outpatient Encounter Medications as of 11/28/2017  Medication Sig  . APRISO 0.375 g 24 hr capsule TAKE 4 CAPSULES BY MOUTH EVERY DAY *NEED OFFICE VISIT FOR REFILLS*  .  ARIPiprazole (ABILIFY) 10 MG tablet Take 10 mg by mouth daily.  . citalopram (CELEXA) 40 MG tablet Take 1 tablet (40 mg total) by mouth daily.  . hydrOXYzine (ATARAX/VISTARIL) 25 MG tablet Take 1 tablet (25 mg total) by mouth 3 (three) times daily as needed for anxiety.  . Multiple Vitamin (MULTIVITAMIN WITH MINERALS) TABS tablet Take 1 tablet by mouth daily.  . Na Sulfate-K Sulfate-Mg Sulf 17.5-3.13-1.6 GM/177ML SOLN Suprep-Use as directed  . ondansetron (ZOFRAN) 4 MG tablet Take 4 mg by mouth every 6 (six) hours as needed.  Marland Kitchen oxcarbazepine (TRILEPTAL) 600 MG tablet Take 1.5 tablets (900 mg total) by mouth at bedtime.  . pantoprazole (PROTONIX) 40 MG tablet Take 1 tablet by mouth daily.  . ondansetron (ZOFRAN) 4 MG tablet Take 1 tablet by mouth every 6 hours as needed for nausea.  . traMADol (ULTRAM) 50 MG tablet Take 1 tablet (50 mg total) by mouth every 6 (six) hours as needed.  . [DISCONTINUED] mirtazapine (REMERON) 15 MG tablet Take 1 tablet (15 mg total) by mouth at bedtime. (Patient not taking: Reported on 11/08/2017)   No facility-administered encounter medications on file as of 11/28/2017.    No Known Allergies Patient Active Problem List   Diagnosis Date Noted  . Chronic hepatitis C without hepatic coma (HCC) 06/28/2016  . Vaccine counseling 06/28/2016  . Diarrhea   . Blood in stool   . Hyponatremia 12/07/2014  . Acute colitis 12/07/2014   Social History  Socioeconomic History  . Marital status: Single    Spouse name: Not on file  . Number of children: 0  . Years of education: Not on file  . Highest education level: Not on file  Occupational History  . Occupation: [employer]   Social Needs  . Financial resource strain: Not hard at all  . Food insecurity:    Worry: Never true    Inability: Never true  . Transportation needs:    Medical: No    Non-medical: No  Tobacco Use  . Smoking status: Former Smoker    Packs/day: 0.25    Types: Cigarettes, E-cigarettes  . Smokeless  tobacco: Never Used  . Tobacco comment: Reports desire to quite e-cigarettes   Substance and Sexual Activity  . Alcohol use: No    Alcohol/week: 0.0 standard drinks  . Drug use: No    Types: Marijuana    Comment: not used since 02/2014  . Sexual activity: Yes    Partners: Female  Lifestyle  . Physical activity:    Days per week: 0 days    Minutes per session: Not on file  . Stress: To some extent  Relationships  . Social connections:    Talks on phone: More than three times a week    Gets together: Never    Attends religious service: More than 4 times per year    Active member of club or organization: No    Attends meetings of clubs or organizations: Never    Relationship status: Living with partner  . Intimate partner violence:    Fear of current or ex partner: No    Emotionally abused: No    Physically abused: No    Forced sexual activity: No  Other Topics Concern  . Not on file  Social History Narrative  . Not on file    Ms. Cumpian's family history includes ADD / ADHD in her sister; Alcohol abuse in her maternal grandfather, maternal grandmother, paternal grandfather, and paternal grandmother; Anxiety disorder in her paternal grandmother and sister; Drug abuse in her paternal aunt and paternal grandmother.      Objective:    Vitals:   11/28/17 1350  BP: 138/72  Pulse: 64    Physical Exam; well-developed young white female in no acute distress, pleasant blood pressure 138/72 pulse 64, BMI 31.6.  HEENT ;nontraumatic normocephalic EOMI PERRLA sclera anicteric buccal mucosa moist, Cardiovascular; regular rate and rhythm with S1-S2 no murmur rub or gallop, Pulmonary ;clear bilaterally, Abdomen ;soft, he is tender in the epigastrium and right upper quadrant some in the right mid quadrant there is no guarding or rebound no palpable mass or hepatosplenomegaly, Rectal ;exam not done, Extremities; no clubbing cyanosis or edema skin warm dry, Neuro psych; alert and oriented, mood  and affect appropriate grossly nonfocal       Assessment & Plan:   #69 32 year old white female with left-sided ulcerative colitis which had been under good control on Apriso 1.5 g daily, with 3-week history of nausea and now epigastric and right upper quadrant pain sometimes radiating into the right lower quadrant stabbing in nature and intermittent throughout the day.  She has increasing discomfort postprandially also feels that she said some increase in loose stools. Upper abdominal ultrasound 1 year ago was negative Etiology of her pain is not clear, rule out ulcer disease, gastropathy duodenitis, rule out colitis exacerbation with element of more proximal colitis.  2 erythema nodosum 3 history of hepatitis C treated and eradicated For anxiety/depression  Plan;  Will continue Protonix 40 mg p.o. every morning I have refilled Zofran 4 mg every 6 hours as needed for nausea Ultram 50 mg every 6 hours PRN for pain to be used sparingly/#30 no refills Is already scheduled for colonoscopy with Dr. Lavon Paganini  in about 2 weeks Schedule for CT of the abdomen and pelvis with contrast Check sed rate/CRP all the recent labs were unremarkable.  Nillie Bartolotta S Vieva Brummitt PA-C 11/28/2017   Cc: No ref. provider found

## 2017-11-29 ENCOUNTER — Telehealth: Payer: Self-pay | Admitting: Physician Assistant

## 2017-11-29 ENCOUNTER — Encounter: Payer: Self-pay | Admitting: Gastroenterology

## 2017-11-29 NOTE — Telephone Encounter (Signed)
Patient left voicemail that she had shaking and a headache after taking Tramadol.  Asks what else she can use for the pain she is experiencing.

## 2017-12-01 NOTE — Progress Notes (Signed)
Reviewed and agree with documentation and assessment and plan. K. Veena Jovahn Breit , MD   

## 2017-12-02 ENCOUNTER — Other Ambulatory Visit: Payer: Self-pay

## 2017-12-02 DIAGNOSIS — R1011 Right upper quadrant pain: Secondary | ICD-10-CM

## 2017-12-02 MED ORDER — HYDROCODONE-ACETAMINOPHEN 5-325 MG PO TABS: 1.0000 | ORAL_TABLET | Freq: Four times a day (QID) | ORAL | 0 refills | Status: DC | PRN

## 2017-12-02 NOTE — Telephone Encounter (Signed)
Patient notified

## 2017-12-02 NOTE — Telephone Encounter (Signed)
Ok to give her Vicoden 5/325 , one q 6 hrs prn # 30 /0

## 2017-12-06 ENCOUNTER — Other Ambulatory Visit: Payer: Self-pay | Admitting: Gastroenterology

## 2017-12-06 NOTE — Telephone Encounter (Signed)
Discussed with the patient. She understands the reasoning and her options. She will go for the CT on 12/09/17 as planned. Thanks me for the call.

## 2017-12-06 NOTE — Telephone Encounter (Signed)
Nicole Reeves, I am at the [facility] doing urgent add-on procedures this PM. Reading chart briefly, she has a purported IBD flare, and now is getting constipated, which could be worse signs overall of being on opioid therapy pain medication. As well, it looks like Amy set her up with 30 pills on the 7th, and the patient should not have completed 30 full pills within just 4-days (should only be around 16 completed if she was doing every 6 hours as prescribed). I am willing to prescribe Ultram but not another opioid. Without her being seen, and her feeling that pain is worsening she may need further evaluation. I'm in and out of advanced procedures this afternoon.  Chester Holstein

## 2017-12-06 NOTE — Telephone Encounter (Signed)
Doc of the day She calls and reports she continues to have severe abd pain RUQ. She is stating she may be a little worse. Has bowel movements but become a "little constipated." Ulcerative colitis patient seen on 11/28/17. Please advise. CT scan is 12/09/17.

## 2017-12-06 NOTE — Telephone Encounter (Signed)
Doc of the day Patient calls with complaints of continuing RUQ pain. She states her pain "may be a little worse." She is having normal bowel movements but may be "a little constipated."  UC patient seen in office 11/28/17. CT is 12/09/17. Patient is requesting refill of Vicodin. Did not tolerate Tramadol.

## 2017-12-06 NOTE — Telephone Encounter (Signed)
Patient needs refills for hydrocodone

## 2017-12-06 NOTE — Telephone Encounter (Signed)
Sending back to you Beth per your request

## 2017-12-09 ENCOUNTER — Ambulatory Visit (INDEPENDENT_AMBULATORY_CARE_PROVIDER_SITE_OTHER)
Admission: RE | Admit: 2017-12-09 | Discharge: 2017-12-09 | Disposition: A | Discharge status: Home / Self Care | Payer: Managed Care, Other (non HMO) | Source: Ambulatory Visit | Attending: Physician Assistant | Admitting: Physician Assistant

## 2017-12-09 ENCOUNTER — Other Ambulatory Visit: Payer: Self-pay

## 2017-12-09 ENCOUNTER — Telehealth: Payer: Self-pay | Admitting: Gastroenterology

## 2017-12-09 DIAGNOSIS — R1011 Right upper quadrant pain: Secondary | ICD-10-CM | POA: Diagnosis not present

## 2017-12-09 DIAGNOSIS — G8929 Other chronic pain: Secondary | ICD-10-CM

## 2017-12-09 DIAGNOSIS — K51919 Ulcerative colitis, unspecified with unspecified complications: Secondary | ICD-10-CM | POA: Diagnosis not present

## 2017-12-09 MED ORDER — IOPAMIDOL (ISOVUE-300) INJECTION 61%
100.0000 mL | Freq: Once | INTRAVENOUS | Status: AC | PRN
  Administered 2017-12-09: 100 mL via INTRAVENOUS

## 2017-12-09 NOTE — Telephone Encounter (Signed)
Reviewed CT abdomen pelvis.  Large amount of stool burden.  No bowel wall thickening or fistula. Patient is scheduled for colonoscopy on December 11, 2017.  She will need 2-day bowel prep.

## 2017-12-09 NOTE — Telephone Encounter (Signed)
Discussed with the patient. She will begin the prep tonight. Miralax 7 capfuls in 32 oz Gatorade (no red or purple) starting at 7 pm 2 Doculax tablets 1 hour later Clear liquids starting at 7 pm 2 duculax tablets at 8 am continue clear liquids. Follow prep instructions she already has from there.

## 2017-12-11 ENCOUNTER — Ambulatory Visit (AMBULATORY_SURGERY_CENTER): Payer: Managed Care, Other (non HMO) | Admitting: Gastroenterology

## 2017-12-11 ENCOUNTER — Encounter: Payer: Self-pay | Admitting: Gastroenterology

## 2017-12-11 VITALS — BP 96/56 | HR 66 | Temp 97.8°F | Resp 14 | Ht 62.0 in | Wt 202.0 lb

## 2017-12-11 DIAGNOSIS — K51919 Ulcerative colitis, unspecified with unspecified complications: Secondary | ICD-10-CM | POA: Diagnosis present

## 2017-12-11 DIAGNOSIS — R1031 Right lower quadrant pain: Secondary | ICD-10-CM

## 2017-12-11 MED ORDER — SODIUM CHLORIDE 0.9 % IV SOLN: 500.0000 mL | Freq: Once | INTRAVENOUS | Status: DC

## 2017-12-11 NOTE — Progress Notes (Signed)
1322;  Patient states she had dark brown liquid as her last bowel movement.  Pt up to bathroom and had clear liquid results with brown sediment in the bottom of the bowl.  Dr. Lavon Paganini discussed prep results with patient and decided to go ahead with the procedure today.

## 2017-12-11 NOTE — Op Note (Addendum)
[place] Patient Name: Nicole Reeves Procedure Date: 12/11/2017 1:03 PM MRN: [id number] Endoscopist: Mauri Pole , MD Age: 32 Referring MD:  Date of Birth: 1985/07/23 Gender: Female Account #: [id number] Procedure:                Colonoscopy Indications:              Determine extent and severity of inflammatory bowel                            disease, Abdominal pain in the right lower quadrant Medicines:                Monitored Anesthesia Care Procedure:                Pre-Anesthesia Assessment:                           - Prior to the procedure, a History and Physical                            was performed, and patient medications and                            allergies were reviewed. The patient's tolerance of                            previous anesthesia was also reviewed. The risks                            and benefits of the procedure and the sedation                            options and risks were discussed with the patient.                            All questions were answered, and informed consent                            was obtained. Prior Anticoagulants: The patient has                            taken no previous anticoagulant or antiplatelet                            agents. ASA Grade Assessment: II - A patient with                            mild systemic disease. After reviewing the risks                            and benefits, the patient was deemed in                            satisfactory condition to undergo the procedure.  After obtaining informed consent, the colonoscope                            was passed under direct vision. Throughout the                            procedure, the patient's blood pressure, pulse, and                            oxygen saturations were monitored continuously. The                            Model PCF-[id number]) scope was introduced       through the anus and advanced to the the cecum,                            identified by appendiceal orifice and ileocecal                            valve. The colonoscopy was performed without                            difficulty. The patient tolerated the procedure                            well. The quality of the bowel preparation was                            fair. The ileocecal valve, appendiceal orifice, and                            rectum were photographed. Scope In: 1:37:59 PM Scope Out: 1:56:14 PM Scope Withdrawal Time: 0 hours 12 minutes 56 seconds  Total Procedure Duration: 0 hours 18 minutes 15 seconds  Findings:                 The perianal and digital rectal examinations were                            normal.                           Mild Inflammation, healed scar, altered vascular                            pattern, peusdopolyps were found in the left side                            of colon. This was graded as Mayo Score 1 (mild,                            with erythema, decreased vascular pattern, mild                            friability), and when  compared to the previous                            examination, the findings are in remission.                            Biopsies were taken with a cold forceps for                            histology from through out the colon.                           Terminal ileum could not be intubated as cecum was                            filled with stool residue. Complications:            No immediate complications. Estimated Blood Loss:     Estimated blood loss was minimal. Impression:               - Preparation of the colon was fair.                           - Mild (Mayo Score 1) left-sided ulcerative                            colitis, in remission since the last examination.                            Biopsied. Recommendation:           - Patient has a contact number available for                             emergencies. The signs and symptoms of potential                            delayed complications were discussed with the                            patient. Return to normal activities tomorrow.                            Written discharge instructions were provided to the                            patient.                           - Resume previous diet.                           - Continue present medications.                           - Await pathology results.                           -  Repeat colonoscopy in 10 years for surveillance                            based on pathology results.                           - Continue Apriso                           - Miralax 1 capful daily                           - Return to GI clinic in 4-6 weeks. Mauri Pole, MD 12/11/2017 2:09:14 PM This report has been signed electronically.

## 2017-12-11 NOTE — Progress Notes (Signed)
To recovery, report to RN, VSS. 

## 2017-12-11 NOTE — Progress Notes (Signed)
Called to room to assist during endoscopic procedure.  Patient ID and intended procedure confirmed with present staff. Received instructions for my participation in the procedure from the performing physician.  

## 2017-12-11 NOTE — Progress Notes (Signed)
History reviewed today 

## 2017-12-11 NOTE — Patient Instructions (Signed)
YOU HAD AN ENDOSCOPIC PROCEDURE TODAY AT THE [facility]:   Refer to the procedure report that was given to you for any specific questions about what was found during the examination.  If the procedure report does not answer your questions, please call your gastroenterologist to clarify.  If you requested that your care partner not be given the details of your procedure findings, then the procedure report has been included in a sealed envelope for you to review at your convenience later.  YOU SHOULD EXPECT: Some feelings of bloating in the abdomen. Passage of more gas than usual.  Walking can help get rid of the air that was put into your GI tract during the procedure and reduce the bloating. If you had a lower endoscopy (such as a colonoscopy or flexible sigmoidoscopy) you may notice spotting of blood in your stool or on the toilet paper. If you underwent a bowel prep for your procedure, you may not have a normal bowel movement for a few days.  Please Note:  You might notice some irritation and congestion in your nose or some drainage.  This is from the oxygen used during your procedure.  There is no need for concern and it should clear up in a day or so.  SYMPTOMS TO REPORT IMMEDIATELY:   Following lower endoscopy (colonoscopy or flexible sigmoidoscopy):  Excessive amounts of blood in the stool  Significant tenderness or worsening of abdominal pains  Swelling of the abdomen that is new, acute  Fever of 100F or higher  Continue Apriso, Miralax 1 capful daily.  Make f/u appointment with Dr. Lavon Paganini in 4-6 weeks.  For urgent or emergent issues, a gastroenterologist can be reached at any hour by calling [phone].   DIET:  We do recommend a small meal at first, but then you may proceed to your regular diet.  Drink plenty of fluids but you should avoid alcoholic beverages for 24 hours.  ACTIVITY:  You should plan to take it easy for the rest of today and you should NOT DRIVE  or use heavy machinery until tomorrow (because of the sedation medicines used during the test).    FOLLOW UP: Our staff will call the number listed on your records the next business day following your procedure to check on you and address any questions or concerns that you may have regarding the information given to you following your procedure. If we do not reach you, we will leave a message.  However, if you are feeling well and you are not experiencing any problems, there is no need to return our call.  We will assume that you have returned to your regular daily activities without incident.  If any biopsies were taken you will be contacted by phone or by letter within the next 1-3 weeks.  Please call us at [phone] if you have not heard about the biopsies in 3 weeks.    SIGNATURES/CONFIDENTIALITY: You and/or your care partner have signed paperwork which will be entered into your electronic medical record.  These signatures attest to the fact that that the information above on your After Visit Summary has been reviewed and is understood.  Full responsibility of the confidentiality of this discharge information lies with you and/or your care-partner.  Thank you for letting us take care of your healthcare needs today.

## 2017-12-11 NOTE — Progress Notes (Signed)
Patient wants to check her schedule at home  before she makes her 4-6 week follow up appointment.

## 2017-12-12 ENCOUNTER — Telehealth: Payer: Self-pay | Admitting: *Deleted

## 2017-12-12 ENCOUNTER — Telehealth: Payer: Self-pay

## 2017-12-12 NOTE — Telephone Encounter (Signed)
No answer, message left for the patient. 

## 2017-12-12 NOTE — Telephone Encounter (Signed)
  Follow up Call-  Call back number 12/11/2017  Post procedure Call Back phone  # [phone]  Permission to leave phone message Yes  Some recent data might be hidden     Patient questions:  Do you have a fever, pain , or abdominal swelling? No. Pain Score  0 *  Have you tolerated food without any problems? Yes.    Have you been able to return to your normal activities? Yes.    Do you have any questions about your discharge instructions: Diet   No. Medications  No. Follow up visit  No.  Do you have questions or concerns about your Care? No.  Actions: * If pain score is 4 or above: No action needed, pain <4.

## 2017-12-22 ENCOUNTER — Emergency Department (HOSPITAL_COMMUNITY): Payer: Managed Care, Other (non HMO)

## 2017-12-22 ENCOUNTER — Encounter (HOSPITAL_COMMUNITY): Payer: Self-pay | Admitting: *Deleted

## 2017-12-22 ENCOUNTER — Other Ambulatory Visit: Payer: Self-pay

## 2017-12-22 ENCOUNTER — Emergency Department (HOSPITAL_COMMUNITY)
Admission: EM | Admit: 2017-12-22 | Discharge: 2017-12-22 | Disposition: A | Discharge status: Home / Self Care | Payer: Managed Care, Other (non HMO) | Attending: Emergency Medicine | Admitting: Emergency Medicine

## 2017-12-22 DIAGNOSIS — F419 Anxiety disorder, unspecified: Secondary | ICD-10-CM | POA: Insufficient documentation

## 2017-12-22 DIAGNOSIS — M79672 Pain in left foot: Secondary | ICD-10-CM | POA: Diagnosis present

## 2017-12-22 LAB — CBC WITH DIFFERENTIAL/PLATELET
ABS IMMATURE GRANULOCYTES: 0.03 10*3/uL (ref 0.00–0.07)
Basophils Absolute: 0.1 10*3/uL (ref 0.0–0.1)
Basophils Relative: 1 %
Eosinophils Absolute: 0.3 10*3/uL (ref 0.0–0.5)
Eosinophils Relative: 3 %
HEMATOCRIT: 36 % (ref 36.0–46.0)
HEMOGLOBIN: 11.8 g/dL — AB (ref 12.0–15.0)
Immature Granulocytes: 0 %
LYMPHS ABS: 1.5 10*3/uL (ref 0.7–4.0)
LYMPHS PCT: 17 %
MCH: 32 pg (ref 26.0–34.0)
MCHC: 32.8 g/dL (ref 30.0–36.0)
MCV: 97.6 fL (ref 80.0–100.0)
Monocytes Absolute: 0.8 10*3/uL (ref 0.1–1.0)
Monocytes Relative: 9 %
NEUTROS ABS: 6.2 10*3/uL (ref 1.7–7.7)
Neutrophils Relative %: 70 %
Platelets: 414 10*3/uL — ABNORMAL HIGH (ref 150–400)
RBC: 3.69 MIL/uL — ABNORMAL LOW (ref 3.87–5.11)
RDW: 12.8 % (ref 11.5–15.5)
WBC: 8.9 10*3/uL (ref 4.0–10.5)
nRBC: 0 % (ref 0.0–0.2)

## 2017-12-22 LAB — TSH: TSH: 2.463 u[IU]/mL (ref 0.350–4.500)

## 2017-12-22 LAB — PREGNANCY, URINE: Preg Test, Ur: NEGATIVE

## 2017-12-22 LAB — URINALYSIS, ROUTINE W REFLEX MICROSCOPIC
BILIRUBIN URINE: NEGATIVE
GLUCOSE, UA: NEGATIVE mg/dL
HGB URINE DIPSTICK: NEGATIVE
KETONES UR: NEGATIVE mg/dL
NITRITE: NEGATIVE
PH: 5 (ref 5.0–8.0)
Protein, ur: NEGATIVE mg/dL
SPECIFIC GRAVITY, URINE: 1.014 (ref 1.005–1.030)

## 2017-12-22 LAB — COMPREHENSIVE METABOLIC PANEL
ALBUMIN: 3.6 g/dL (ref 3.5–5.0)
ALT: 14 U/L (ref 0–44)
AST: 16 U/L (ref 15–41)
Alkaline Phosphatase: 52 U/L (ref 38–126)
Anion gap: 9 (ref 5–15)
BUN: 12 mg/dL (ref 6–20)
CHLORIDE: 105 mmol/L (ref 98–111)
CO2: 27 mmol/L (ref 22–32)
Calcium: 9.2 mg/dL (ref 8.9–10.3)
Creatinine, Ser: 0.49 mg/dL (ref 0.44–1.00)
GFR calc Af Amer: >60 mL/min (ref >60)
GLUCOSE: 106 mg/dL — AB (ref 70–99)
POTASSIUM: 4.2 mmol/L (ref 3.5–5.1)
SODIUM: 141 mmol/L (ref 135–145)
Total Bilirubin: <0.1 mg/dL — ABNORMAL LOW (ref 0.3–1.2)
Total Protein: 7.3 g/dL (ref 6.5–8.1)

## 2017-12-22 LAB — D-DIMER, QUANTITATIVE: D-Dimer, Quant: 0.97 ug/mL-FEU — ABNORMAL HIGH (ref 0.00–0.50)

## 2017-12-22 MED ORDER — KETOROLAC TROMETHAMINE 30 MG/ML IJ SOLN
30.0000 mg | Freq: Once | INTRAMUSCULAR | Status: DC
  Filled 2017-12-22: qty 1

## 2017-12-22 MED ORDER — GABAPENTIN 300 MG PO CAPS
300.0000 mg | ORAL_CAPSULE | Freq: Once | ORAL | Status: DC
  Filled 2017-12-22: qty 1

## 2017-12-22 NOTE — ED Triage Notes (Signed)
Pt states she wakes up in middle of night with extreme left foot pain and has noted swelling in her feet for a few weeks

## 2017-12-22 NOTE — ED Notes (Signed)
ED Provider at bedside. 

## 2017-12-22 NOTE — ED Provider Notes (Signed)
[facility] DEPT Provider Note   CSN: [id number] Arrival date & time: 12/22/17  0931     History   Chief Complaint Chief Complaint  Patient presents with  . Foot Pain    Left    HPI Nicole Reeves is a 32 y.o. female.  Pt presents to the ED today with left foot pain.  Pt said it's been going on for about 2 weeks.  It will wake her up at night.  It is a burning type pain that radiates up to her knee.  She denies any trauma.  Usually, walking around will help it.  That did help this morning.  She does take subutex which she took this morning without relief of pain.  No cp.  No sob.     Past Medical History:  Diagnosis Date  . Anxiety   . Colitis 11/2014   acute onset bloody diarrhea. C diff, O&P negative.   . Depression   . Hepatitis C 04/2016  . Left sided ulcerative colitis ([place])   . Polysubstance abuse ([place]) 02/24/2014   tox screen + for THC, Barbiturates, amphetimine.    Patient Active Problem List   Diagnosis Date Noted  . Chronic hepatitis C without hepatic coma ([place]) 06/28/2016  . Vaccine counseling 06/28/2016  . Diarrhea   . Blood in stool   . Hyponatremia 12/07/2014  . Acute colitis 12/07/2014    Past Surgical History:  Procedure Laterality Date  . FLEXIBLE SIGMOIDOSCOPY N/A 12/10/2014   Procedure: FLEXIBLE SIGMOIDOSCOPY;  Surgeon: Mauri Pole, MD;  Location: [facility];  Service: Endoscopy;  Laterality: N/A;     OB History   None      Home Medications    Prior to Admission medications   Medication Sig Start Date End Date Taking? Authorizing Provider  APRISO 0.375 g 24 hr capsule TAKE 4 CAPSULES BY MOUTH EVERY DAY *NEED OFFICE VISIT FOR REFILLS* 11/27/17   Mauri Pole, MD  ARIPiprazole (ABILIFY) 10 MG tablet Take 10 mg by mouth daily.    [provider]  buprenorphine (SUBUTEX) 8 MG SUBL SL tablet Place 8 mg under the tongue 2 (two) times daily as needed. 11/30/17   [provider]  citalopram (CELEXA) 40 MG tablet Take 1 tablet (40 mg total) by mouth daily. 08/14/17 08/14/18  Aundra Dubin, MD  clonazePAM (KLONOPIN) 0.5 MG tablet Take 0.5 mg by mouth daily as needed. 12/09/17   [provider]  HYDROcodone-acetaminophen (NORCO/VICODIN) 5-325 MG tablet Take 1 tablet by mouth every 6 (six) hours as needed for moderate pain. 12/02/17   Esterwood, Amy S, PA-C  hydrOXYzine (ATARAX/VISTARIL) 25 MG tablet Take 1 tablet (25 mg total) by mouth 3 (three) times daily as needed for anxiety. 07/25/17   Eksir, Richard Miu, MD  Multiple Vitamin (MULTIVITAMIN WITH MINERALS) TABS tablet Take 1 tablet by mouth daily. 12/13/14   Regalado, Belkys A, MD  ondansetron (ZOFRAN) 4 MG tablet Take 1 tablet by mouth every 6 hours as needed for nausea. 11/28/17   Esterwood, Amy S, PA-C  oxcarbazepine (TRILEPTAL) 600 MG tablet Take 1.5 tablets (900 mg total) by mouth at bedtime. 08/14/17 08/14/18  Aundra Dubin, MD  pantoprazole (PROTONIX) 40 MG tablet Take 1 tablet by mouth daily. 11/27/17   [provider]  traMADol (ULTRAM) 50 MG tablet Take 1 tablet (50 mg total) by mouth every 6 (six) hours as needed. 11/28/17   Alfredia Ferguson, PA-C    Family History Family  History  Problem Relation Age of Onset  . ADD / ADHD Sister   . Anxiety disorder Sister   . Drug abuse Paternal Aunt   . Alcohol abuse Maternal Grandfather   . Alcohol abuse Maternal Grandmother   . Alcohol abuse Paternal Grandfather   . Alcohol abuse Paternal Grandmother   . Anxiety disorder Paternal Grandmother   . Drug abuse Paternal Grandmother   . Colon cancer Neg Hx   . Esophageal cancer Neg Hx   . Rectal cancer Neg Hx   . Stomach cancer Neg Hx   . Colon polyps Neg Hx     Social History Social History   Tobacco Use  . Smoking status: Former Smoker    Packs/day: 0.25    Types: Cigarettes, E-cigarettes  . Smokeless tobacco: Never Used  . Tobacco comment: Reports desire to quite  e-cigarettes   Substance Use Topics  . Alcohol use: No    Alcohol/week: 0.0 standard drinks  . Drug use: No    Types: Marijuana    Comment: not used since 02/2014     Allergies   Patient has no known allergies.   Review of Systems Review of Systems  Musculoskeletal:       Dg foot pain  All other systems reviewed and are negative.    Physical Exam Updated Vital Signs BP 112/70 (BP Location: Left Arm)   Pulse 92   Temp 98.6 F (37 C) (Oral)   Resp 16   Ht 5' 7"  (1.702 m)   Wt 90.7 kg   LMP 12/03/2017   SpO2 100%   BMI 31.32 kg/m   Physical Exam  Constitutional: She is oriented to person, place, and time. She appears well-developed and well-nourished.  HENT:  Head: Normocephalic and atraumatic.  Right Ear: External ear normal.  Left Ear: External ear normal.  Nose: Nose normal.  Mouth/Throat: Oropharynx is clear and moist.  Eyes: Pupils are equal, round, and reactive to light. Conjunctivae and EOM are normal.  Neck: Normal range of motion. Neck supple.  Cardiovascular: Normal rate, regular rhythm, normal heart sounds and intact distal pulses.  Pulmonary/Chest: Effort normal and breath sounds normal.  Abdominal: Soft. Bowel sounds are normal.  Musculoskeletal:       Feet:  Neurological: She is alert and oriented to person, place, and time.  Skin: Skin is warm and dry. Capillary refill takes less than 2 seconds.  Psychiatric: She has a normal mood and affect. Her behavior is normal. Judgment and thought content normal.  Nursing note and vitals reviewed.    ED Treatments / Results  Labs (all labs ordered are listed, but only abnormal results are displayed) Labs Reviewed  PREGNANCY, URINE  COMPREHENSIVE METABOLIC PANEL  CBC WITH DIFFERENTIAL/PLATELET  URINALYSIS, ROUTINE W REFLEX MICROSCOPIC  D-DIMER, QUANTITATIVE (NOT AT [facility])  TSH    EKG None  Radiology No results found.  Procedures Procedures (including critical care time)  Medications  Ordered in ED Medications  ketorolac (TORADOL) 30 MG/ML injection 30 mg (30 mg Intravenous Refused 12/22/17 1042)  gabapentin (NEURONTIN) capsule 300 mg (300 mg Oral Refused 12/22/17 1042)     Initial Impression / Assessment and Plan / ED Course  I have reviewed the triage vital signs and the nursing notes.  Pertinent labs & imaging results that were available during my care of the patient were reviewed by me and considered in my medical decision making (see chart for details).  Nurse went to start IV and give pt her meds.  She refused to let the nurse give her the meds.  She refused labs and IV and Xray.  She told the nurse she wanted to leave.  I went to talk to her and she was already gone.  Nurse said she signed AMA.  I did not get a chance to talk to her or to give her any instructions.    Final Clinical Impressions(s) / ED Diagnoses   Final diagnoses:  Foot pain, left    ED Discharge Orders    None       Isla Pence, MD 12/22/17 1110

## 2017-12-22 NOTE — ED Notes (Signed)
Bed: [facility] Expected date:  Expected time:  Means of arrival:  Comments: 

## 2017-12-23 ENCOUNTER — Other Ambulatory Visit: Payer: Self-pay

## 2017-12-23 DIAGNOSIS — K51919 Ulcerative colitis, unspecified with unspecified complications: Secondary | ICD-10-CM

## 2017-12-23 MED ORDER — BUDESONIDE 3 MG PO CPEP: 9.0000 mg | ORAL_CAPSULE | Freq: Every day | ORAL | 2 refills | Status: DC

## 2018-01-02 ENCOUNTER — Ambulatory Visit: Payer: Self-pay | Admitting: Physician Assistant

## 2018-01-09 ENCOUNTER — Encounter (HOSPITAL_COMMUNITY): Payer: Self-pay | Admitting: Emergency Medicine

## 2018-01-09 ENCOUNTER — Emergency Department (HOSPITAL_COMMUNITY)
Admission: EM | Admit: 2018-01-09 | Discharge: 2018-01-09 | Disposition: A | Discharge status: Home / Self Care | Payer: Managed Care, Other (non HMO) | Attending: Emergency Medicine | Admitting: Emergency Medicine

## 2018-01-09 ENCOUNTER — Other Ambulatory Visit: Payer: Self-pay

## 2018-01-09 DIAGNOSIS — Z79899 Other long term (current) drug therapy: Secondary | ICD-10-CM | POA: Insufficient documentation

## 2018-01-09 DIAGNOSIS — Z87891 Personal history of nicotine dependence: Secondary | ICD-10-CM | POA: Insufficient documentation

## 2018-01-09 DIAGNOSIS — M791 Myalgia, unspecified site: Secondary | ICD-10-CM | POA: Diagnosis not present

## 2018-01-09 DIAGNOSIS — R52 Pain, unspecified: Secondary | ICD-10-CM | POA: Diagnosis present

## 2018-01-09 NOTE — ED Triage Notes (Signed)
Pt states whole left side has been in pain anytime she does anything. For about month. 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  Pt is unsure what made the pain start. Pt fell down the stairs this past month due to the pain. Pt smokes CBD oil to help with the pain, last dose was this morning.  114

## 2018-01-09 NOTE — ED Notes (Signed)
Bed: [facility] Expected date:  Expected time:  Means of arrival:  Comments: 

## 2018-01-09 NOTE — Discharge Instructions (Addendum)
Please call and follow up with neurologist for further evaluation of your left sided body pain.  Follow up with your doctor for further care.  Continue taking your Subutex and neurontin for pain.

## 2018-01-09 NOTE — ED Provider Notes (Signed)
[facility]-EMERGENCY DEPT Provider Note   CSN: [id number] Arrival date & time: 01/09/18  1022     History   Chief Complaint Chief Complaint  Patient presents with  . Foot Pain    HPI Nicole Reeves is a 32 y.o. female.  The history is provided by the patient and medical records. No language interpreter was used.  Foot Pain      32 year old female with history of polysubstance abuse, hepatitis C, depression, anxiety, presenting complaining of pain to the left side of her body.  Patient report for the past 1 month and a half she has had persistent pain starting in her left shoulder and extended throughout the entire left side of her body down to her foot.  Pain is described as a sharp, burning, shooting sensation, more noticeable at nighttime and it has been persistent.  Pain did subside with taking Suboxone.  She denies any associated fever, chills, rash, injury, weakness, bowel bladder incontinence or saddle anesthesia.  She has been taking Lyrica, and gabapentin without adequate relief.  She does have a referral appointment with neurologist but the appointment is in February and she cannot wait that long.  She is here requesting for help.  She is here requesting for help.  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  Past Medical History:  Diagnosis Date  . Anxiety   . Colitis 11/2014   acute onset bloody diarrhea. C diff, O&P negative.   . Depression   . Hepatitis C 04/2016  . Left sided ulcerative colitis ([facility])   . Polysubstance abuse ([facility]) 02/24/2014   tox screen + for THC, Barbiturates, amphetimine.    Patient Active Problem List   Diagnosis Date Noted  . Chronic hepatitis C without hepatic coma ([facility]) 06/28/2016  . Vaccine counseling 06/28/2016  . Diarrhea   . Blood in stool   . Hyponatremia 12/07/2014  . Acute colitis 12/07/2014    Past Surgical History:  Procedure Laterality Date    . FLEXIBLE SIGMOIDOSCOPY N/A 12/10/2014   Procedure: FLEXIBLE SIGMOIDOSCOPY;  Surgeon: Napoleon Form, MD;  Location: [facility];  Service: Endoscopy;  Laterality: N/A;     OB History   None      Home Medications    Prior to Admission medications   Medication Sig Start Date End Date Taking? Authorizing Provider  APRISO 0.375 g 24 hr capsule TAKE 4 CAPSULES BY MOUTH EVERY DAY *NEED OFFICE VISIT FOR REFILLS* 11/27/17   Napoleon Form, MD  ARIPiprazole (ABILIFY) 10 MG tablet Take 10 mg by mouth daily.    [provider]  budesonide (ENTOCORT EC) 3 MG 24 hr capsule Take 3 capsules (9 mg total) by mouth daily. 12/23/17   Napoleon Form, MD  buprenorphine (SUBUTEX) 8 MG SUBL SL tablet Place 8 mg under the tongue 2 (two) times daily as needed. 11/30/17   [provider]  citalopram (CELEXA) 40 MG tablet Take 1 tablet (40 mg total) by mouth daily. 08/14/17 08/14/18  Burnard Leigh, MD  clonazePAM (KLONOPIN) 0.5 MG tablet Take 0.5 mg by mouth daily as needed. 12/09/17   [provider]  HYDROcodone-acetaminophen (NORCO/VICODIN) 5-325 MG tablet Take 1 tablet by mouth every 6 (six) hours as needed for moderate pain. 12/02/17   Esterwood, Amy S, PA-C  hydrOXYzine (ATARAX/VISTARIL) 25 MG tablet Take 1 tablet (25 mg total) by mouth 3 (three) times daily as needed for anxiety. 07/25/17  Eksir, Bo Mcclintock, MD  Multiple Vitamin (MULTIVITAMIN WITH MINERALS) TABS tablet Take 1 tablet by mouth daily. 12/13/14   Regalado, Belkys A, MD  ondansetron (ZOFRAN) 4 MG tablet Take 1 tablet by mouth every 6 hours as needed for nausea. 11/28/17   Esterwood, Amy S, PA-C  oxcarbazepine (TRILEPTAL) 600 MG tablet Take 1.5 tablets (900 mg total) by mouth at bedtime. 08/14/17 08/14/18  Burnard Leigh, MD  pantoprazole (PROTONIX) 40 MG tablet Take 1 tablet by mouth daily. 11/27/17   [provider]  traMADol (ULTRAM) 50 MG tablet Take 1 tablet (50 mg total) by mouth  every 6 (six) hours as needed. 11/28/17   Esterwood, Amy S, PA-C    Family History Family History  Problem Relation Age of Onset  . ADD / ADHD Sister   . Anxiety disorder Sister   . Drug abuse Paternal Aunt   . Alcohol abuse Maternal Grandfather   . Alcohol abuse Maternal Grandmother   . Alcohol abuse Paternal Grandfather   . Alcohol abuse Paternal Grandmother   . Anxiety disorder Paternal Grandmother   . Drug abuse Paternal Grandmother   . Colon cancer Neg Hx   . Esophageal cancer Neg Hx   . Rectal cancer Neg Hx   . Stomach cancer Neg Hx   . Colon polyps Neg Hx     Social History Social History   Tobacco Use  . Smoking status: Former Smoker    Packs/day: 0.25    Types: Cigarettes, E-cigarettes  . Smokeless tobacco: Never Used  . Tobacco comment: Reports desire to quite e-cigarettes   Substance Use Topics  . Alcohol use: No    Alcohol/week: 0.0 standard drinks  . Drug use: No    Types: Marijuana    Comment: not used since 02/2014     Allergies   Patient has no known allergies.   Review of Systems Review of Systems  All other systems reviewed and are negative.    Physical Exam Updated Vital Signs BP 112/78   Pulse 88   Temp 97.9 F (36.6 C) (Oral)   Resp 16   Ht 5\' 7"  (1.702 m)   Wt 90.7 kg   LMP 12/30/2017   SpO2 99%   BMI 31.32 kg/m   Physical Exam  Constitutional: She appears well-developed and well-nourished. No distress.  HENT:  Head: Atraumatic.  Eyes: Conjunctivae are normal.  Neck: Neck supple.  Cardiovascular: Normal rate and regular rhythm.  Pulmonary/Chest: Effort normal and breath sounds normal.  Abdominal: Bowel sounds are normal. She exhibits no distension. There is no tenderness.  Musculoskeletal: She exhibits tenderness (Diffuse tenderness to left side of body including left arm, side of chest, abdomen, and legs on gentle palpation without any overlying skin changes.).  Neurological: She is alert.  5 out of 5 strength to all 4  extremities with intact patellar tendon reflex, normal gait no foot drop.  Skin: No rash noted.  Psychiatric: She has a normal mood and affect.  Nursing note and vitals reviewed.    ED Treatments / Results  Labs (all labs ordered are listed, but only abnormal results are displayed) Labs Reviewed - No data to display  EKG None  Radiology No results found.  Procedures Procedures (including critical care time)  Medications Ordered in ED Medications - No data to display   Initial Impression / Assessment and Plan / ED Course  I have reviewed the triage vital signs and the nursing notes.  Pertinent labs & imaging results that  were available during my care of the patient were reviewed by me and considered in my medical decision making (see chart for details).     BP 112/78   Pulse 88   Temp 97.9 F (36.6 C) (Oral)   Resp 16   Ht 5\' 7"  (1.702 m)   Wt 90.7 kg   LMP 12/30/2017   SpO2 99%   BMI 31.32 kg/m    Final Clinical Impressions(s) / ED Diagnoses   Final diagnoses:  Myalgia    ED Discharge Orders    None     1:52 PM Patient here complaining of persistent pain primarily to the affected the left side of her body.  She has no focal weakness on exam.  She has no overlying skin changes.  Pain could be nerve related but she does not have any significant midline cervical spine tenderness.  She is otherwise well-appearing, ambulate without difficulty.  She is currently on Suboxone and also taking Neurontin.  She would like to have a referral to a neurologist which I am happy to provide.  No acute emergent medical condition identified during this visit.   Fayrene Helperran, Margel Joens, PA-C 01/09/18 1353    Raeford RazorKohut, Stephen, MD 01/10/18 [phone]

## 2018-01-17 ENCOUNTER — Other Ambulatory Visit: Payer: Self-pay | Admitting: Physician Assistant

## 2018-01-17 DIAGNOSIS — IMO0001 Reserved for inherently not codable concepts without codable children: Secondary | ICD-10-CM

## 2018-01-17 DIAGNOSIS — R209 Unspecified disturbances of skin sensation: Principal | ICD-10-CM

## 2018-01-17 DIAGNOSIS — M79672 Pain in left foot: Secondary | ICD-10-CM

## 2018-01-28 ENCOUNTER — Ambulatory Visit
Admission: RE | Admit: 2018-01-28 | Discharge: 2018-01-28 | Disposition: A | Discharge status: Home / Self Care | Payer: Managed Care, Other (non HMO) | Source: Ambulatory Visit | Attending: Physician Assistant | Admitting: Physician Assistant

## 2018-01-28 ENCOUNTER — Other Ambulatory Visit: Payer: Self-pay

## 2018-01-28 DIAGNOSIS — R209 Unspecified disturbances of skin sensation: Principal | ICD-10-CM

## 2018-01-28 DIAGNOSIS — IMO0001 Reserved for inherently not codable concepts without codable children: Secondary | ICD-10-CM

## 2018-01-29 ENCOUNTER — Ambulatory Visit
Admission: RE | Admit: 2018-01-29 | Discharge: 2018-01-29 | Disposition: A | Discharge status: Home / Self Care | Payer: Managed Care, Other (non HMO) | Source: Ambulatory Visit | Attending: Physician Assistant | Admitting: Physician Assistant

## 2018-01-29 DIAGNOSIS — M79672 Pain in left foot: Secondary | ICD-10-CM

## 2018-01-31 ENCOUNTER — Other Ambulatory Visit: Payer: Self-pay

## 2018-02-10 ENCOUNTER — Ambulatory Visit: Payer: Self-pay | Admitting: Gastroenterology

## 2018-02-11 ENCOUNTER — Other Ambulatory Visit: Payer: Self-pay | Admitting: Gastroenterology

## 2018-03-14 ENCOUNTER — Encounter: Payer: Self-pay | Admitting: Diagnostic Neuroimaging

## 2018-03-14 ENCOUNTER — Other Ambulatory Visit: Payer: Self-pay

## 2018-03-14 ENCOUNTER — Ambulatory Visit (INDEPENDENT_AMBULATORY_CARE_PROVIDER_SITE_OTHER): Payer: Managed Care, Other (non HMO) | Admitting: Diagnostic Neuroimaging

## 2018-03-14 VITALS — BP 109/78 | HR 78 | Resp 16 | Ht 67.0 in | Wt 214.0 lb

## 2018-03-14 DIAGNOSIS — M5416 Radiculopathy, lumbar region: Secondary | ICD-10-CM | POA: Diagnosis not present

## 2018-03-14 NOTE — Progress Notes (Signed)
[facility]  PATIENT: Nicole Reeves DOB: 08/06/1985  REFERRING CLINICIAN: ER  HISTORY FROM: patient  REASON FOR VISIT: new consult    HISTORICAL  CHIEF COMPLAINT:  Chief Complaint  Patient presents with  . Back Pain    Room 7.  Here with her partner Lennox Laity, for eval of lbp radiating into left leg, foot, onset 3 mos. ago with no known injury. 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  No relief with Trileptal or Gabapentin.  Has been rx'd Lyrica but hasn't picked rx. up yet--plans to do that today./fim    HISTORY OF PRESENT ILLNESS:   33 year old female here for evaluation of low back pain rating to the left foot.  Symptoms started 3 months ago.  She feels pain on the top of her foot.  Patient has had EMG nerve conduction and CT of the lumbar spine which show decreased left peroneal motor response amplitude but no other findings.  CT lumbar spine unremarkable.  She has seen orthopedic surgery clinic and has MRI of the lumbar spine ordered but not done yet.  No specific triggering or aggravating factors.  No recent injuries accidents or traumas.  She has not tried physical therapy yet.   REVIEW OF SYSTEMS: Full 14 system review of systems performed and negative with exception of: Headache numbness weakness snoring depression anxiety racing thoughts joint pain fatigue weight gain.  History of ulcerative colitis.  ALLERGIES: No Known Allergies  HOME MEDICATIONS: Outpatient Medications Prior to Visit  Medication Sig Dispense Refill  . APRISO 0.375 g 24 hr capsule TAKE 4 CAPSULES BY MOUTH EVERY DAY *NEED OFFICE VISIT FOR REFILLS* 360 capsule 1  . ARIPiprazole (ABILIFY) 10 MG tablet Take 10 mg by mouth daily.    . budesonide (ENTOCORT EC) 3 MG 24 hr capsule Take 3 capsules (9 mg total) by mouth daily. 90 capsule 2  . buprenorphine (SUBUTEX) 8 MG SUBL SL tablet Place 8 mg under the tongue daily.   0  .  diazepam (VALIUM) 10 MG tablet Take 25 mg by mouth daily as needed for anxiety.   1  . gabapentin (NEURONTIN) 300 MG capsule Take 900 mg by mouth 3 (three) times daily.    . Multiple Vitamin (MULTIVITAMIN WITH MINERALS) TABS tablet Take 1 tablet by mouth daily. (Patient taking differently: Take 1 tablet by mouth 2 (two) times a week. ) 30 tablet 0  . ondansetron (ZOFRAN) 4 MG tablet Take 1 tablet by mouth every 6 hours as needed for nausea. (Patient taking differently: Take 4 mg by mouth every 6 (six) hours as needed for nausea or vomiting. ) 40 tablet 0  . oxcarbazepine (TRILEPTAL) 600 MG tablet Take 1.5 tablets (900 mg total) by mouth at bedtime. (Patient taking differently: Take 1,200 mg by mouth at bedtime. ) 135 tablet 0  . pantoprazole (PROTONIX) 40 MG tablet Take 40 mg by mouth daily.      No facility-administered medications prior to visit.     PAST MEDICAL HISTORY: Past Medical History:  Diagnosis Date  . Anxiety   . Colitis 11/2014   acute onset bloody diarrhea. C diff, O&P negative.   . Depression   . Hepatitis C 04/2016  . Left sided ulcerative colitis (HCC)   . Polysubstance abuse (HCC) 02/24/2014   tox screen + for THC, Barbiturates, amphetimine.    PAST SURGICAL HISTORY: Past Surgical History:  Procedure Laterality Date  . FLEXIBLE SIGMOIDOSCOPY N/A 12/10/2014  Procedure: FLEXIBLE SIGMOIDOSCOPY;  Surgeon: Napoleon FormKavitha Nandigam V, MD;  Location: [facility];  Service: Endoscopy;  Laterality: N/A;    FAMILY HISTORY: Family History  Problem Relation Age of Onset  . ADD / ADHD Sister   . Anxiety disorder Sister   . Drug abuse Paternal Aunt   . Alcohol abuse Maternal Grandfather   . Alcohol abuse Maternal Grandmother   . Alcohol abuse Paternal Grandfather   . Alcohol abuse Paternal Grandmother   . Anxiety disorder Paternal Grandmother   . Drug abuse Paternal Grandmother   . Anxiety disorder Sister   . Depression Sister   . Colon cancer Neg Hx   . Esophageal cancer  Neg Hx   . Rectal cancer Neg Hx   . Stomach cancer Neg Hx   . Colon polyps Neg Hx     SOCIAL HISTORY: Social History   Socioeconomic History  . Marital status: Single    Spouse name: Not on file  . Number of children: 0  . Years of education: 16  . Highest education level: Bachelor's degree (e.g., BA, AB, BS)  Occupational History  . Occupation: Account CounsellorDevelopment Rep    Comment: [employer]  Social Needs  . Financial resource strain: Not hard at all  . Food insecurity:    Worry: Never true    Inability: Never true  . Transportation needs:    Medical: No    Non-medical: No  Tobacco Use  . Smoking status: Former Smoker    Packs/day: 0.25    Types: Cigarettes, E-cigarettes  . Smokeless tobacco: Never Used  . Tobacco comment: Reports desire to quite e-cigarettes   Substance and Sexual Activity  . Alcohol use: Not Currently    Alcohol/week: 0.0 standard drinks  . Drug use: Not Currently    Types: Marijuana, Heroin    Comment: not used since 02/2014  . Sexual activity: Yes    Partners: Female  Lifestyle  . Physical activity:    Days per week: 0 days    Minutes per session: Not on file  . Stress: To some extent  Relationships  . Social connections:    Talks on phone: More than three times a week    Gets together: Never    Attends religious service: More than 4 times per year    Active member of club or organization: No    Attends meetings of clubs or organizations: Never    Relationship status: Living with partner  . Intimate partner violence:    Fear of current or ex partner: No    Emotionally abused: No    Physically abused: No    Forced sexual activity: No  Other Topics Concern  . Not on file  Social History Narrative   3-4 cups of coffee daily     PHYSICAL EXAM  GENERAL EXAM/CONSTITUTIONAL: Vitals:  Vitals:   03/14/18 0833  BP: 109/78  Pulse: 78  Resp: 16  Weight: 214 lb (97.1 kg)  Height: 5\' 7"  (1.702 m)     Body mass index is 33.52  kg/m. Wt Readings from Last 3 Encounters:  03/14/18 214 lb (97.1 kg)  01/09/18 200 lb (90.7 kg)  12/22/17 200 lb (90.7 kg)     Patient is in no distress; well developed, nourished and groomed; neck is supple  CARDIOVASCULAR:  Examination of carotid arteries is normal; no carotid bruits  Regular rate and rhythm, no murmurs  Examination of peripheral vascular system by observation and palpation is normal  EYES:  Ophthalmoscopic  exam of optic discs and posterior segments is normal; no papilledema or hemorrhages  Visual Acuity Screening   Right eye Left eye Both eyes  Without correction: 20/40 20/30   With correction:        MUSCULOSKELETAL:  Gait, strength, tone, movements noted in Neurologic exam below  NEUROLOGIC: MENTAL STATUS:  No flowsheet data found.  awake, alert, oriented to person, place and time  recent and remote memory intact  normal attention and concentration  language fluent, comprehension intact, naming intact  fund of knowledge appropriate  CRANIAL NERVE:   2nd - no papilledema on fundoscopic exam  2nd, 3rd, 4th, 6th - pupils equal and reactive to light, visual fields full to confrontation, extraocular muscles intact, no nystagmus  5th - facial sensation symmetric  7th - facial strength symmetric  8th - hearing intact  9th - palate elevates symmetrically, uvula midline  11th - shoulder shrug symmetric  12th - tongue protrusion midline  MOTOR:   normal bulk and tone, full strength in the BUE, BLE; EXCEPT LEFT FOOT DORSIFLEXION, INVERSION AND EVERSION 4 (? LIMITED BY PAIN)  SENSORY:   normal and symmetric to light touch, pinprick, temperature, vibration; EXCEPT DECR PP IN BOTTOM OF LEFT FOOT   STRAIGHT LEG RAISE ON LEFT TRIGGERS PAIN AND NUMBNESS IN LEFT FOOT  COORDINATION:   finger-nose-finger, fine finger movements normal  REFLEXES:   deep tendon reflexes --> BUE 2; KNEES 2 (WITH REINFORCEMENT); LEFT ANKLE 2, RIGHT ANKLE  1  GAIT/STATION:   narrow based gait; SLIGHT DIFF WITH TOE AND HEAL GAIT ON LEFT; romberg is negative     DIAGNOSTIC DATA (LABS, IMAGING, TESTING) - I reviewed patient records, labs, notes, testing and imaging myself where available.  Lab Results  Component Value Date   WBC 8.9 12/22/2017   HGB 11.8 (L) 12/22/2017   HCT 36.0 12/22/2017   MCV 97.6 12/22/2017   PLT 414 (H) 12/22/2017      Component Value Date/Time   NA 141 12/22/2017 1030   K 4.2 12/22/2017 1030   CL 105 12/22/2017 1030   CO2 27 12/22/2017 1030   GLUCOSE 106 (H) 12/22/2017 1030   BUN 12 12/22/2017 1030   CREATININE 0.49 12/22/2017 1030   CREATININE 0.63 06/28/2016 1504   CALCIUM 9.2 12/22/2017 1030   PROT 7.3 12/22/2017 1030   ALBUMIN 3.6 12/22/2017 1030   AST 16 12/22/2017 1030   ALT 14 12/22/2017 1030   ALT 63 (H) 06/28/2016 1504   ALKPHOS 52 12/22/2017 1030   BILITOT <0.1 (L) 12/22/2017 1030   GFRNONAA >60 12/22/2017 1030   GFRNONAA >89 06/28/2016 1504   GFRAA >60 12/22/2017 1030   GFRAA >89 06/28/2016 1504   No results found for: CHOL, HDL, LDLCALC, LDLDIRECT, TRIG, CHOLHDL Lab Results  Component Value Date   HGBA1C 5.1 12/08/2014   Lab Results  Component Value Date   VITAMINB12 >1500 (H) 10/21/2015   Lab Results  Component Value Date   TSH 2.463 12/22/2017    02/13/18 EMG/NCS - decr left peroneal motor response amplitude - could be left peroneal motor neuropathy vs left L5 although needle exam does not further localize and lumbar paraspinals not checked  01/28/18 CT lumbar spine [I reviewed images myself and agree with interpretation. Possible disc bulging at L4-5 on the left; possible mild spinal stenosis at L4-5 and L5-S1. -VRP]  - Negative lumbar spine CT.  No explanation for left leg symptoms     ASSESSMENT AND PLAN  33 y.o. year old  female here with:  Dx: LEFT LUMBAR RADICULOPATHY (L5, S1)  1. Left lumbar radiculopathy    PLAN:  - follow up MRI lumbar spine - follow  up with Dr. Penni Bombard  Return for return to Dr. Penni Bombard.    Suanne Marker, MD 03/14/2018, 9:24 AM Certified in Neurology, Neurophysiology and Neuroimaging  [facility] [address], [address] [place], [place] [address] [phone]

## 2018-03-19 ENCOUNTER — Telehealth: Payer: Self-pay | Admitting: Diagnostic Neuroimaging

## 2018-03-19 ENCOUNTER — Other Ambulatory Visit: Payer: Self-pay | Admitting: Gastroenterology

## 2018-03-19 DIAGNOSIS — M5416 Radiculopathy, lumbar region: Secondary | ICD-10-CM

## 2018-03-19 NOTE — Telephone Encounter (Signed)
Pt states that her insurance company will only accept a MRI request from her Neurologist, pt is asking if RN can ask Dr Leta Baptist to make request for her MRI for approval

## 2018-03-20 NOTE — Telephone Encounter (Signed)
From ofv note, order was placed by ortho surgery MRI Lumbar.  I LMVM for pt asking what happened re: this order and why is insurance not approving from them?

## 2018-03-20 NOTE — Telephone Encounter (Signed)
Spoke to pt.  She stated that insurance denied x 4, even with ortho doing a peer to peer??  She stated insurance would most likely approve if neuro did this.  Ok to order?

## 2018-03-20 NOTE — Telephone Encounter (Signed)
I spoke to pt relayed that per Dr. Marjory Lies, that he recommends that ortho clinic (Dr. Samara Snide) to follow thru on there order.  She could not understand why we would not order.  I told her I would be glad to call [facility] and speak to them and see what I could find out.  I called [phone] and spoke to Tania in imaging authorization about the MRI LS.  She stated that it had been denied thru [place] (some question about pt having had one previously).  They could not see that she had, a reconsideration was done, but was denied so they would have to do appeal.  She stated she would call pt and let her know the status of this.

## 2018-03-20 NOTE — Telephone Encounter (Signed)
I recommend that ortho clinic follow through on their order. -VRP

## 2018-03-26 NOTE — Telephone Encounter (Signed)
Received call from [place], from Dr. Scarlette Ar office ([facility]).  She had spoken to Dr. Delilah Shan about this MRI order for pt.  She stated that was denied and insurance asking ? related to neuro.  This needs to be addressed by neuro since we have seen pt.  I will forward to Dr. Leta Baptist.

## 2018-03-26 NOTE — Addendum Note (Signed)
Addended by: Andrey Spearman R on: 03/26/2018 03:31 PM   Modules accepted: Orders

## 2018-03-26 NOTE — Telephone Encounter (Signed)
Order placed. -VRP

## 2018-03-27 ENCOUNTER — Telehealth: Payer: Self-pay | Admitting: Diagnostic Neuroimaging

## 2018-03-27 NOTE — Telephone Encounter (Signed)
I spoke to the patient she is aware.

## 2018-03-27 NOTE — Telephone Encounter (Signed)
[employer] order sent to GI. They obtain the auth and will reach out to the pt to schedule.

## 2018-04-03 NOTE — Telephone Encounter (Signed)
Nicole Reeves: [id number] (exp. 03/28/18 to 06/26/18) pt is scheduled at GI for 04/07/18

## 2018-04-07 ENCOUNTER — Ambulatory Visit
Admission: RE | Admit: 2018-04-07 | Discharge: 2018-04-07 | Disposition: A | Discharge status: Home / Self Care | Payer: Managed Care, Other (non HMO) | Source: Ambulatory Visit | Attending: Diagnostic Neuroimaging | Admitting: Diagnostic Neuroimaging

## 2018-04-07 DIAGNOSIS — M5416 Radiculopathy, lumbar region: Secondary | ICD-10-CM

## 2018-04-09 ENCOUNTER — Telehealth: Payer: Self-pay | Admitting: *Deleted

## 2018-04-09 NOTE — Telephone Encounter (Signed)
Consider PT evaluation. Follow up with PCP. -VRP

## 2018-04-09 NOTE — Telephone Encounter (Signed)
Called patient and informed her Dr Leta Baptist stated she may consider a PT evaluation and FU with her PCP. Patient verbalized understanding, appreciation.

## 2018-04-09 NOTE — Telephone Encounter (Signed)
Spoke with patient and informed her the MRI lumbar spine results are unremarkable. Dr Leta Baptist recommends conservative management re: medication, possible therapy. I advised Dr Leta Baptist stated she return to Dr Delilah Shan. She  asked if Dr Delilah Shan would know cause of her pain, be able to help her. She stated she still doesn't know what is wrong,  wants to know why she is still having intermittent pain that occurs at night. She asked about the EMG which showed neuropathy. I advised her per Dr Gladstone Lighter note it was possible neuropathy. I advised again that conservative management rather than a surgery evaluation is indicated per Dr Leta Baptist.  I asked if she is still taking gabapentin; she has switched to Lyrica 100 mg 3 x daily.  She would like to know if PT is indicated for her symptoms, or otherwise what can be done. I advised will discuss with Dr Leta Baptist. Patient verbalized understanding, appreciation.

## 2018-05-06 ENCOUNTER — Telehealth: Payer: Self-pay | Admitting: Gastroenterology

## 2018-05-06 NOTE — Telephone Encounter (Signed)
Pt states that she has been experiencing nausea, constipation and stomach pain, she is not sure if it is due to medication. I made an appt on 3/25 with Amy but she would like some advise in the meantime. Pls call her.

## 2018-05-06 NOTE — Telephone Encounter (Signed)
Left patient a voice mail. Ask that she confirm if she can come in on 05/08/18 at 3:15 pm with Hyacinth Meeker, PA-C

## 2018-05-06 NOTE — Telephone Encounter (Signed)
Can you please bring her in for office visit with [facility] lemon.  Thanks

## 2018-05-06 NOTE — Telephone Encounter (Signed)
Patient states she always has constipation she feels is due to her medication Subutex. She had a bowel movement today with the use of glycerin suppository. She has complaints of 2 weeks of nausea after eating or drinking. This began 04/28/18. There are no openings this week except one on [address] [place].

## 2018-05-07 NOTE — Telephone Encounter (Signed)
Called the patient. She says she has already called and confirmed the appointment for 05/08/18. I cancelled the appointment scheduled for 05/21/18.

## 2018-05-08 ENCOUNTER — Ambulatory Visit: Payer: Self-pay | Admitting: Physician Assistant

## 2018-05-20 ENCOUNTER — Ambulatory Visit: Payer: Self-pay | Admitting: Physician Assistant

## 2018-05-21 ENCOUNTER — Ambulatory Visit: Payer: Self-pay | Admitting: Physician Assistant

## 2018-05-28 ENCOUNTER — Ambulatory Visit: Payer: Self-pay | Admitting: Physician Assistant

## 2018-05-28 ENCOUNTER — Encounter: Payer: Self-pay | Admitting: Gastroenterology

## 2018-05-28 ENCOUNTER — Ambulatory Visit (INDEPENDENT_AMBULATORY_CARE_PROVIDER_SITE_OTHER): Payer: Managed Care, Other (non HMO) | Admitting: Gastroenterology

## 2018-05-28 ENCOUNTER — Other Ambulatory Visit: Payer: Self-pay

## 2018-05-28 DIAGNOSIS — K219 Gastro-esophageal reflux disease without esophagitis: Secondary | ICD-10-CM | POA: Diagnosis not present

## 2018-05-28 DIAGNOSIS — K5900 Constipation, unspecified: Secondary | ICD-10-CM

## 2018-05-28 DIAGNOSIS — K5130 Ulcerative (chronic) rectosigmoiditis without complications: Secondary | ICD-10-CM | POA: Diagnosis not present

## 2018-05-28 DIAGNOSIS — R110 Nausea: Secondary | ICD-10-CM

## 2018-05-28 MED ORDER — LUBIPROSTONE 24 MCG PO CAPS: 24.0000 ug | ORAL_CAPSULE | Freq: Two times a day (BID) | ORAL | 3 refills | Status: DC

## 2018-05-28 MED ORDER — OMEPRAZOLE 20 MG PO CPDR: 20.0000 mg | DELAYED_RELEASE_CAPSULE | Freq: Every day | ORAL | 3 refills | Status: DC

## 2018-05-28 NOTE — Progress Notes (Signed)
Nicole Reeves    [id number]    Mar 23, 1985  Primary Care Physician:Patient, No Pcp Per  Referring Physician: No referring provider defined for this encounter.  This service was provided via telemedicine due to COVID 19.  Patient location: Home Provider location:Office Used 2 patient identifiers to confirm the correct person. Explained the limitations in evaluation and management via telemedicine. Patient is aware of potential medical charges for this visit.  Patient consented to this virtual visit (via telephone/webex).  The persons participating in this telemedicine service were myself and patient  Interactive audio and video telecommunications were attempted between this provider and patient, however failed, due to patient having technical difficulties OR patient did not have access to video capability. We continued and completed visit with audio only.  Time spent on call: 18 minutes  Chief complaint: Nausea, constipation  HPI: 33 year old female with left-sided ulcerative colitis here with complaints of constipation and intermittent nausea. She is on buprenorphine and feels it is making her constipated.  Her psychiatrist gave her samples of Amitiza 24 mcg twice daily, felt some improvement.  She is having bowel movement on most days once daily or every other day.  Denies any dark stool or blood in stool. nausea worse in the morning, after drinking coffee. She is having intermittent nausea, mostly early in the morning and sometimes worse after she drinks coffee.  Most days she does not eat breakfast drinks only coffee.  Denies any regurgitation, vomiting, abdominal pain, dysphagia or odynophagia.  She has intermittent heartburn and reflux symptoms.  She is not taking pantoprazole.  Colonoscopy December 11, 2017: Left-sided colitis with chronic changes, no active inflammation endoscopically.  Biopsies showed chronic active colitis in left colon.  She is taking Apriso  and budesonide daily   Outpatient Encounter Medications as of 05/28/2018  Medication Sig  . APRISO 0.375 g 24 hr capsule TAKE 4 CAPSULES BY MOUTH EVERY DAY *NEED OFFICE VISIT FOR REFILLS*  . ARIPiprazole (ABILIFY) 10 MG tablet Take 10 mg by mouth daily.  . budesonide (ENTOCORT EC) 3 MG 24 hr capsule TAKE 3 CAPSULES (9 MG TOTAL) BY MOUTH DAILY.  . buprenorphine (SUBUTEX) 8 MG SUBL SL tablet Place 8 mg under the tongue daily.   . diazepam (VALIUM) 10 MG tablet Take 25 mg by mouth daily as needed for anxiety.   . gabapentin (NEURONTIN) 300 MG capsule Take 900 mg by mouth 3 (three) times daily.  . Multiple Vitamin (MULTIVITAMIN WITH MINERALS) TABS tablet Take 1 tablet by mouth daily. (Patient taking differently: Take 1 tablet by mouth 2 (two) times a week. )  . ondansetron (ZOFRAN) 4 MG tablet Take 1 tablet by mouth every 6 hours as needed for nausea. (Patient taking differently: Take 4 mg by mouth every 6 (six) hours as needed for nausea or vomiting. )  . oxcarbazepine (TRILEPTAL) 600 MG tablet Take 1.5 tablets (900 mg total) by mouth at bedtime. (Patient taking differently: Take 1,200 mg by mouth at bedtime. )  . pantoprazole (PROTONIX) 40 MG tablet Take 40 mg by mouth daily.   . pregabalin (LYRICA) 100 MG capsule Take 100 mg by mouth 3 (three) times daily.   No facility-administered encounter medications on file as of 05/28/2018.     Allergies as of 05/28/2018  . (No Known Allergies)    Past Medical History:  Diagnosis Date  . Anxiety   . Colitis 11/2014   acute onset bloody diarrhea. C diff, O&P negative.   Marland Kitchen  Depression   . Hepatitis C 04/2016  . Left sided ulcerative colitis (HCC)   . Polysubstance abuse (HCC) 02/24/2014   tox screen + for THC, Barbiturates, amphetimine.    Past Surgical History:  Procedure Laterality Date  . FLEXIBLE SIGMOIDOSCOPY N/A 12/10/2014   Procedure: FLEXIBLE SIGMOIDOSCOPY;  Surgeon: Napoleon Form, MD;  Location: [facility];  Service: Endoscopy;   Laterality: N/A;    Family History  Problem Relation Age of Onset  . ADD / ADHD Sister   . Anxiety disorder Sister   . Drug abuse Paternal Aunt   . Alcohol abuse Maternal Grandfather   . Alcohol abuse Maternal Grandmother   . Alcohol abuse Paternal Grandfather   . Alcohol abuse Paternal Grandmother   . Anxiety disorder Paternal Grandmother   . Drug abuse Paternal Grandmother   . Anxiety disorder Sister   . Depression Sister   . Colon cancer Neg Hx   . Esophageal cancer Neg Hx   . Rectal cancer Neg Hx   . Stomach cancer Neg Hx   . Colon polyps Neg Hx     Social History   Socioeconomic History  . Marital status: Single    Spouse name: Not on file  . Number of children: 0  . Years of education: 16  . Highest education level: Bachelor's degree (e.g., BA, AB, BS)  Occupational History  . Occupation: Account Counsellor Rep    Comment: Network engineer  Social Needs  . Financial resource strain: Not hard at all  . Food insecurity:    Worry: Never true    Inability: Never true  . Transportation needs:    Medical: No    Non-medical: No  Tobacco Use  . Smoking status: Former Smoker    Packs/day: 0.25    Types: Cigarettes, E-cigarettes  . Smokeless tobacco: Never Used  . Tobacco comment: Reports desire to quite e-cigarettes   Substance and Sexual Activity  . Alcohol use: Not Currently    Alcohol/week: 0.0 standard drinks  . Drug use: Not Currently    Types: Marijuana, Heroin    Comment: not used since 02/2014  . Sexual activity: Yes    Partners: Female  Lifestyle  . Physical activity:    Days per week: 0 days    Minutes per session: Not on file  . Stress: To some extent  Relationships  . Social connections:    Talks on phone: More than three times a week    Gets together: Never    Attends religious service: More than 4 times per year    Active member of club or organization: No    Attends meetings of clubs or organizations: Never    Relationship status: Living  with partner  . Intimate partner violence:    Fear of current or ex partner: No    Emotionally abused: No    Physically abused: No    Forced sexual activity: No  Other Topics Concern  . Not on file  Social History Narrative   3-4 cups of coffee daily      Review of systems: Review of Systems as per HPI All other systems reviewed and are negative.   Physical Exam: Vitals were not taken and physical exam was not performed during this virtual visit.  Data Reviewed:  Reviewed labs, radiology imaging, old records and pertinent past GI work up   Assessment and Plan/Recommendations:  33 year old female with history of chronic left-sided ulcerative colitis and GERD with complaints of intermittent nausea and worsening  constipation  Amitiza 24 mcg twice daily, will send prescription for 90 days with refills Start Benefiber 1 teaspoon 2-3 times daily Increase dietary fiber and fluid intake  Nausea could be secondary to uncontrolled GERD Omeprazole 20 mg daily Antireflux measures  Ulcerative colitis: In clinical remission Continue Apriso Stop budesonide  Follow-up in 4 weeks or sooner if needed    K. Scherry Ran , MD   CC: No ref. provider found

## 2018-05-28 NOTE — Patient Instructions (Addendum)
Stop pantoprazole and Budesonide  Start omeprazole 20 mg daily  Antireflux measures  Amitiza 24 mcg twice daily, please send 90-day supply with 3 refills  Benefiber or equivalent bran based fiber 1 teaspoon 2-3 times daily with meals  Increase dietary fiber and fluid intake  Constipation handout  Follow-up virtual visit in 4 weeks     Constipation, Adult Constipation is when a person:  Poops (has a bowel movement) fewer times in a week than normal.  Has a hard time pooping.  Has poop that is dry, hard, or bigger than normal. Follow these instructions at home: Eating and drinking   Eat foods that have a lot of fiber, such as: ? Fresh fruits and vegetables. ? Whole grains. ? Beans.  Eat less of foods that are high in fat, low in fiber, or overly processed, such as: ? [place] fries. ? Hamburgers. ? Cookies. ? Candy. ? Soda.  Drink enough fluid to keep your pee (urine) clear or pale yellow. General instructions  Exercise regularly or as told by your doctor.  Go to the restroom when you feel like you need to poop. Do not hold it in.  Take over-the-counter and prescription medicines only as told by your doctor. These include any fiber supplements.  Do pelvic floor retraining exercises, such as: ? Doing deep breathing while relaxing your lower belly (abdomen). ? Relaxing your pelvic floor while pooping.  Watch your condition for any changes.  Keep all follow-up visits as told by your doctor. This is important. Contact a doctor if:  You have pain that gets worse.  You have a fever.  You have not pooped for 4 days.  You throw up (vomit).  You are not hungry.  You lose weight.  You are bleeding from the anus.  You have thin, pencil-like poop (stool). Get help right away if:  You have a fever, and your symptoms suddenly get worse.  You leak poop or have blood in your poop.  Your belly feels hard or bigger than normal (is bloated).  You have very  bad belly pain.  You feel dizzy or you faint. This information is not intended to replace advice given to you by your health care provider. Make sure you discuss any questions you have with your health care provider. Document Released: 08/01/2007 Document Revised: 09/02/2015 Document Reviewed: 08/03/2015 Elsevier Interactive Patient Education  2019 [employer].

## 2018-05-29 ENCOUNTER — Ambulatory Visit: Payer: Self-pay | Admitting: Internal Medicine

## 2018-06-13 ENCOUNTER — Other Ambulatory Visit: Payer: Self-pay | Admitting: Gastroenterology

## 2018-08-09 ENCOUNTER — Other Ambulatory Visit: Payer: Self-pay | Admitting: Obstetrics & Gynecology

## 2018-08-11 ENCOUNTER — Other Ambulatory Visit: Payer: Self-pay | Admitting: Gastroenterology

## 2018-12-24 ENCOUNTER — Ambulatory Visit: Payer: Managed Care, Other (non HMO) | Admitting: Physician Assistant

## 2019-01-07 ENCOUNTER — Ambulatory Visit: Payer: Managed Care, Other (non HMO) | Admitting: Physician Assistant

## 2019-01-12 ENCOUNTER — Telehealth: Payer: Self-pay | Admitting: Gastroenterology

## 2019-01-12 NOTE — Telephone Encounter (Signed)
Left message for patient to call back  

## 2019-01-12 NOTE — Telephone Encounter (Signed)
Patient reports that she had vomiting for the last 2 days.  She has been weaning off of her cymbalta and Subutex and is wondering if she is having "withdrawal symptoms from those".  She is advised that she should start a clear liquid diet for the next 12-24 hours then slowly advance her diet as tolerated.  She has a hx of UC and for the last few weeks she has had some intermittent cramping and diarrhea as well as constipation.  These symptoms have lessened the last week, but she is wondering if her UC is not well controlled.  She will come in and see [name], PA on 01/20/19 1:30.  She will call back sooner if her symptoms worsen.

## 2019-01-12 NOTE — Telephone Encounter (Signed)
Patient notified  She reports she has been having some nasal congestion.  She is going to have a COVID test today.  She will cancel her appt for next week if she is COVID positive

## 2019-01-12 NOTE — Telephone Encounter (Signed)
Yes her symptoms possibly could be due to rapid taper of Subutex.  Please advise her to contact her prescribing provider.  Follow-up in office as scheduled for UC.  Thank you

## 2019-01-18 ENCOUNTER — Emergency Department (HOSPITAL_COMMUNITY)
Admission: EM | Admit: 2019-01-18 | Discharge: 2019-01-19 | Payer: Managed Care, Other (non HMO) | Attending: Emergency Medicine | Admitting: Emergency Medicine

## 2019-01-18 ENCOUNTER — Encounter (HOSPITAL_COMMUNITY): Payer: Self-pay | Admitting: Emergency Medicine

## 2019-01-18 ENCOUNTER — Other Ambulatory Visit: Payer: Self-pay

## 2019-01-18 DIAGNOSIS — Z79899 Other long term (current) drug therapy: Secondary | ICD-10-CM | POA: Diagnosis not present

## 2019-01-18 DIAGNOSIS — R1084 Generalized abdominal pain: Secondary | ICD-10-CM | POA: Diagnosis not present

## 2019-01-18 DIAGNOSIS — Z20828 Contact with and (suspected) exposure to other viral communicable diseases: Secondary | ICD-10-CM | POA: Diagnosis not present

## 2019-01-18 DIAGNOSIS — Z87891 Personal history of nicotine dependence: Secondary | ICD-10-CM | POA: Insufficient documentation

## 2019-01-18 DIAGNOSIS — B182 Chronic viral hepatitis C: Secondary | ICD-10-CM | POA: Insufficient documentation

## 2019-01-18 DIAGNOSIS — Z20822 Contact with and (suspected) exposure to covid-19: Secondary | ICD-10-CM

## 2019-01-18 DIAGNOSIS — R112 Nausea with vomiting, unspecified: Secondary | ICD-10-CM | POA: Diagnosis not present

## 2019-01-18 LAB — POC SARS CORONAVIRUS 2 AG -  ED: SARS Coronavirus 2 Ag: NEGATIVE

## 2019-01-18 NOTE — ED Notes (Signed)
Patient refused blood specimen collection at triage .  

## 2019-01-18 NOTE — ED Triage Notes (Signed)
Patient arrived with 2 [employer] officers from jail , patient reports positive Covid test Tuesday last week from [facility] and generalized abdominal pain for 1 1/2 with emesis and diarrhea , denies fever or chills .
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
## 2019-01-19 NOTE — Discharge Instructions (Signed)
The rapid covid test was negative. We have sent out an additional test that takes 24-48 hours to return. Patient will be contacted regarding results.

## 2019-01-20 ENCOUNTER — Ambulatory Visit: Payer: Managed Care, Other (non HMO) | Admitting: Physician Assistant

## 2019-01-20 LAB — NOVEL CORONAVIRUS, NAA (HOSP ORDER, SEND-OUT TO REF LAB; TAT 18-24 HRS): SARS-CoV-2, NAA: NOT DETECTED

## 2019-03-18 ENCOUNTER — Telehealth (HOSPITAL_COMMUNITY): Payer: Self-pay | Admitting: Professional

## 2019-03-21 NOTE — Progress Notes (Deleted)
     03/21/2019 Zeniya Lapidus [id number] 1985-06-13   Chief Complaint:  History of Present Illness: Persephonie Hegwood is a 34 year old female with a past medical history of anxiety, depression, chronic hepatitis C, erythema nodosum, left sided ulcerative colitis diagnosed ... on Apriso, ?  Budesonide.   Labs 2019 showed mild normocytic anemia. HCV RNA undetectable 02/2017. Needs labs    Colonoscopy 12/11/2017 by Dr. Silverio Decamp: - Preparation of the colon was fair. - Mild (Mayo Score 1) left-sided ulcerative colitis, in remission since the last examination. Biopsied. -Recall colonoscopy 10 years Biopsies: 1. Surgical [P], right colon - CHRONIC COLITIS WITH FOCAL ACTIVITY - NO GRANULOMATA, DYSPLASIA OR MALIGNANCY IDENTIFIED - SEE COMMENT 2. Surgical [P], left colon BX - CHRONIC INACTIVE COLITIS - NO GRANULOMATA, DYSPLASIA OR MALIGNANCY IDENTIFIED - SEE COMMENT  Colonoscopy 03/14/2015: Active colitis with aphthous ulcers in sigmoid and descending colon.  ULTRASOUND ABDOMEN 07/18/2016: No acute findings  ULTRASOUND HEPATIC ELASTOGRAPHY 07/18/2016: Median hepatic shear wave velocity is calculated at 1.17 m/sec. Corresponding Metavir fibrosis score is  F0/F1. Risk of fibrosis is Minimal   Past Medical History:  Diagnosis Date  . Anxiety   . Colitis 11/2014   acute onset bloody diarrhea. C diff, O&P negative.   . Depression   . Hepatitis C 04/2016  . Left sided ulcerative colitis ([place])   . Polysubstance abuse ([place]) 02/24/2014   tox screen + for THC, Barbiturates, amphetimine.      Current Medications, Allergies, Past Medical History, Past Surgical History, Family History and Social History were reviewed in [employer] record.   Physical Exam: There were no vitals taken for this visit. General: Well developed 34 year old female in no acute distress Head: Normocephalic and atraumatic Eyes:  sclerae anicteric, conjunctiva pink  Ears:  Normal auditory acuity Lungs: Clear throughout to auscultation Heart: Regular rate and rhythm Abdomen: Soft, non tender and non distended. No masses, no hepatomegaly. Normal bowel sounds Rectal: *** Musculoskeletal: Symmetrical with no gross deformities  Extremities: No edema  Neurological: Alert oriented x 4, grossly nonfocal Psychological:  Alert and cooperative. Normal mood and affect  Assessment and Recommendations: ***

## 2019-03-23 ENCOUNTER — Ambulatory Visit: Payer: Managed Care, Other (non HMO) | Admitting: Nurse Practitioner

## 2019-07-30 ENCOUNTER — Emergency Department (HOSPITAL_COMMUNITY)
Admission: EM | Admit: 2019-07-30 | Discharge: 2019-07-30 | Payer: Managed Care, Other (non HMO) | Attending: Emergency Medicine | Admitting: Emergency Medicine

## 2019-07-30 ENCOUNTER — Encounter (HOSPITAL_COMMUNITY): Payer: Self-pay | Admitting: Obstetrics and Gynecology

## 2019-07-30 ENCOUNTER — Other Ambulatory Visit: Payer: Self-pay

## 2019-07-30 DIAGNOSIS — Z87891 Personal history of nicotine dependence: Secondary | ICD-10-CM | POA: Insufficient documentation

## 2019-07-30 DIAGNOSIS — Z20822 Contact with and (suspected) exposure to covid-19: Secondary | ICD-10-CM | POA: Insufficient documentation

## 2019-07-30 DIAGNOSIS — R251 Tremor, unspecified: Secondary | ICD-10-CM | POA: Insufficient documentation

## 2019-07-30 DIAGNOSIS — F1920 Other psychoactive substance dependence, uncomplicated: Secondary | ICD-10-CM

## 2019-07-30 DIAGNOSIS — Z79899 Other long term (current) drug therapy: Secondary | ICD-10-CM | POA: Insufficient documentation

## 2019-07-30 LAB — CBC
HCT: 39.2 % (ref 36.0–46.0)
Hemoglobin: 13.1 g/dL (ref 12.0–15.0)
MCH: 31.3 pg (ref 26.0–34.0)
MCHC: 33.4 g/dL (ref 30.0–36.0)
MCV: 93.6 fL (ref 80.0–100.0)
Platelets: 305 10*3/uL (ref 150–400)
RBC: 4.19 MIL/uL (ref 3.87–5.11)
RDW: 12.4 % (ref 11.5–15.5)
WBC: 8.6 10*3/uL (ref 4.0–10.5)
nRBC: 0 % (ref 0.0–0.2)

## 2019-07-30 LAB — COMPREHENSIVE METABOLIC PANEL
ALT: 15 U/L (ref 0–44)
AST: 14 U/L — ABNORMAL LOW (ref 15–41)
Albumin: 3.6 g/dL (ref 3.5–5.0)
Alkaline Phosphatase: 64 U/L (ref 38–126)
Anion gap: 9 (ref 5–15)
BUN: 7 mg/dL (ref 6–20)
CO2: 28 mmol/L (ref 22–32)
Calcium: 8.9 mg/dL (ref 8.9–10.3)
Chloride: 105 mmol/L (ref 98–111)
Creatinine, Ser: 0.4 mg/dL — ABNORMAL LOW (ref 0.44–1.00)
GFR calc Af Amer: >60 mL/min (ref >60)
GFR calc non Af Amer: >60 mL/min (ref >60)
Glucose, Bld: 85 mg/dL (ref 70–99)
Potassium: 3.8 mmol/L (ref 3.5–5.1)
Sodium: 142 mmol/L (ref 135–145)
Total Bilirubin: 0.3 mg/dL (ref 0.3–1.2)
Total Protein: 7.1 g/dL (ref 6.5–8.1)

## 2019-07-30 LAB — RAPID URINE DRUG SCREEN, HOSP PERFORMED
Amphetamines: POSITIVE — AB
Barbiturates: NOT DETECTED
Benzodiazepines: POSITIVE — AB
Cocaine: NOT DETECTED
Opiates: NOT DETECTED
Tetrahydrocannabinol: POSITIVE — AB

## 2019-07-30 LAB — SARS CORONAVIRUS 2 BY RT PCR (HOSPITAL ORDER, PERFORMED IN ~~LOC~~ HOSPITAL LAB): SARS Coronavirus 2: NEGATIVE

## 2019-07-30 LAB — SALICYLATE LEVEL: Salicylate Lvl: <7 mg/dL — ABNORMAL LOW (ref 7.0–30.0)

## 2019-07-30 LAB — ACETAMINOPHEN LEVEL: Acetaminophen (Tylenol), Serum: <10 ug/mL — ABNORMAL LOW (ref 10–30)

## 2019-07-30 LAB — POC URINE PREG, ED: Preg Test, Ur: NEGATIVE

## 2019-07-30 LAB — ETHANOL: Alcohol, Ethyl (B): <10 mg/dL (ref <10)

## 2019-07-30 MED ORDER — DIAZEPAM 5 MG PO TABS
10.0000 mg | ORAL_TABLET | Freq: Once | ORAL | Status: AC
  Administered 2019-07-30: 10 mg via ORAL
  Filled 2019-07-30: qty 2

## 2019-07-30 NOTE — ED Provider Notes (Signed)
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 Remote hx of heroine use.   Pt wants to detox from benzos.  Last took 48 hrs.  Awaits labs for medical clearance  6:18 PM Pt is medically cleared and stable for discharge.  An outpt placement have been made available   BP 115/86 (BP Location: Left Arm)   Pulse 99   Temp 98.8 F (37.1 C) (Oral)   Resp 16   LMP 06/29/2019   SpO2 99%   Results for orders placed or performed during the hospital encounter of 07/30/19  SARS Coronavirus 2 by RT PCR (hospital order, performed in [place] hospital lab) Nasopharyngeal Nasopharyngeal Swab   Specimen: Nasopharyngeal Swab  Result Value Ref Range   SARS Coronavirus 2 NEGATIVE NEGATIVE  Comprehensive metabolic panel  Result Value Ref Range   Sodium 142 135 - 145 mmol/L   Potassium 3.8 3.5 - 5.1 mmol/L   Chloride 105 98 - 111 mmol/L   CO2 28 22 - 32 mmol/L   Glucose, Bld 85 70 - 99 mg/dL   BUN 7 6 - 20 mg/dL   Creatinine, Ser 0.40 (L) 0.44 - 1.00 mg/dL   Calcium 8.9 8.9 - 10.3 mg/dL   Total Protein 7.1 6.5 - 8.1 g/dL   Albumin 3.6 3.5 - 5.0 g/dL   AST 14 (L) 15 - 41 U/L   ALT 15 0 - 44 U/L   Alkaline Phosphatase 64 38 - 126 U/L   Total Bilirubin 0.3 0.3 - 1.2 mg/dL   GFR calc non Af Amer >60 >60 mL/min   GFR calc Af Amer >60 >60 mL/min   Anion gap 9 5 - 15  Ethanol  Result Value Ref Range   Alcohol, Ethyl (B) <10 <10 mg/dL  cbc  Result Value Ref Range   WBC 8.6 4.0 - 10.5 K/uL   RBC 4.19 3.87 - 5.11 MIL/uL   Hemoglobin 13.1 12.0 - 15.0 g/dL   HCT 39.2 36.0 - 46.0 %   MCV 93.6 80.0 - 100.0 fL   MCH 31.3 26.0 - 34.0 pg   MCHC 33.4 30.0 - 36.0 g/dL   RDW 12.4 11.5 - 15.5 %   Platelets 305 150 - 400 K/uL   nRBC 0.0 0.0 - 0.2 %  Rapid urine drug screen (hospital performed)  Result Value Ref Range   Opiates NONE DETECTED NONE DETECTED   Cocaine NONE DETECTED NONE DETECTED   Benzodiazepines POSITIVE (A) NONE DETECTED   Amphetamines POSITIVE (A) NONE DETECTED   Tetrahydrocannabinol POSITIVE (A) NONE DETECTED   Barbiturates NONE DETECTED NONE DETECTED  Salicylate level  Result Value Ref Range   Salicylate Lvl <7.0 (L) 7.0 - 30.0 mg/dL  Acetaminophen level  Result Value Ref Range   Acetaminophen (Tylenol), Serum <10 (L) 10 - 30 ug/mL  POC Urine Pregnancy, ED (not at [facility])  Result Value Ref Range   Preg Test, Ur NEGATIVE NEGATIVE   No results found.    Domenic Moras, PA-C 07/30/19 1818    Isla Pence, MD 07/30/19 2002
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
## 2019-07-30 NOTE — Patient Outreach (Signed)
CPSS was able to get consent for signed an faxed her information to [facility]. CPSS processed step by step with Pt an are waiting for reply back from facility.

## 2019-07-30 NOTE — Patient Outreach (Signed)
CPSS contacted [facility] an was able to complete a phone assessment an was accepted. Pt is waiting to hear back in regards to transportation.
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
## 2020-02-09 ENCOUNTER — Other Ambulatory Visit: Payer: Self-pay | Admitting: Gastroenterology

## 2020-10-12 ENCOUNTER — Ambulatory Visit: Payer: 59 | Admitting: Physician Assistant

## 2020-10-20 ENCOUNTER — Ambulatory Visit (INDEPENDENT_AMBULATORY_CARE_PROVIDER_SITE_OTHER): Payer: 59 | Admitting: Physician Assistant

## 2020-10-20 ENCOUNTER — Encounter: Payer: Self-pay | Admitting: Physician Assistant

## 2020-10-20 VITALS — BP 122/82 | HR 105 | Ht 67.0 in | Wt 149.0 lb

## 2020-10-20 DIAGNOSIS — K51919 Ulcerative colitis, unspecified with unspecified complications: Secondary | ICD-10-CM

## 2020-10-20 MED ORDER — MESALAMINE ER 0.375 G PO CP24: 1500.0000 mg | ORAL_CAPSULE | Freq: Every day | ORAL | 11 refills | Status: DC

## 2020-10-20 MED ORDER — BUDESONIDE 3 MG PO CPEP: ORAL_CAPSULE | ORAL | 0 refills | Status: AC

## 2020-10-20 NOTE — Patient Instructions (Addendum)
Your provider has requested that you go to the basement level for lab work before leaving today. Press "B" on the elevator. The lab is located at the first door on the left as you exit the elevator.  We have sent the following medications to your pharmacy for you to pick up at your convenience: Mesalamine 4 capsules daily.  Budesonide taper 9 mg daily for 4 weeks, then 6 mg daily for 2 weeks, then 3 mg daily for 2 weeks, then stop.  Follow up in 2-3 months.  If you are age 34 or older, your body mass index should be between 23-30. Your Body mass index is 23.34 kg/m. If this is out of the aforementioned range listed, please consider follow up with your Primary Care Provider.  If you are age 12 or younger, your body mass index should be between 19-25. Your Body mass index is 23.34 kg/m. If this is out of the aformentioned range listed, please consider follow up with your Primary Care Provider.   __________________________________________________________  The [facility] providers would like to encourage you to use [facility] to communicate with providers for non-urgent requests or questions.  Due to long hold times on the telephone, sending your provider a message by [facility] may be a faster and more efficient way to get a response.  Please allow 48 business hours for a response.  Please remember that this is for non-urgent requests.

## 2020-10-20 NOTE — Progress Notes (Signed)
Chief Complaint: "Flare of ulcerative colitis"  HPI:    Nicole Reeves is a 35 year old female with a past medical history of ulcerative colitis and others listed below, known to Dr. Silverio Decamp, who presents to clinic today with a complaint of "flare of ulcerative colitis".    12/11/2017 left-sided colitis with chronic changes, no active inflammation endoscopically.  Biopsy showed chronic active colitis in the left colon.    05/28/2018 patient seen via telemedicine.  At that time noted patient history of left-sided ulcerative colitis and was complaining of constipation and intermittent nausea.  At that time on buprenorphine and felt like it is making her constipated.  Her psychiatrist is giving her samples of Amitiza 24 mcg twice daily which she felt improved things.  At that time on Apriso and budesonide daily at that time prescribed Amitiza 24 mcg twice a day and told the patient to start Benefiber 1 teaspoon 2-3 times daily.  Was sent nausea could be secondary to uncontrolled GERD and she is started on Omeprazole 20 mg daily.  Her budesonide was stopped and she was continued on Apriso.    Today, the patient explains that since being seen here last she quit her job and lost her insurance.  Due to this could not afford any medications so has not been on anything for her ulcerative colitis over the past year.  Now she is in a flare which she blames on stress.  Tells me she is seeing her therapist in order to help with this but is having 15-20 loose stools a day or at least "the urge to go", and often these are accompanied by blood.  She can still eat and has an appetite and yesterday had her first formed stool and this morning had another.  Patient tells me that she knows she is in a flare but it "is not bad enough to be in the hospital".  Tells me she would like to get started back on her medicine now that she has a job.    Denies fever, chills, weight loss or symptoms that awaken her from sleep.  Past  Medical History:  Diagnosis Date   Anxiety    Colitis 11/2014   acute onset bloody diarrhea. C diff, O&P negative.    Depression    Hepatitis C 04/2016   Left sided ulcerative colitis ([place])    Polysubstance abuse ([place]) 02/24/2014   tox screen + for THC, Barbiturates, amphetimine.    Past Surgical History:  Procedure Laterality Date   FLEXIBLE SIGMOIDOSCOPY N/A 12/10/2014   Procedure: FLEXIBLE SIGMOIDOSCOPY;  Surgeon: Mauri Pole, MD;  Location: [facility];  Service: Endoscopy;  Laterality: N/A;    Current Outpatient Medications  Medication Sig Dispense Refill   ARIPiprazole (ABILIFY) 10 MG tablet Take 10 mg by mouth daily.     budesonide (ENTOCORT EC) 3 MG 24 hr capsule TAKE 3 CAPSULES (9 MG TOTAL) BY MOUTH DAILY. 270 capsule 0   buprenorphine (SUBUTEX) 8 MG SUBL SL tablet Place 8 mg under the tongue daily.   0   diazepam (VALIUM) 10 MG tablet Take 25 mg by mouth daily as needed for anxiety.   1   gabapentin (NEURONTIN) 300 MG capsule Take 900 mg by mouth 3 (three) times daily.     lubiprostone (AMITIZA) 24 MCG capsule Take 1 capsule (24 mcg total) by mouth 2 (two) times daily with a meal. 180 capsule 3   mesalamine (APRISO) 0.375 g 24 hr capsule TAKE 4 CAPSULES BY MOUTH  EVERY DAY *NEED OFFICE VISIT FOR REFILLS* 360 capsule 1   Multiple Vitamin (MULTIVITAMIN WITH MINERALS) TABS tablet Take 1 tablet by mouth daily. (Patient taking differently: Take 1 tablet by mouth 2 (two) times a week. ) 30 tablet 0   omeprazole (PRILOSEC) 20 MG capsule Take 1 capsule (20 mg total) by mouth daily. 90 capsule 3   ondansetron (ZOFRAN) 4 MG tablet Take 1 tablet by mouth every 6 hours as needed for nausea. (Patient taking differently: Take 4 mg by mouth every 6 (six) hours as needed for nausea or vomiting. ) 40 tablet 0   oxcarbazepine (TRILEPTAL) 600 MG tablet Take 1.5 tablets (900 mg total) by mouth at bedtime. (Patient taking differently: Take 1,200 mg by mouth at bedtime. ) 135 tablet 0    pantoprazole (PROTONIX) 40 MG tablet Take 40 mg by mouth daily.      pregabalin (LYRICA) 100 MG capsule Take 100 mg by mouth 3 (three) times daily.     No current facility-administered medications for this visit.    Allergies as of 10/20/2020   (No Known Allergies)    Family History  Problem Relation Age of Onset   ADD / ADHD Sister    Anxiety disorder Sister    Drug abuse Paternal Aunt    Alcohol abuse Maternal Grandfather    Alcohol abuse Maternal Grandmother    Alcohol abuse Paternal Grandfather    Alcohol abuse Paternal Grandmother    Anxiety disorder Paternal Grandmother    Drug abuse Paternal Grandmother    Anxiety disorder Sister    Depression Sister    Colon cancer Neg Hx    Esophageal cancer Neg Hx    Rectal cancer Neg Hx    Stomach cancer Neg Hx    Colon polyps Neg Hx     Social History   Socioeconomic History   Marital status: Significant Other    Spouse name: Not on file   Number of children: 0   Years of education: 16   Highest education level: Bachelor's degree (e.g., BA, AB, BS)  Occupational History   Occupation: Account Development Rep    Comment: [employer]  Tobacco Use   Smoking status: Former    Packs/day: 0.25    Types: Cigarettes, E-cigarettes   Smokeless tobacco: Never   Tobacco comments:    Reports desire to quite e-cigarettes   Vaping Use   Vaping Use: Every day  Substance and Sexual Activity   Alcohol use: Not Currently    Alcohol/week: 0.0 standard drinks   Drug use: Not Currently    Types: Marijuana, Heroin    Comment: not used since 02/2014   Sexual activity: Yes    Partners: Female  Other Topics Concern   Not on file  Social History Narrative   3-4 cups of coffee daily   Social Determinants of Health   Financial Resource Strain: Not on file  Food Insecurity: Not on file  Transportation Needs: Not on file  Physical Activity: Not on file  Stress: Not on file  Social Connections: Not on file  Intimate Partner  Violence: Not on file    Review of Systems:    Constitutional: No weight loss, fever or chills Cardiovascular: No chest pain Respiratory: No SOB Gastrointestinal: See HPI and otherwise negative   Physical Exam:  Vital signs: BP 122/82   Pulse (!) 105   Ht 5' 7"  (1.702 m)   Wt 149 lb (67.6 kg)   BMI 23.34 kg/m   Constitutional:  Pleasant Caucasian female appears to be in NAD, Well developed, Well nourished, alert and cooperative Head:  Normocephalic and atraumatic. Eyes:   PEERL, EOMI. No icterus. Conjunctiva pink. Ears:  Normal auditory acuity. Neck:  Supple Throat: Oral cavity and pharynx without inflammation, swelling or lesion.  Respiratory: Respirations even and unlabored. Lungs clear to auscultation bilaterally.   No wheezes, crackles, or rhonchi.  Cardiovascular: Normal S1, S2. No MRG. Regular rate and rhythm. No peripheral edema, cyanosis or pallor.  Gastrointestinal:  Soft, nondistended, nontender. No rebound or guarding. Normal bowel sounds. No appreciable masses or hepatomegaly. Rectal:  Not performed.  Msk:  Symmetrical without gross deformities. Without edema, no deformity or joint abnormality.  Neurologic:  Alert and  oriented x4;  grossly normal neurologically.  Skin:   Dry and intact without significant lesions or rashes. Psychiatric: Demonstrates good judgement and reason without abnormal affect or behaviors.  No recent labs.  Assessment: 1.  Ulcerative colitis with complication: Patient has been off of her Apriso for the past year, now in a flare over the past month or so with diarrhea and hematochezia as well as some generalized cramping before bowel movements  Plan: 1.  Discussed with patient that ideally we would start her on Prednisone with a taper but she would prefer to avoid this medication as it "messes with my head".  We will try Budesonide 9 mg x 4 weeks, then 6 mg x 2 weeks, then 3 mg x 2 weeks instead as this is known to have less systemic side  effects. 2.  Refilled patient's Apriso and instructed her to restart this now. 3.  Ordered CBC and CMP as well as ESR and CRP 4.  Patient to follow in clinic with me in 2 to 3 months.  Nicole Newer, PA-C [facility] 10/20/2020, 3:03 PM

## 2020-10-24 NOTE — Progress Notes (Signed)
Reviewed and agree with documentation and assessment and plan. K. Veena Myrella Fahs , MD   

## 2020-11-16 ENCOUNTER — Other Ambulatory Visit: Payer: Self-pay

## 2020-11-23 ENCOUNTER — Other Ambulatory Visit: Payer: Self-pay | Admitting: Physician Assistant

## 2020-12-06 ENCOUNTER — Ambulatory Visit: Payer: 59 | Admitting: Physician Assistant

## 2020-12-14 ENCOUNTER — Emergency Department (HOSPITAL_COMMUNITY)
Admission: EM | Admit: 2020-12-14 | Discharge: 2020-12-14 | Disposition: A | Discharge status: Home / Self Care | Payer: 59 | Attending: Emergency Medicine | Admitting: Emergency Medicine

## 2020-12-14 ENCOUNTER — Encounter (HOSPITAL_COMMUNITY): Payer: Self-pay | Admitting: Emergency Medicine

## 2020-12-14 ENCOUNTER — Emergency Department (HOSPITAL_COMMUNITY)
Admission: EM | Admit: 2020-12-14 | Discharge: 2020-12-15 | Disposition: A | Discharge status: Home / Self Care | Payer: Self-pay | Attending: Emergency Medicine | Admitting: Emergency Medicine

## 2020-12-14 ENCOUNTER — Emergency Department (HOSPITAL_COMMUNITY)
Admission: EM | Admit: 2020-12-14 | Discharge: 2020-12-14 | Disposition: A | Discharge status: Home / Self Care | Payer: Medicaid Other | Attending: Emergency Medicine | Admitting: Emergency Medicine

## 2020-12-14 ENCOUNTER — Other Ambulatory Visit: Payer: Self-pay

## 2020-12-14 DIAGNOSIS — Z87891 Personal history of nicotine dependence: Secondary | ICD-10-CM | POA: Insufficient documentation

## 2020-12-14 DIAGNOSIS — G4700 Insomnia, unspecified: Secondary | ICD-10-CM | POA: Insufficient documentation

## 2020-12-14 DIAGNOSIS — R519 Headache, unspecified: Secondary | ICD-10-CM | POA: Insufficient documentation

## 2020-12-14 DIAGNOSIS — Z5321 Procedure and treatment not carried out due to patient leaving prior to being seen by health care provider: Secondary | ICD-10-CM | POA: Insufficient documentation

## 2020-12-14 DIAGNOSIS — Z79899 Other long term (current) drug therapy: Secondary | ICD-10-CM | POA: Insufficient documentation

## 2020-12-14 DIAGNOSIS — Y900 Blood alcohol level of less than 20 mg/100 ml: Secondary | ICD-10-CM | POA: Insufficient documentation

## 2020-12-14 LAB — CBC WITH DIFFERENTIAL/PLATELET
Abs Immature Granulocytes: 0.02 10*3/uL (ref 0.00–0.07)
Basophils Absolute: 0.1 10*3/uL (ref 0.0–0.1)
Basophils Relative: 1 %
Eosinophils Absolute: 0.1 10*3/uL (ref 0.0–0.5)
Eosinophils Relative: 2 %
HCT: 38.3 % (ref 36.0–46.0)
Hemoglobin: 13.1 g/dL (ref 12.0–15.0)
Immature Granulocytes: 0 %
Lymphocytes Relative: 31 %
Lymphs Abs: 2.3 10*3/uL (ref 0.7–4.0)
MCH: 31.3 pg (ref 26.0–34.0)
MCHC: 34.2 g/dL (ref 30.0–36.0)
MCV: 91.4 fL (ref 80.0–100.0)
Monocytes Absolute: 0.7 10*3/uL (ref 0.1–1.0)
Monocytes Relative: 9 %
Neutro Abs: 4.4 10*3/uL (ref 1.7–7.7)
Neutrophils Relative %: 57 %
Platelets: 328 10*3/uL (ref 150–400)
RBC: 4.19 MIL/uL (ref 3.87–5.11)
RDW: 12.9 % (ref 11.5–15.5)
WBC: 7.5 10*3/uL (ref 4.0–10.5)
nRBC: 0 % (ref 0.0–0.2)

## 2020-12-14 LAB — CBC
HCT: 40.3 % (ref 36.0–46.0)
Hemoglobin: 13.7 g/dL (ref 12.0–15.0)
MCH: 31.5 pg (ref 26.0–34.0)
MCHC: 34 g/dL (ref 30.0–36.0)
MCV: 92.6 fL (ref 80.0–100.0)
Platelets: 335 10*3/uL (ref 150–400)
RBC: 4.35 MIL/uL (ref 3.87–5.11)
RDW: 13 % (ref 11.5–15.5)
WBC: 8.7 10*3/uL (ref 4.0–10.5)
nRBC: 0 % (ref 0.0–0.2)

## 2020-12-14 LAB — COMPREHENSIVE METABOLIC PANEL
ALT: 14 U/L (ref 0–44)
ALT: 13 U/L (ref 0–44)
AST: 18 U/L (ref 15–41)
AST: 16 U/L (ref 15–41)
Albumin: 3.9 g/dL (ref 3.5–5.0)
Albumin: 3.5 g/dL (ref 3.5–5.0)
Alkaline Phosphatase: 57 U/L (ref 38–126)
Alkaline Phosphatase: 53 U/L (ref 38–126)
Anion gap: 10 (ref 5–15)
Anion gap: 8 (ref 5–15)
BUN: 10 mg/dL (ref 6–20)
BUN: 11 mg/dL (ref 6–20)
CO2: 21 mmol/L — ABNORMAL LOW (ref 22–32)
CO2: 22 mmol/L (ref 22–32)
Calcium: 9.2 mg/dL (ref 8.9–10.3)
Calcium: 9.1 mg/dL (ref 8.9–10.3)
Chloride: 106 mmol/L (ref 98–111)
Chloride: 106 mmol/L (ref 98–111)
Creatinine, Ser: 0.69 mg/dL (ref 0.44–1.00)
Creatinine, Ser: 0.51 mg/dL (ref 0.44–1.00)
GFR, Estimated: >60 mL/min (ref >60)
GFR, Estimated: >60 mL/min (ref >60)
Glucose, Bld: 100 mg/dL — ABNORMAL HIGH (ref 70–99)
Glucose, Bld: 90 mg/dL (ref 70–99)
Potassium: 3.6 mmol/L (ref 3.5–5.1)
Potassium: 3.3 mmol/L — ABNORMAL LOW (ref 3.5–5.1)
Sodium: 137 mmol/L (ref 135–145)
Sodium: 136 mmol/L (ref 135–145)
Total Bilirubin: 0.7 mg/dL (ref 0.3–1.2)
Total Bilirubin: 0.4 mg/dL (ref 0.3–1.2)
Total Protein: 7.4 g/dL (ref 6.5–8.1)
Total Protein: 6.7 g/dL (ref 6.5–8.1)

## 2020-12-14 LAB — RAPID URINE DRUG SCREEN, HOSP PERFORMED
Amphetamines: NOT DETECTED
Amphetamines: NOT DETECTED
Barbiturates: NOT DETECTED
Barbiturates: NOT DETECTED
Benzodiazepines: POSITIVE — AB
Benzodiazepines: POSITIVE — AB
Cocaine: NOT DETECTED
Cocaine: NOT DETECTED
Opiates: NOT DETECTED
Opiates: NOT DETECTED
Tetrahydrocannabinol: POSITIVE — AB
Tetrahydrocannabinol: POSITIVE — AB

## 2020-12-14 LAB — I-STAT BETA HCG BLOOD, ED (MC, WL, AP ONLY): I-stat hCG, quantitative: <5 m[IU]/mL (ref <5)

## 2020-12-14 LAB — ETHANOL
Alcohol, Ethyl (B): <10 mg/dL (ref <10)
Alcohol, Ethyl (B): <10 mg/dL (ref <10)

## 2020-12-14 MED ORDER — HYDROXYZINE HCL 25 MG PO TABS: 25.0000 mg | ORAL_TABLET | Freq: Four times a day (QID) | ORAL | 0 refills | Status: DC

## 2020-12-14 MED ORDER — METOCLOPRAMIDE HCL 10 MG PO TABS
10.0000 mg | ORAL_TABLET | Freq: Once | ORAL | Status: DC
  Filled 2020-12-14: qty 1

## 2020-12-14 MED ORDER — ACETAMINOPHEN 500 MG PO TABS
1000.0000 mg | ORAL_TABLET | Freq: Once | ORAL | Status: DC
  Filled 2020-12-14: qty 2

## 2020-12-14 MED ORDER — DIPHENHYDRAMINE HCL 25 MG PO CAPS
25.0000 mg | ORAL_CAPSULE | Freq: Once | ORAL | Status: DC
  Filled 2020-12-14: qty 1

## 2020-12-14 MED ORDER — MAGNESIUM SULFATE 2 GM/50ML IV SOLN
2.0000 g | Freq: Once | INTRAVENOUS | Status: AC
  Administered 2020-12-15: 2 g via INTRAVENOUS
  Filled 2020-12-14: qty 50

## 2020-12-14 NOTE — ED Triage Notes (Signed)
Pt c/o headache and being unable to sleep x 2 days. Also states she is coming off several prescribed medications.

## 2020-12-14 NOTE — ED Provider Notes (Signed)
Emergency Medicine Provider Triage Evaluation Note  Nicole Reeves , a 35 y.o. female  was evaluated in triage.  Pt complains of insomnia.  Patient reports that she has a history of bipolar disorder and substance use disorder.  States that she has had insomnia over the last few days.  States that the Seroquel she is taking is no longer working.  Patient denies any SI, HI, AVH.   Patient endorses marijuana use, states that she has not used marijuana in 2 weeks.  Denies any other illicit drug use.  Denies any alcohol use.  Review of Systems  Positive: Insomnia Negative: SI, HI, AVH  Physical Exam  BP 125/89 (BP Location: Left Arm)   Pulse 86   Temp 98 F (36.7 C)   Resp 16   LMP 12/13/2020 (Exact Date)   SpO2 100%  Gen:   Awake, no distress   Resp:  Normal effort  MSK:   Moves extremities without difficulty  Other:    Medical Decision Making  Medically screening exam initiated at 5:33 PM.  Appropriate orders placed.  Sonika Levins was informed that the remainder of the evaluation will be completed by another provider, this initial triage assessment does not replace that evaluation, and the importance of remaining in the ED until their evaluation is complete.     Loni Beckwith, PA-C 12/14/20 1735    Davonna Belling, MD 12/14/20 2208

## 2020-12-14 NOTE — ED Notes (Addendum)
Pt A&OX4 ambulatory at d/c with independent steady gait, NAD. Pt verbalized understanding of d/c instructions and follow up care. ?

## 2020-12-14 NOTE — ED Triage Notes (Signed)
Patient returns to ED after leaving without being seen after requesting a psychiatric evaluation adjustment to her psychiatric medications. Patient BIB [facility]

## 2020-12-14 NOTE — ED Provider Notes (Signed)
[facility] EMERGENCY DEPARTMENT Provider Note   CSN: [id number] Arrival date & time: 12/14/20  1725     History Chief Complaint  Patient presents with   Psychiatric Evaluation    Nicole Reeves is a 35 y.o. female.  HPI Patient is a 35 year old female presented today to the emergency room with complaints of circumferential achy headache for the past couple days.  She states that she does have headaches occasionally.  States that she has a history of marijuana, cocaine, polysubstance abuse including amphetamines states that she has been trying to stop using recreational drugs.  States that over the past 2 weeks she has been having issues with difficulty sleeping and has been more anxious.  She denies specifically any SI, HI, AVH.  Has no thoughts of hurting or moving her self.  States that she is not actively using alcohol.  States that she primarily uses marijuana at this point.  Denies any fevers chills nausea vomiting cough congestion denies any back pain or lower extremity pain.     Past Medical History:  Diagnosis Date   Anxiety    Colitis 11/2014   acute onset bloody diarrhea. C diff, O&P negative.    Depression    Hepatitis C 04/2016   Left sided ulcerative colitis ([place])    Polysubstance abuse ([place]) 02/24/2014   tox screen + for THC, Barbiturates, amphetimine.    Patient Active Problem List   Diagnosis Date Noted   Chronic hepatitis C without hepatic coma ([place]) 06/28/2016   Vaccine counseling 06/28/2016   Diarrhea    Blood in stool    Hyponatremia 12/07/2014   Acute colitis 12/07/2014    Past Surgical History:  Procedure Laterality Date   FLEXIBLE SIGMOIDOSCOPY N/A 12/10/2014   Procedure: FLEXIBLE SIGMOIDOSCOPY;  Surgeon: Mauri Pole, MD;  Location: [facility];  Service: Endoscopy;  Laterality: N/A;     OB History   No obstetric history on file.     Family History  Problem Relation Age of Onset   ADD / ADHD Sister     Anxiety disorder Sister    Drug abuse Paternal Aunt    Alcohol abuse Maternal Grandfather    Alcohol abuse Maternal Grandmother    Alcohol abuse Paternal Grandfather    Alcohol abuse Paternal Grandmother    Anxiety disorder Paternal Grandmother    Drug abuse Paternal Grandmother    Anxiety disorder Sister    Depression Sister    Colon cancer Neg Hx    Esophageal cancer Neg Hx    Rectal cancer Neg Hx    Stomach cancer Neg Hx    Colon polyps Neg Hx     Social History   Tobacco Use   Smoking status: Former    Packs/day: 0.25    Types: Cigarettes, E-cigarettes   Smokeless tobacco: Never   Tobacco comments:    Reports desire to quite e-cigarettes   Vaping Use   Vaping Use: Every day  Substance Use Topics   Alcohol use: Not Currently    Alcohol/week: 0.0 standard drinks   Drug use: Not Currently    Types: Marijuana, Heroin    Comment: not used since 02/2014    Home Medications Prior to Admission medications   Medication Sig Start Date End Date Taking? Authorizing Provider  hydrOXYzine (ATARAX/VISTARIL) 25 MG tablet Take 1 tablet (25 mg total) by mouth every 6 (six) hours. 12/14/20  Yes Renley Banwart S, PA  budesonide (ENTOCORT EC) 3 MG 24 hr capsule Take 3  capsules (9 mg total) by mouth daily for 28 days, THEN 2 capsules (6 mg total) daily for 14 days, THEN 1 capsule (3 mg total) daily for 14 days. 10/20/20 12/15/20  Levin Erp, PA  gabapentin (NEURONTIN) 300 MG capsule Take 300 mg by mouth daily.    [provider]  mesalamine (APRISO) 0.375 g 24 hr capsule Take 4 capsules (1.5 g total) by mouth daily. 10/20/20   Levin Erp, PA  QUEtiapine (SEROQUEL) 100 MG tablet Take 200 mg by mouth at bedtime. 09/27/20   [provider]  VYVANSE 60 MG capsule Take 60 mg by mouth every morning. 10/19/20   [provider]    Allergies    Buspirone  Review of Systems   Review of Systems  Constitutional:  Negative for chills and fever.   HENT:  Negative for congestion.   Eyes:  Negative for pain.  Respiratory:  Negative for cough and shortness of breath.   Cardiovascular:  Negative for chest pain and leg swelling.  Gastrointestinal:  Negative for abdominal pain and vomiting.  Genitourinary:  Negative for dysuria.  Musculoskeletal:  Negative for myalgias.  Skin:  Negative for rash.  Neurological:  Negative for dizziness and headaches.  Psychiatric/Behavioral:  Positive for sleep disturbance. Negative for hallucinations, self-injury and suicidal ideas. The patient is nervous/anxious and is hyperactive.    Physical Exam Updated Vital Signs BP 125/89 (BP Location: Left Arm)   Pulse 86   Temp 98 F (36.7 C)   Resp 16   LMP 12/13/2020 (Exact Date)   SpO2 100%   Physical Exam Vitals and nursing note reviewed.  Constitutional:      General: She is not in acute distress.    Comments: Pleasant well-appearing 35 year old female in no acute distress but somewhat anxious appearing.  HENT:     Head: Normocephalic and atraumatic.     Nose: Nose normal.  Eyes:     General: No scleral icterus. Cardiovascular:     Rate and Rhythm: Normal rate and regular rhythm.     Pulses: Normal pulses.     Heart sounds: Normal heart sounds.  Pulmonary:     Effort: Pulmonary effort is normal. No respiratory distress.     Breath sounds: No wheezing.  Abdominal:     Palpations: Abdomen is soft.     Tenderness: There is no abdominal tenderness.  Musculoskeletal:     Cervical back: Normal range of motion.     Right lower leg: No edema.     Left lower leg: No edema.  Skin:    General: Skin is warm and dry.     Capillary Refill: Capillary refill takes less than 2 seconds.  Neurological:     Mental Status: She is alert. Mental status is at baseline.  Psychiatric:        Mood and Affect: Mood normal.     Comments: Benign mood.  Linear thinking and without pressured speech    ED Results / Procedures / Treatments   Labs (all labs  ordered are listed, but only abnormal results are displayed) Labs Reviewed  COMPREHENSIVE METABOLIC PANEL - Abnormal; Notable for the following components:      Result Value   Potassium 3.3 (*)    All other components within normal limits  RAPID URINE DRUG SCREEN, HOSP PERFORMED - Abnormal; Notable for the following components:   Benzodiazepines POSITIVE (*)    Tetrahydrocannabinol POSITIVE (*)    All other components within normal limits  ETHANOL  CBC WITH DIFFERENTIAL/PLATELET    EKG None  Radiology No results found.  Procedures Procedures   Medications Ordered in ED Medications  metoCLOPramide (REGLAN) tablet 10 mg (10 mg Oral Not Given 12/14/20 1947)  diphenhydrAMINE (BENADRYL) capsule 25 mg (25 mg Oral Not Given 12/14/20 1948)  acetaminophen (TYLENOL) tablet 1,000 mg (1,000 mg Oral Not Given 12/14/20 1947)    ED Course  I have reviewed the triage vital signs and the nursing notes.  Pertinent labs & imaging results that were available during my care of the patient were reviewed by me and considered in my medical decision making (see chart for details).    MDM Rules/Calculators/A&P                          Patient is a 35 year old female presenting today because she states she feels anxious.  She has issues with insomnia and headache currently.  She does use marijuana but states that she is try to cut back on this.  She has a history of more significant polysubstance abuse however states she has been clean for at least a couple months.  Vital signs are within normal limits here.  Physical exam she is well-appearing somewhat anxious but not suicidal homicidal or hallucinating.  She is very pleasant and able to tell me that her concerns primarily about her sleep.  Recommended melatonin, hydroxyzine and follow-up with [facility].  Patient has basic labs obtained in triage urine drug screen positive for benzodiazepines and marijuana CBC unremarkable CMP with very mild  hypokalemia  Ethanol negative  Reassessed patient she continues to have no complaints at this time.  Will discharge patient home.  I did offer patient Tylenol Benadryl Reglan p.o. for headache which she declined.  Final Clinical Impression(s) / ED Diagnoses Final diagnoses:  Nonintractable headache, unspecified chronicity pattern, unspecified headache type  Insomnia, unspecified type    Rx / DC Orders ED Discharge Orders          Ordered    hydrOXYzine (ATARAX/VISTARIL) 25 MG tablet  Every 6 hours        12/14/20 1958             Tedd Sias, [place] 12/14/20 2102    Lorelle Gibbs, DO 12/14/20 2331

## 2020-12-14 NOTE — ED Notes (Signed)
Pt placed the pills into her mouth but before swallowing she immediately spit them out and stated she doesn't "trust pills" and said she doesn't want to take the pills. Pills thrown away. Ova Freshwater, PA informed.

## 2020-12-14 NOTE — Discharge Instructions (Addendum)
I strongly recommend going to the behavioral health urgent care.  You may always return to the ER for any new or concerning symptoms  You may take Tylenol 1000 mg every 6 hours or ibuprofen 600 mg every 6 hours you may in fact alternate between the 2 of these medications.  I have also prescribed a medication called hydroxyzine which can help both with headaches as well as with anxiety.  You may take this medication if you choose.

## 2020-12-14 NOTE — ED Triage Notes (Signed)
Patient here requesting psychiatric evaluation. 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 Patient states "I think maybe I'm going crazy", states she was diagnosed with affective disorder in 2018 and states the provider at the time also mentioned bipolar disorder. Started taking seroquel in 2019. Patient states she wants to be reevaluated today because she is having trouble sleeping the several nights. Denies AVH, denies SI/HI. Patient is calm, cooperative, and in no apparent distress at this time.

## 2020-12-15 ENCOUNTER — Ambulatory Visit (HOSPITAL_COMMUNITY)
Admission: EM | Admit: 2020-12-15 | Discharge: 2020-12-15 | Disposition: A | Discharge status: Home / Self Care | Payer: No Payment, Other | Attending: Psychiatry | Admitting: Psychiatry

## 2020-12-15 ENCOUNTER — Encounter (HOSPITAL_COMMUNITY): Payer: Self-pay | Admitting: Emergency Medicine

## 2020-12-15 ENCOUNTER — Emergency Department (HOSPITAL_COMMUNITY): Payer: Self-pay

## 2020-12-15 DIAGNOSIS — F319 Bipolar disorder, unspecified: Secondary | ICD-10-CM | POA: Insufficient documentation

## 2020-12-15 DIAGNOSIS — Z79899 Other long term (current) drug therapy: Secondary | ICD-10-CM | POA: Insufficient documentation

## 2020-12-15 DIAGNOSIS — F4310 Post-traumatic stress disorder, unspecified: Secondary | ICD-10-CM | POA: Insufficient documentation

## 2020-12-15 DIAGNOSIS — R519 Headache, unspecified: Secondary | ICD-10-CM | POA: Insufficient documentation

## 2020-12-15 DIAGNOSIS — F1994 Other psychoactive substance use, unspecified with psychoactive substance-induced mood disorder: Secondary | ICD-10-CM | POA: Insufficient documentation

## 2020-12-15 DIAGNOSIS — Z91128 Patient's intentional underdosing of medication regimen for other reason: Secondary | ICD-10-CM | POA: Insufficient documentation

## 2020-12-15 DIAGNOSIS — Z560 Unemployment, unspecified: Secondary | ICD-10-CM | POA: Insufficient documentation

## 2020-12-15 DIAGNOSIS — F909 Attention-deficit hyperactivity disorder, unspecified type: Secondary | ICD-10-CM | POA: Insufficient documentation

## 2020-12-15 MED ORDER — IOHEXOL 350 MG/ML SOLN
80.0000 mL | Freq: Once | INTRAVENOUS | Status: AC | PRN
  Administered 2020-12-15: 80 mL via INTRAVENOUS

## 2020-12-15 MED ORDER — KETOROLAC TROMETHAMINE 30 MG/ML IJ SOLN
30.0000 mg | Freq: Once | INTRAMUSCULAR | Status: DC
  Filled 2020-12-15: qty 1

## 2020-12-15 NOTE — BH Assessment (Signed)
Pt here from [facility] reporting that she was refused treatment. Chart review says otherwise. Pt also reports that she did not know how to get home from the ED last night because it was dark. Pt reports she hasn't slept in days and has been walking around in the cold for several days but acknowledges having a place to live. Pt reports she is on her menstrual cycle and is bleeding through her clothes. Pt reports she has been off her medications (Seroquel) for a couple of days and denies having outpt services currently.  Pt reports dx of bipolar I, anxiety, ADHD and PTSD. Pt seems anxious and is tearful during triage. Pt denies SI, HI, AVH.  Pt is routine.

## 2020-12-15 NOTE — ED Provider Notes (Signed)
Behavioral Health Urgent Care Medical Screening Exam  Patient Name: Nicole Reeves MRN: [id number] Date of Evaluation: 12/15/20 Chief Complaint:   Diagnosis:  Final diagnoses:  Substance induced mood disorder ([place])    History of Present illness: Nicole Reeves is a 35 y.o. female.  Patient presents voluntarily to [facility] behavioral health for walk-in assessment.  She was transferred from [facility] emergency department after being assessed for ongoing headache.  Patient states "I have not slept, I have bipolar disorder PTSD and ADHD."  Bekki reports she typically sleeps well after taking her Seroquel.  She has not taken Seroquel for approximately 5 days as she did not have access this medication.  She reports plan to return to her apartment today where her Seroquel is available to her.  Recent stressors include overuse of prescribed Vyvanse.  She reports she stopped using substances including Vyvanse 12 days ago.  She last used hallucinogenic including acid and mushrooms 2 weeks ago.  She stopped using marijuana 9 days ago.  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  She denies alcohol use.  She would like to seek residential substance use treatment today, if unable to discharge directly she would be willing to follow-up with resources.  She is currently stable on Seroquel and is followed by Dr. Burt Knack at the mood treatment center.  She is recently attempted to schedule an appointment with therapist, Lenna Sciara, at the regular center.  She has met with Melissa in the past but it has been a "long time ago."  Patient is assessed face-to-face by nurse practitioner.  She is seated in assessment area, no acute distress.  She is alert and oriented, pleasant and cooperative during assessment.   She presents with anxious mood, congruent affect. She denies suicidal and homicidal ideations.  She denies any history of suicide attempts, denies history of self-harm.  She contracts  verbally for safety with this Probation officer.  She has normal speech and behavior.  She denies both auditory and visual hallucinations.  Patient is able to converse coherently with goal-directed thoughts and no distractibility or preoccupation.  She denies paranoia.  Objectively there is no evidence of psychosis/mania or delusional thinking.  Nicole Reeves resides in [place], denies access to weapons.  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  She enjoys caring for animals.  She endorses average appetite decreased sleep.  She reports her primary support is her mother.  Patient offered support and encouragement.   Psychiatric Specialty Exam  Presentation  General Appearance:Appropriate for Environment; Casual  Eye Contact:Good  Speech:Clear and Coherent; Normal Rate  Speech Volume:Normal  Handedness:Right   Mood and Affect  Mood:Anxious  Affect:Appropriate; Congruent   Thought Process  Thought Processes:Coherent; Goal Directed; Linear  Descriptions of Associations:Intact  Orientation:Full (Time, Place and Person)  Thought Content:Logical    Hallucinations:None  Ideas of Reference:None  Suicidal Thoughts:No  Homicidal Thoughts:No   Sensorium  Memory:Immediate Good; Recent Good; Remote Good  Judgment:Good  Insight:Fair   Executive Functions  Concentration:Good  Attention Span:Good  [place] of Knowledge:Good  Language:Good   Psychomotor Activity  Psychomotor Activity:Normal   Assets  Assets:Communication Skills; Desire for Improvement; Financial Resources/Insurance; Housing; Intimacy; Leisure Time; Physical Health; Resilience; Social Support; Talents/Skills; Transportation   Sleep  Sleep:Poor  Number of hours:  No data recorded  No data recorded  Physical Exam: Physical Exam Vitals and nursing note reviewed.  Constitutional:      Appearance: Normal appearance. She is well-developed and normal weight.  HENT:     Head: Normocephalic and atraumatic.     Nose: Nose normal.  Cardiovascular:     Rate and Rhythm: Normal rate.  Pulmonary:     Effort: Pulmonary effort is normal.  Musculoskeletal:        General: Normal range of motion.     Cervical back: Normal range of motion.  Skin:    General: Skin is warm and dry.  Neurological:     Mental Status: She is alert and oriented to person, place, and time.  Psychiatric:        Attention and Perception: Attention and perception normal.        Mood and Affect: Affect normal. Mood is anxious.        Speech: Speech normal.        Behavior: Behavior normal. Behavior is cooperative.        Thought Content: Thought content normal.        Cognition and Memory: Cognition and memory normal.        Judgment: Judgment normal.   Review of Systems  Constitutional: Negative.   HENT: Negative.    Eyes: Negative.   Respiratory: Negative.    Cardiovascular: Negative.   Gastrointestinal: Negative.   Genitourinary: Negative.   Musculoskeletal: Negative.   Skin: Negative.   Neurological: Negative.   Endo/Heme/Allergies: Negative.   Psychiatric/Behavioral:  Positive for substance abuse. The patient is nervous/anxious.   Blood pressure 127/80, pulse 61, temperature 97.7 F (36.5 C), temperature source Oral, resp. rate 16, last menstrual period 12/13/2020, SpO2 100 %. There is no height or weight on file to calculate BMI.  Musculoskeletal: Strength & Muscle Tone: within normal limits Gait & Station: normal Patient leans: N/A   [place] MSE Discharge Disposition for Follow up and Recommendations: Based on my evaluation the patient does not appear to have an emergency medical condition and can be discharged with resources and follow up care in outpatient services for Medication Management and Individual Therapy Patient reviewed with Dr. Serafina Mitchell. Follow up with  established outpatient psychiatry.  Continue current medication -Seroquel 278m QHS/mood Follow up with substance use treatment resources provided.     TLucky Rathke FNP 12/15/2020, 8:51 AM

## 2020-12-15 NOTE — Discharge Instructions (Addendum)
Patient is instructed prior to discharge to:  Take all medications as prescribed by his/her mental healthcare provider. Report any adverse effects and or reactions from the medicines to his/her outpatient provider promptly. Keep all scheduled appointments, to ensure that you are getting refills on time and to avoid any interruption in your medication.  If you are unable to keep an appointment call to reschedule.  Be sure to follow-up with resources and follow-up appointments provided.  Patient has been instructed & cautioned: To not engage in alcohol and or illegal drug use while on prescription medicines. In the event of worsening symptoms, patient is instructed to call the crisis hotline, 911 and or go to the nearest ED for appropriate evaluation and treatment of symptoms. To follow-up with his/her primary care provider for your other medical issues, concerns and or health care needs.    Substance Abuse Treatment Resources listed Below:  [facility] - Admissions are currently completed Monday through Friday at [place]; both appointments and walk-ins are accepted.  Any individual that is a [facility] resident may present for a substance abuse screening and assessment for admission.  A person may be referred by numerous sources or self-refer.   Potential clients will be screened for medical necessity and appropriateness for the program.  Clients must meet criteria for high-intensity residential treatment services.  If clinically appropriate, a client will continue with the comprehensive clinical assessment and intake process, as well as enrollment in the [facility].  Address: [address] [place], [place] [address] Admin Hours: [place] Caul to [place] Hours: 24/7 Phone: [phone] Fax: [place] Address: [place], [place], [place] [address] Behavioral Health Urgent Care [facility]) Hours: 24/7 Phone:  [phone] Fax: [phone]  [facility] ([facility]): (offers outpatient therapy and intensive outpatient substance abuse therapy).  [address], [place], [place] [address] Phone: [phone]  [facility]: Offers FREE recovery skills classes, support groups, 1:1 Peer Support, and Compeer Classes. [address], [place], [place] [address] Phone: [phone] (Call to complete intake).   [facility] [place], [place] [address] Phone: [phone] ext [phone] [facility] provides food, shelter and other programs and services to the homeless men of [place] through our [employer] program.  By offering safe shelter, three meals a day, clean clothing, Biblical counseling, financial planning, vocational training, GED/education and employment assistance, we've helped mend the shattered lives of many homeless men since opening in 1974.  We have approximately 267 beds available, with a max of 312 beds including mats for emergency situations and currently house an average of 270 men a night.  Prospective Client Check-In Information Photo ID Required (State/ Out of State/ [facility]) - if photo ID is not available, clients are required to have a printout of a police/sheriff's criminal history report. Help out with chores around the [place]. No sex offender of any type (pending, charged, registered and/or any other sex related offenses) will be permitted to check in. Must be willing to abide by all rules, regulations, and policies established by the [employer]. The following will be provided - shelter, food, clothing, and biblical counseling. If you or someone you know is in need of assistance at our [facility] shelter in [place], [place], please call [phone] ext. [phone].  [facility]-will provide timely access to mental health services for children and adolescents (4-17) and adults  presenting in a mental health crisis. The program is designed for those who need urgent Behavioral Health or Substance Use treatment and are not experiencing a medical crisis that would typically require an emergency room visit.    [place], [place] [address] Phone: [phone] [email or web address]: Phone#: [phone]  The [facility] SA Intensive Outpatient Program (SAIOP) means structured individual and group addiction activities and services that are provided at an outpatient program designed to assist adult and adolescent consumers to begin recovery and learn skills for recovery maintenance. The [place] program is offered at least 3 hours a day, 3 days a week.SAIOP services shall include a structured program consisting of, but not limited to, the following services: Individual counseling and support; Group counseling and support; Family counseling, training or support; Biochemical assays to identify recent drug use (e.g., urine drug screens); Strategies for relapse prevention to include community and social support systems in treatment; Life skills; Crisis contingency planning; Disease Management; and Treatment support activities that have been adapted or specifically designed for persons with physical disabilities, or persons with co-occurring disorders of mental illness and substance abuse/dependence or mental retardation/developmental disability and substance abuse/dependence. Phone: [phone]  Address:   The [facility] will also offer the following outpatient services: (Monday through Friday 8am-5pm)   Partial Hospitalization Program (PHP) Substance Abuse Intensive Outpatient Program (SA-IOP) Group Therapy Medication Management Peer Living Room We also provide (24/7):  Assessments: Our mental health clinician and providers will conduct a focused mental health evaluation, assessing for immediate  safety concerns and further mental health needs. Referral: Our team will provide resources and help connect to community based mental health treatment, when indicated, including psychotherapy, psychiatry, and other specialized behavioral health or substance use disorder services (for those not already in treatment). Transitional Care: Our team providers in person bridging and/or telephonic follow-up during the patient's transition to outpatient services.  [facility] 24-Hour Call Center: [phone] Behavioral Health Crisis Line: [phone]

## 2020-12-15 NOTE — ED Notes (Signed)
Pt A&OX4 ambulatory at d/c with independent steady gait. Pt verbalized understanding of d/c instructions and follow up care.

## 2020-12-15 NOTE — Progress Notes (Signed)
CSW contacted facilities regarding inpatient substance use treatment:  [facility]: no bed availability (2-3 week wait) [facility]: no bed availability [facility]: no bed availability except for gero [facility]: two beds available. Pt would have to show up there for assessment. [facility]: no bed availability currently but may have some later today.  [facility]: no bed availability until tomorrow morning.   CSW will follow up with [facility] to check bed status later in the day.   Chalmers Guest. Guerry Bruin, MSW, [place], [place] 12/15/2020 10:09 AM

## 2020-12-15 NOTE — ED Notes (Signed)
Pt discharged with resources in hand. No acute distress noted. Bus pass provided. Safety maintained.

## 2020-12-15 NOTE — ED Notes (Signed)
Patient transported to CT 

## 2020-12-15 NOTE — ED Provider Notes (Addendum)
[facility] EMERGENCY DEPARTMENT Provider Note   CSN: [id number] Arrival date & time: 12/14/20  2220     History Chief Complaint  Patient presents with   Headache    Nicole Reeves is a 35 y.o. female.   Headache Pain location:  Frontal Quality:  Dull Radiates to:  Does not radiate Severity currently:  9/10 Severity at highest:  8/10 Onset quality:  Gradual Duration:  3 days Timing:  Constant Progression:  Unchanged Chronicity:  New Context: not eating, not stress and not exposure to cold air   Relieved by:  Nothing Worsened by:  Nothing Ineffective treatments:  None tried Associated symptoms: no abdominal pain, no back pain, no blurred vision, no congestion, no cough, no diarrhea, no dizziness, no drainage, no ear pain, no eye pain, no facial pain, no fatigue, no fever, no focal weakness, no hearing loss, no loss of balance, no myalgias, no nausea, no near-syncope, no neck pain, no neck stiffness, no numbness, no paresthesias, no photophobia, no seizures, no sinus pressure, no sore throat, no swollen glands, no syncope, no tingling, no URI, no visual change, no vomiting and no weakness   Risk factors: no family hx of SAH   No Si or HI, no AH or VH.  States she is here for her headache, frontal.  Was seen earlier but did not take medication.      Past Medical History:  Diagnosis Date   Anxiety    Colitis 11/2014   acute onset bloody diarrhea. C diff, O&P negative.    Depression    Hepatitis C 04/2016   Left sided ulcerative colitis ([place])    Polysubstance abuse ([place]) 02/24/2014   tox screen + for THC, Barbiturates, amphetimine.    Patient Active Problem List   Diagnosis Date Noted   Chronic hepatitis C without hepatic coma ([place]) 06/28/2016   Vaccine counseling 06/28/2016   Diarrhea    Blood in stool    Hyponatremia 12/07/2014   Acute colitis 12/07/2014    Past Surgical History:  Procedure Laterality Date   FLEXIBLE SIGMOIDOSCOPY N/A  12/10/2014   Procedure: FLEXIBLE SIGMOIDOSCOPY;  Surgeon: Mauri Pole, MD;  Location: [facility] ENDOSCOPY;  Service: Endoscopy;  Laterality: N/A;     OB History   No obstetric history on file.     Family History  Problem Relation Age of Onset   ADD / ADHD Sister    Anxiety disorder Sister    Drug abuse Paternal Aunt    Alcohol abuse Maternal Grandfather    Alcohol abuse Maternal Grandmother    Alcohol abuse Paternal Grandfather    Alcohol abuse Paternal Grandmother    Anxiety disorder Paternal Grandmother    Drug abuse Paternal Grandmother    Anxiety disorder Sister    Depression Sister    Colon cancer Neg Hx    Esophageal cancer Neg Hx    Rectal cancer Neg Hx    Stomach cancer Neg Hx    Colon polyps Neg Hx     Social History   Tobacco Use   Smoking status: Former    Packs/day: 0.25    Types: Cigarettes, E-cigarettes   Smokeless tobacco: Never   Tobacco comments:    Reports desire to quite e-cigarettes   Vaping Use   Vaping Use: Every day  Substance Use Topics   Alcohol use: Not Currently    Alcohol/week: 0.0 standard drinks   Drug use: Not Currently    Types: Marijuana, Heroin    Comment: not  used since 02/2014    Home Medications Prior to Admission medications   Medication Sig Start Date End Date Taking? Authorizing Provider  budesonide (ENTOCORT EC) 3 MG 24 hr capsule Take 3 capsules (9 mg total) by mouth daily for 28 days, THEN 2 capsules (6 mg total) daily for 14 days, THEN 1 capsule (3 mg total) daily for 14 days. 10/20/20 12/15/20  Levin Erp, PA  gabapentin (NEURONTIN) 300 MG capsule Take 300 mg by mouth daily.    [provider]  hydrOXYzine (ATARAX/VISTARIL) 25 MG tablet Take 1 tablet (25 mg total) by mouth every 6 (six) hours. 12/14/20   Tedd Sias, PA  mesalamine (APRISO) 0.375 g 24 hr capsule Take 4 capsules (1.5 g total) by mouth daily. 10/20/20   Levin Erp, PA  QUEtiapine (SEROQUEL) 100 MG tablet Take 200 mg by  mouth at bedtime. 09/27/20   [provider]  VYVANSE 60 MG capsule Take 60 mg by mouth every morning. 10/19/20   [provider]    Allergies    Buspirone  Review of Systems   Review of Systems  Constitutional:  Negative for fatigue and fever.  HENT:  Negative for congestion, ear pain, hearing loss, postnasal drip, sinus pressure and sore throat.   Eyes:  Negative for blurred vision, photophobia and pain.  Respiratory:  Negative for cough.   Cardiovascular:  Negative for syncope and near-syncope.  Gastrointestinal:  Negative for abdominal pain, diarrhea, nausea and vomiting.  Genitourinary:  Negative for difficulty urinating.  Musculoskeletal:  Negative for back pain, myalgias, neck pain and neck stiffness.  Neurological:  Positive for headaches. Negative for dizziness, focal weakness, seizures, weakness, numbness, paresthesias and loss of balance.  Psychiatric/Behavioral:  Negative for agitation.   All other systems reviewed and are negative.  Physical Exam Updated Vital Signs BP 128/86   Pulse 89   Temp 98.2 F (36.8 C) (Oral)   Resp 18   LMP 12/13/2020 (Exact Date)   SpO2 100%   Physical Exam Vitals and nursing note reviewed.  Constitutional:      General: She is not in acute distress.    Appearance: Normal appearance.  HENT:     Head: Normocephalic and atraumatic.     Nose: Nose normal.  Eyes:     Conjunctiva/sclera: Conjunctivae normal.     Pupils: Pupils are equal, round, and reactive to light.  Cardiovascular:     Rate and Rhythm: Normal rate and regular rhythm.     Pulses: Normal pulses.     Heart sounds: Normal heart sounds.  Pulmonary:     Effort: Pulmonary effort is normal.     Breath sounds: Normal breath sounds.  Abdominal:     General: Abdomen is flat. Bowel sounds are normal.     Palpations: Abdomen is soft.     Tenderness: There is no abdominal tenderness. There is no guarding.  Musculoskeletal:        General: Normal range of  motion.     Cervical back: Normal range of motion and neck supple.  Skin:    General: Skin is warm and dry.     Capillary Refill: Capillary refill takes less than 2 seconds.  Neurological:     General: No focal deficit present.     Mental Status: She is alert and oriented to person, place, and time.     Cranial Nerves: No cranial nerve deficit.     Deep Tendon Reflexes: Reflexes normal.  Psychiatric:  Mood and Affect: Mood normal.        Behavior: Behavior normal.    ED Results / Procedures / Treatments   Labs (all labs ordered are listed, but only abnormal results are displayed) Labs Reviewed - No data to display  EKG None  Radiology CT ANGIO HEAD NECK W WO CM  Result Date: 12/15/2020 CLINICAL DATA:  Headache EXAM: CT ANGIOGRAPHY HEAD AND NECK TECHNIQUE: Multidetector CT imaging of the head and neck was performed using the standard protocol during bolus administration of intravenous contrast. Multiplanar CT image reconstructions and MIPs were obtained to evaluate the vascular anatomy. Carotid stenosis measurements (when applicable) are obtained utilizing NASCET criteria, using the distal internal carotid diameter as the denominator. CONTRAST:  28m OMNIPAQUE IOHEXOL 350 MG/ML SOLN COMPARISON:  No prior CTA, correlation is made with CT head 02/24/2014 FINDINGS: CT HEAD FINDINGS Brain: No evidence of acute infarction, hemorrhage, cerebral edema, mass, mass effect, or midline shift. Ventricles and sulci are within normal limits for age. No extra-axial fluid collection. Vascular: No hyperdense vessel or unexpected calcification. Skull: Normal. Negative for fracture or focal lesion. Sinuses/Orbits: No acute finding. Other: The mastoid air cells are well aerated. CTA NECK FINDINGS Aortic arch: 4 vessel arch, with the origin of the left vertebral artery originating from the aorta. Imaged portion shows no evidence of aneurysm or dissection. No significant stenosis. Right carotid system: No  evidence of dissection, stenosis (50% or greater) or occlusion. Left carotid system: No evidence of dissection, stenosis (50% or greater) or occlusion. Vertebral arteries: Left dominant. No evidence of dissection, stenosis (50% or greater) or occlusion. Skeleton: No acute osseous abnormality. Increased density in the superior aspect of to T3, unchanged compared to 02/24/2014. Other neck: Negative Upper chest: Negative Review of the MIP images confirms the above findings CTA HEAD FINDINGS Anterior circulation: Both internal carotid arteries are patent to the termini, without stenosis or other abnormality. A1 segments patent. Normal anterior communicating artery. Anterior cerebral arteries are patent to their distal aspects. No M1 stenosis or occlusion. Normal MCA bifurcations. Distal MCA branches perfused and symmetric. Posterior circulation: Vertebral arteries widely patent to the vertebrobasilar junction without stenosis. Posterior inferior cerebral arteries patent bilaterally. Basilar patent to its distal aspect. Superior cerebral arteries patent bilaterally. PCAs well perfused to their distal aspects without stenosis. The right posterior communicating artery is visualized. The left posterior communicating artery is not visualized. Venous sinuses: As permitted by contrast timing, patent. Anatomic variants: None significant Review of the MIP images confirms the above findings IMPRESSION: 1. No acute intracranial process. 2. No stenosis in the neck. 3. No intracranial large vessel occlusion or significant stenosis. Electronically Signed   By: AMerilyn BabaM.D.   On: 12/15/2020 00:41    Procedures Procedures   Medications Ordered in ED Medications  ketorolac (TORADOL) 30 MG/ML injection 30 mg (30 mg Intravenous Not Given 12/15/20 0124)  magnesium sulfate IVPB 2 g 50 mL (2 g Intravenous New Bag/Given 12/15/20 0026)  iohexol (OMNIPAQUE) 350 MG/ML injection 80 mL (80 mLs Intravenous Contrast Given 12/15/20  0019)    ED Course  I have reviewed the triage vital signs and the nursing notes.  Pertinent labs & imaging results that were available during my care of the patient were reviewed by me and considered in my medical decision making (see chart for details).   No SI or HI, no AH or VH.  Is refusing medications in the ED. There is no indication for TTS.  She was discharged with  improved pain post magnesium.    Nicole Reeves was evaluated in Emergency Department on 12/15/2020 for the symptoms described in the history of present illness. She was evaluated in the context of the global COVID-19 pandemic, which necessitated consideration that the patient might be at risk for infection with the SARS-CoV-2 virus that causes COVID-19. Institutional protocols and algorithms that pertain to the evaluation of patients at risk for COVID-19 are in a state of rapid change based on information released by regulatory bodies including the CDC and federal and state organizations. These policies and algorithms were followed during the patient's care in the ED.  Final Clinical Impression(s) / ED Diagnoses Final diagnoses:  None  Return for intractable cough, coughing up blood, fevers > 100.4 unrelieved by medication, shortness of breath, intractable vomiting, chest pain, shortness of breath, weakness, numbness, changes in speech, facial asymmetry, abdominal pain, passing out, Inability to tolerate liquids or food, cough, altered mental status or any concerns. No signs of systemic illness or infection. The patient is nontoxic-appearing on exam and vital signs are within normal limits.  I have reviewed the triage vital signs and the nursing notes. Pertinent labs & imaging results that were available during my care of the patient were reviewed by me and considered in my medical decision making (see chart for details). After history, exam, and medical workup I feel the patient has been appropriately medically screened and  is safe for discharge home. Pertinent diagnoses were discussed with the patient. Patient was given return precautions.   Rx / DC Orders ED Discharge Orders     None        Dary Dilauro, MD 12/15/20 1245    Randal Buba, Loring Liskey, MD 12/15/20 8099

## 2020-12-15 NOTE — [facility] Notes (Signed)
Patient discharged from [facility] and given bus pass. Patient reported she does not know where home is. TTS provided patient her home address, however, she expressing she does not know where home is. TTS called [facility] [phone], unable to leave a message due to mailbox full. 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 She is talking in the 3rd person, hyper religious, and expressing she's an animal.

## 2020-12-16 ENCOUNTER — Telehealth (HOSPITAL_COMMUNITY): Payer: Self-pay | Admitting: Emergency Medicine

## 2020-12-16 NOTE — BH Assessment (Addendum)
Care Management - Follow Up [facility] Discharges   Writer attempted to make contact with patient today and was unsuccessful.  Patient voicemail is not set up.  Patient phone just rang.   Per chart review, patient was provided with outpatient resources at discharge.

## 2020-12-18 ENCOUNTER — Encounter (HOSPITAL_COMMUNITY): Payer: Self-pay | Admitting: Emergency Medicine

## 2020-12-18 ENCOUNTER — Other Ambulatory Visit: Payer: Self-pay

## 2020-12-18 ENCOUNTER — Ambulatory Visit (HOSPITAL_COMMUNITY)
Admission: EM | Admit: 2020-12-18 | Discharge: 2020-12-19 | Disposition: A | Discharge status: Home / Self Care | Payer: No Payment, Other | Attending: Urology | Admitting: Urology

## 2020-12-18 DIAGNOSIS — Z20822 Contact with and (suspected) exposure to covid-19: Secondary | ICD-10-CM | POA: Insufficient documentation

## 2020-12-18 DIAGNOSIS — Z7952 Long term (current) use of systemic steroids: Secondary | ICD-10-CM | POA: Insufficient documentation

## 2020-12-18 DIAGNOSIS — F17210 Nicotine dependence, cigarettes, uncomplicated: Secondary | ICD-10-CM | POA: Insufficient documentation

## 2020-12-18 DIAGNOSIS — F1994 Other psychoactive substance use, unspecified with psychoactive substance-induced mood disorder: Secondary | ICD-10-CM | POA: Diagnosis not present

## 2020-12-18 DIAGNOSIS — F1290 Cannabis use, unspecified, uncomplicated: Secondary | ICD-10-CM | POA: Insufficient documentation

## 2020-12-18 DIAGNOSIS — F419 Anxiety disorder, unspecified: Secondary | ICD-10-CM | POA: Insufficient documentation

## 2020-12-18 DIAGNOSIS — Z046 Encounter for general psychiatric examination, requested by authority: Secondary | ICD-10-CM

## 2020-12-18 LAB — COMPREHENSIVE METABOLIC PANEL
ALT: 22 U/L (ref 0–44)
AST: 28 U/L (ref 15–41)
Albumin: 4 g/dL (ref 3.5–5.0)
Alkaline Phosphatase: 51 U/L (ref 38–126)
Anion gap: 10 (ref 5–15)
BUN: 11 mg/dL (ref 6–20)
CO2: 26 mmol/L (ref 22–32)
Calcium: 9.5 mg/dL (ref 8.9–10.3)
Chloride: 102 mmol/L (ref 98–111)
Creatinine, Ser: 0.56 mg/dL (ref 0.44–1.00)
GFR, Estimated: >60 mL/min (ref >60)
Glucose, Bld: 84 mg/dL (ref 70–99)
Potassium: 2.9 mmol/L — ABNORMAL LOW (ref 3.5–5.1)
Sodium: 138 mmol/L (ref 135–145)
Total Bilirubin: 0.4 mg/dL (ref 0.3–1.2)
Total Protein: 7.6 g/dL (ref 6.5–8.1)

## 2020-12-18 LAB — POCT URINE DRUG SCREEN - MANUAL ENTRY (I-SCREEN)
POC Amphetamine UR: NOT DETECTED
POC Buprenorphine (BUP): NOT DETECTED
POC Cocaine UR: NOT DETECTED
POC Marijuana UR: POSITIVE — AB
POC Methadone UR: NOT DETECTED
POC Methamphetamine UR: NOT DETECTED
POC Morphine: NOT DETECTED
POC Oxazepam (BZO): NOT DETECTED
POC Oxycodone UR: NOT DETECTED
POC Secobarbital (BAR): NOT DETECTED

## 2020-12-18 LAB — CBC WITH DIFFERENTIAL/PLATELET
Abs Immature Granulocytes: 0.02 10*3/uL (ref 0.00–0.07)
Basophils Absolute: 0.1 10*3/uL (ref 0.0–0.1)
Basophils Relative: 1 %
Eosinophils Absolute: 0.1 10*3/uL (ref 0.0–0.5)
Eosinophils Relative: 1 %
HCT: 37.3 % (ref 36.0–46.0)
Hemoglobin: 12.8 g/dL (ref 12.0–15.0)
Immature Granulocytes: 0 %
Lymphocytes Relative: 25 %
Lymphs Abs: 2.4 10*3/uL (ref 0.7–4.0)
MCH: 31.2 pg (ref 26.0–34.0)
MCHC: 34.3 g/dL (ref 30.0–36.0)
MCV: 91 fL (ref 80.0–100.0)
Monocytes Absolute: 0.7 10*3/uL (ref 0.1–1.0)
Monocytes Relative: 8 %
Neutro Abs: 6.1 10*3/uL (ref 1.7–7.7)
Neutrophils Relative %: 65 %
Platelets: 390 10*3/uL (ref 150–400)
RBC: 4.1 MIL/uL (ref 3.87–5.11)
RDW: 13.2 % (ref 11.5–15.5)
WBC: 9.4 10*3/uL (ref 4.0–10.5)
nRBC: 0 % (ref 0.0–0.2)

## 2020-12-18 LAB — TSH: TSH: 3.014 u[IU]/mL (ref 0.350–4.500)

## 2020-12-18 LAB — RESP PANEL BY RT-PCR (FLU A&B, COVID) ARPGX2
Influenza A by PCR: NEGATIVE
Influenza B by PCR: NEGATIVE
SARS Coronavirus 2 by RT PCR: NEGATIVE

## 2020-12-18 LAB — HEMOGLOBIN A1C
Hgb A1c MFr Bld: 5 % (ref 4.8–5.6)
Mean Plasma Glucose: 96.8 mg/dL

## 2020-12-18 LAB — LIPID PANEL
Cholesterol: 143 mg/dL (ref 0–200)
HDL: 68 mg/dL (ref >40)
LDL Cholesterol: 65 mg/dL (ref 0–99)
Total CHOL/HDL Ratio: 2.1 RATIO
Triglycerides: 49 mg/dL (ref <150)
VLDL: 10 mg/dL (ref 0–40)

## 2020-12-18 LAB — POC SARS CORONAVIRUS 2 AG -  ED: SARS Coronavirus 2 Ag: NEGATIVE

## 2020-12-18 LAB — POCT PREGNANCY, URINE: Preg Test, Ur: NEGATIVE

## 2020-12-18 LAB — POC SARS CORONAVIRUS 2 AG: SARSCOV2ONAVIRUS 2 AG: NEGATIVE

## 2020-12-18 MED ORDER — MAGNESIUM HYDROXIDE 400 MG/5ML PO SUSP: 30.0000 mL | Freq: Every day | ORAL | Status: DC | PRN

## 2020-12-18 MED ORDER — LORAZEPAM 1 MG PO TABS
1.0000 mg | ORAL_TABLET | Freq: Once | ORAL | Status: AC
  Administered 2020-12-18: 1 mg via ORAL
  Filled 2020-12-18: qty 1

## 2020-12-18 MED ORDER — ALUM & MAG HYDROXIDE-SIMETH 200-200-20 MG/5ML PO SUSP: 30.0000 mL | ORAL | Status: DC | PRN

## 2020-12-18 MED ORDER — ACETAMINOPHEN 325 MG PO TABS: 650.0000 mg | ORAL_TABLET | Freq: Four times a day (QID) | ORAL | Status: DC | PRN

## 2020-12-18 MED ORDER — TRAZODONE HCL 50 MG PO TABS
50.0000 mg | ORAL_TABLET | Freq: Every evening | ORAL | Status: DC | PRN
Start: 2020-12-18 — End: 2020-12-19
  Administered 2020-12-18: 50 mg via ORAL
  Filled 2020-12-18: qty 1

## 2020-12-18 MED ORDER — HYDROXYZINE HCL 25 MG PO TABS: 25.0000 mg | ORAL_TABLET | Freq: Three times a day (TID) | ORAL | Status: DC | PRN

## 2020-12-18 MED ORDER — OLANZAPINE 15 MG PO TBDP
15.0000 mg | ORAL_TABLET | Freq: Once | ORAL | Status: AC
  Administered 2020-12-18: 15 mg via ORAL
  Filled 2020-12-18: qty 1
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
## 2020-12-18 NOTE — ED Notes (Signed)
Pt sleeping@this  time. Breathing even and unlabored. Will continue to monitor for safety

## 2020-12-18 NOTE — BH Assessment (Signed)
Nicole Reeves "[name]", Emergent, MR #[id number]; 35 years old, who presents involuntarily to [facility] via [employer] and unaccompanied. Pt denies SI, HI, or AVH.  Pt states "I need my mental health check".  Pt admits to prior MH diagnosis; also, reports that she is taking prescribed medication for symptom management.  MSE signed by patient
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
## 2020-12-19 ENCOUNTER — Encounter (HOSPITAL_COMMUNITY): Payer: Self-pay | Admitting: Emergency Medicine

## 2020-12-19 ENCOUNTER — Encounter (HOSPITAL_COMMUNITY): Payer: Self-pay | Admitting: Registered Nurse

## 2020-12-19 ENCOUNTER — Emergency Department (HOSPITAL_COMMUNITY)
Admission: EM | Admit: 2020-12-19 | Discharge: 2020-12-19 | Discharge status: Against Medical Advice | Payer: Medicaid Other | Attending: Emergency Medicine | Admitting: Emergency Medicine

## 2020-12-19 DIAGNOSIS — Z046 Encounter for general psychiatric examination, requested by authority: Secondary | ICD-10-CM

## 2020-12-19 DIAGNOSIS — Z5321 Procedure and treatment not carried out due to patient leaving prior to being seen by health care provider: Secondary | ICD-10-CM | POA: Insufficient documentation

## 2020-12-19 DIAGNOSIS — R030 Elevated blood-pressure reading, without diagnosis of hypertension: Secondary | ICD-10-CM | POA: Insufficient documentation

## 2020-12-19 DIAGNOSIS — F1994 Other psychoactive substance use, unspecified with psychoactive substance-induced mood disorder: Secondary | ICD-10-CM

## 2020-12-19 MED ORDER — POTASSIUM CHLORIDE CRYS ER 20 MEQ PO TBCR
20.0000 meq | EXTENDED_RELEASE_TABLET | ORAL | Status: AC
  Administered 2020-12-19: 20 meq via ORAL
  Filled 2020-12-19: qty 1

## 2020-12-19 MED ORDER — QUETIAPINE FUMARATE 200 MG PO TABS: 200.0000 mg | ORAL_TABLET | Freq: Every day | ORAL | Status: DC

## 2020-12-19 MED ORDER — LORAZEPAM 1 MG PO TABS: 1.0000 mg | ORAL_TABLET | Freq: Once | ORAL | Status: DC

## 2020-12-19 MED ORDER — ZIPRASIDONE MESYLATE 20 MG IM SOLR: 10.0000 mg | Freq: Two times a day (BID) | INTRAMUSCULAR | Status: DC | PRN

## 2020-12-19 MED ORDER — LORAZEPAM 2 MG/ML IJ SOLN: 1.0000 mg | Freq: Once | INTRAMUSCULAR | Status: DC

## 2020-12-19 NOTE — Progress Notes (Signed)
Patient declined all morning medications.

## 2020-12-19 NOTE — Progress Notes (Addendum)
Patient continues to refuse medications. Patient continues to interact loudly. Nursing staff will continue to monitor.

## 2020-12-19 NOTE — ED Triage Notes (Signed)
Pt denies SI/HI. Pt states she has been in jail and off her medications Pt worried about her blood pressure being elevated and unsure what meds she was given in jail. Pt refusing blood and states she had it taken yesterday.

## 2020-12-19 NOTE — ED Notes (Signed)
Patient discharged home. Patient refused to get into safe transport car and decided to walk. Patient received all belongings from [facility] locker patient.

## 2020-12-19 NOTE — ED Notes (Signed)
Pt sleeping@this  time. Breathign even and unlabored. Will continue to monitor for safety

## 2020-12-19 NOTE — ED Notes (Signed)
Patient given breakfast.

## 2020-12-19 NOTE — Progress Notes (Signed)
Patient awake, pacing talking loudly to self and interacting with unseen individuals, irritable with staff. Patient's acuity is high as well as overall acuity on the unit, therefore patient placed in flex area separated from other adult patients. Nursing staff will continue to monitor.

## 2020-12-19 NOTE — ED Notes (Signed)
Patient yelling out names and demanding to leave, threatening staff by saying "you better watch out."  Sent Provider a message

## 2020-12-19 NOTE — ED Notes (Signed)
LYNN HENDERSON/MOTHER CALLED TO CHECK ON PATIENT.  [phone].

## 2020-12-19 NOTE — ED Provider Notes (Signed)
FBC/OBS ASAP Discharge Summary  Date and Time: 12/19/2020 1:29 PM  Name: Nicole Reeves  MRN:  [id number]   Discharge Diagnoses:  Final diagnoses:  Substance induced mood disorder ([place])  Involuntary commitment    Subjective: "I'm just looking for some help and getting set up with services to help my mental   Nicole Reeves, 35 y.o., female patient seen face to face by this provider, consulted with Dr. Hampton Abbot; and chart reviewed on 12/19/20.  On evaluation Nicole Reeves reports she came to urgent care seeking help.  Patient doesn't admit to being involuntary committed.  Patient admits to drug use but stating she has been drug free for Reeves week and that she is trying to continue that way.  States she needs to change friends and support "because most of them are the ones I be using drugs with."  Patient denies suicidal/self-harm/homicidal ideations, psychosis, paranoia. During evaluation Nicole Reeves is laying in bed in no acute distress.  She is alert, oriented x 4, calm, cooperative and attentive.  Her mood anxious with congruent with affect.  She has normal speech, and behavior.  Objectively there is no evidence of psychosis/mania or delusional thinking.  Patient is able to converse coherently, goal directed thoughts, no distractibility, or pre-occupation.  She also denies suicidal/self-harm/homicidal ideation, psychosis, and paranoia.  Patient answered question appropriately.     Stay Summary: Nicole Reeves was admitted to [facility] Continuous Assessment unit for Substance induced mood disorder ([place]) and crisis management.  She was treated with the following medications Seroquel, Trazodone, and Vistaril which were tolerated with no adverse reactions.   Nicole Reeves's improvement was monitored by continuous assessment/observation and her report of symptom reduction.  Her emotional and mental status was also monitored by staff.           Nicole Reeves was evaluated for  stability and plans for continued recovery upon discharge.  Nicole Reeves motivation was an integral factor for scheduling further treatment.   The following was addressed as part of her discharge planning and follow up treatment:  Employment, housing, transportation, bed availability, health status, family support, and any pending legal issues were also considered during her during the continuous assessment/observation.  She was offered further treatment options upon discharge including but not limited to Residential, Intensive Outpatient, Outpatient treatment, Rehabilitation services, and resources for shelters and Half-way-house if needed.  Nicole Reeves will follow up with the services as listed below under Follow up Information.    Upon completion of this admission the Nicole Reeves was both mentally and medically stable for discharge denying suicidal/homicidal ideation, auditory/visual/tactile hallucinations, delusional thoughts and paranoia.     After thorough evaluation and review of information currently presented on assessment of Nicole Reeves, there is insufficient findings to indicate patient meets criteria for involuntary commitment or require an inpatient level of care. Nicole Reeves is alert/oriented x 4, organized; mood congruent with affect; and denies suicidal/self-harm/homicidal ideation, psychosis, and paranoia.  At this time She is not significantly impaired, psychotic, or manic on exam.   At this time patient is educated and verbalizes understanding of mental health resources and other crisis services in the community. They are instructed to call 911 and present to the nearest emergency room should they experience any suicidal/homicidal ideation, auditory/visual/hallucinations, or detrimental worsening of their mental health condition. Writer also advised the patient to call the toll free phone on insurance card to assist with identifying in network counselors and  agencies  Total Time spent with patient:  30 minutes  Past Psychiatric History: See below Past Medical History:  Past Medical History:  Diagnosis Date   Anxiety    Colitis 11/2014   acute onset bloody diarrhea. C diff, O&P negative.    Depression    Hepatitis C 04/2016   Left sided ulcerative colitis ([place])    Polysubstance abuse ([place]) 02/24/2014   tox screen + for THC, Barbiturates, amphetimine.    Past Surgical History:  Procedure Laterality Date   FLEXIBLE SIGMOIDOSCOPY N/Reeves 12/10/2014   Procedure: FLEXIBLE SIGMOIDOSCOPY;  Surgeon: Mauri Pole, MD;  Location: [facility];  Service: Endoscopy;  Laterality: N/Reeves;   Family History:  Family History  Problem Relation Age of Onset   ADD / ADHD Sister    Anxiety disorder Sister    Drug abuse Paternal Aunt    Alcohol abuse Maternal Grandfather    Alcohol abuse Maternal Grandmother    Alcohol abuse Paternal Grandfather    Alcohol abuse Paternal Grandmother    Anxiety disorder Paternal Grandmother    Drug abuse Paternal Grandmother    Anxiety disorder Sister    Depression Sister    Colon cancer Neg Hx    Esophageal cancer Neg Hx    Rectal cancer Neg Hx    Stomach cancer Neg Hx    Colon polyps Neg Hx    Family Psychiatric History: See above Social History:  Social History   Substance and Sexual Activity  Alcohol Use Not Currently   Alcohol/week: 0.0 standard drinks     Social History   Substance and Sexual Activity  Drug Use Not Currently   Types: Marijuana, Heroin   Comment: not used since 02/2014    Social History   Socioeconomic History   Marital status: Divorced    Spouse name: Not on file   Number of children: 0   Years of education: 16   Highest education level: Bachelor's degree (e.g., BA, AB, BS)  Occupational History   Occupation: Account Development Rep    Comment: [employer]  Tobacco Use   Smoking status: Former    Packs/day: 0.25    Types: Cigarettes, E-cigarettes   Smokeless tobacco:  Never   Tobacco comments:    Reports desire to quite e-cigarettes   Vaping Use   Vaping Use: Every day  Substance and Sexual Activity   Alcohol use: Not Currently    Alcohol/week: 0.0 standard drinks   Drug use: Not Currently    Types: Marijuana, Heroin    Comment: not used since 02/2014   Sexual activity: Yes    Partners: Female  Other Topics Concern   Not on file  Social History Narrative   3-4 cups of coffee daily   Social Determinants of Health   Financial Resource Strain: Not on file  Food Insecurity: Not on file  Transportation Needs: Not on file  Physical Activity: Not on file  Stress: Not on file  Social Connections: Not on file   SDOH:  SDOH Screenings   Alcohol Screen: Not on file  Depression (PHQ2-9): Not on file  Financial Resource Strain: Not on file  Food Insecurity: Not on file  Housing: Not on file  Physical Activity: Not on file  Social Connections: Not on file  Stress: Not on file  Tobacco Use: Medium Risk   Smoking Tobacco Use: Former   Smokeless Tobacco Use: Never   Passive Exposure: Not on file  Transportation Needs: Not on file    Tobacco Cessation:  N/Reeves, patient does not currently use tobacco  products  Current Medications:  Current Facility-Administered Medications  Medication Dose Route Frequency Provider Last Rate Last Admin   acetaminophen (TYLENOL) tablet 650 mg  650 mg Oral Q6H PRN Nicole Reeves, Nicole A, NP       alum & mag hydroxide-simeth (MAALOX/MYLANTA) 200-200-20 MG/5ML suspension 30 mL  30 mL Oral Q4H PRN Nicole Reeves, Nicole A, NP       hydrOXYzine (ATARAX/VISTARIL) tablet 25 mg  25 mg Oral TID PRN Nicole Reeves, Nicole A, NP       LORazepam (ATIVAN) tablet 1 mg  1 mg Oral Once Nicole Reeves, Nicole A, NP       Or   LORazepam (ATIVAN) injection 1 mg  1 mg Intramuscular Once Nicole Reeves, Nicole A, NP       magnesium hydroxide (MILK OF MAGNESIA) suspension 30 mL  30 mL Oral Daily PRN Nicole Reeves, Nicole A, NP       QUEtiapine (SEROQUEL) tablet 200 mg  200 mg Oral QHS  Nicole Reeves, Nicole A, NP       traZODone (DESYREL) tablet 50 mg  50 mg Oral QHS PRN Nicole Reeves, Nicole A, NP   50 mg at 12/18/20 2056   ziprasidone (GEODON) injection 10 mg  10 mg Intramuscular Q12H PRN Nicole Reeves, Nicole A, NP       Current Outpatient Medications  Medication Sig Dispense Refill   budesonide (ENTOCORT EC) 3 MG 24 hr capsule Take 3-9 mg by mouth See admin instructions. Take three capsules daily for 28 days, then two capsules daily for 14 days, then one capsule daily until finished.     gabapentin (NEURONTIN) 300 MG capsule Take 300 mg by mouth 3 (three) times daily.     hydrOXYzine (ATARAX/VISTARIL) 25 MG tablet Take 1 tablet (25 mg total) by mouth every 6 (six) hours. 12 tablet 0   mesalamine (APRISO) 0.375 g 24 hr capsule Take 4 capsules (1.5 g total) by mouth daily. 120 capsule 11   QUEtiapine (SEROQUEL) 100 MG tablet Take 200 mg by mouth at bedtime.     VYVANSE 60 MG capsule Take 60 mg by mouth every morning.      PTA Medications: (Not in Reeves hospital admission)   Musculoskeletal  Strength & Muscle Tone: within normal limits Gait & Station: normal Patient leans: N/Reeves  Psychiatric Specialty Exam  Presentation  General Appearance: Appropriate for Environment  Eye Contact:Good  Speech:Clear and Coherent; Normal Rate  Speech Volume:Normal  Handedness:Right   Mood and Affect  Mood:Anxious  Affect:Congruent   Thought Process  Thought Processes:Coherent; Goal Directed; Linear  Descriptions of Associations:Intact  Orientation:Full (Time, Place and Person)  Thought Content:WDL     Hallucinations:Hallucinations: None Description of Auditory Hallucinations: "chatters"  Ideas of Reference:None  Suicidal Thoughts:Suicidal Thoughts: No  Homicidal Thoughts:Homicidal Thoughts: No   Sensorium  Memory:Immediate Good; Recent Good  Judgment:Intact  Insight:Present   Executive Functions  Concentration:Fair  Attention Span:Fair  [place]   Psychomotor Activity  Psychomotor Activity:Psychomotor Activity: Normal   Assets  Assets:Communication Skills; Financial Resources/Insurance; Housing; Social Support   Sleep  Sleep:Sleep: Good   Nutritional Assessment (For OBS and FBC admissions only) Has the patient had Reeves weight loss or gain of 10 pounds or more in the last 3 months?: No Has the patient had Reeves decrease in food intake/or appetite?: No Does the patient have dental problems?: No Does the patient have eating habits or behaviors that may be indicators of an eating disorder including binging or inducing vomiting?: No Has the  patient recently lost weight without trying?: 0 Has the patient been eating poorly because of Reeves decreased appetite?: 0 Malnutrition Screening Tool Score: 0   Physical Exam  Physical Exam Vitals and nursing note reviewed. Exam conducted with Reeves chaperone present.  Constitutional:      General: She is not in acute distress.    Appearance: Normal appearance. She is not ill-appearing.  Cardiovascular:     Rate and Rhythm: Normal rate.  Pulmonary:     Effort: Pulmonary effort is normal.  Musculoskeletal:        General: Normal range of motion.     Cervical back: Normal range of motion.  Skin:    General: Skin is warm and dry.  Neurological:     Mental Status: She is alert and oriented to person, place, and time.  Psychiatric:        Attention and Perception: Attention and perception normal. She does not perceive visual hallucinations.        Mood and Affect: Mood and affect normal.        Speech: Speech normal.        Behavior: Behavior normal. Behavior is not agitated, slowed, aggressive or hyperactive. Behavior is cooperative.        Cognition and Memory: Cognition and memory normal.        Judgment: Judgment normal.   Review of Systems  Constitutional: Negative.   HENT: Negative.    Eyes: Negative.   Respiratory: Negative.    Cardiovascular:  Negative.   Gastrointestinal: Negative.   Genitourinary: Negative.   Musculoskeletal: Negative.   Skin: Negative.   Neurological: Negative.   Endo/Heme/Allergies: Negative.   Psychiatric/Behavioral:  Positive for substance abuse (States she has been clean for one week; UDS positive to [facility]). Negative for hallucinations (Denies) and suicidal ideas (Denies). Depression: Stable.The patient does not have insomnia. Nervous/anxious: Stable.  Blood pressure (!) 142/88, pulse 92, temperature 98.2 F (36.8 C), temperature source Oral, resp. rate 18, last menstrual period 12/13/2020, SpO2 98 %. There is no height or weight on file to calculate BMI.  Demographic Factors:  Caucasian  Loss Factors: NA  Historical Factors: Impulsivity  Risk Reduction Factors:   Sense of responsibility to family, Religious beliefs about death, and Positive social support  Continued Clinical Symptoms:  Alcohol/Substance Abuse/Dependencies Previous Psychiatric Diagnoses and Treatments  Cognitive Features That Contribute To Risk:  None    Suicide Risk:  Minimal: No identifiable suicidal ideation.  Patients presenting with no risk factors but with morbid ruminations; may be classified as minimal risk based on the severity of the depressive symptoms  Plan Of Care/Follow-up recommendations:  Activity:  Normal activity  Disposition: No evidence of imminent risk to self or others at present.   Patient does not meet criteria for psychiatric inpatient admission. Supportive therapy provided about ongoing stressors. Discussed crisis plan, support from social network, calling 911, coming to the Emergency Department, and calling Suicide Hotline.   Gayleen Sholtz, NP 12/19/2020, 1:29 PM

## 2020-12-20 LAB — PROLACTIN: Prolactin: 10.6 ng/mL (ref 4.8–23.3)

## 2020-12-22 ENCOUNTER — Ambulatory Visit: Payer: 59 | Admitting: Physician Assistant

## 2020-12-22 ENCOUNTER — Telehealth (HOSPITAL_COMMUNITY): Payer: Self-pay | Admitting: Emergency Medicine

## 2020-12-22 NOTE — BH Assessment (Signed)
Care Management - Follow Up [facility] Discharges   Writer attempted to make contact with patient today and was unsuccessful.  Writer left a HIPPA compliant voice message.

## 2020-12-31 ENCOUNTER — Ambulatory Visit (HOSPITAL_COMMUNITY)
Admission: EM | Admit: 2020-12-31 | Discharge: 2020-12-31 | Disposition: A | Discharge status: Home / Self Care | Payer: No Payment, Other | Attending: Family | Admitting: Family

## 2020-12-31 DIAGNOSIS — F331 Major depressive disorder, recurrent, moderate: Secondary | ICD-10-CM | POA: Insufficient documentation

## 2020-12-31 MED ORDER — HYDROXYZINE HCL 25 MG PO TABS: 25.0000 mg | ORAL_TABLET | Freq: Four times a day (QID) | ORAL | 0 refills | Status: DC

## 2020-12-31 MED ORDER — QUETIAPINE FUMARATE 50 MG PO TABS: 50.0000 mg | ORAL_TABLET | Freq: Every day | ORAL | 0 refills | Status: DC
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
## 2020-12-31 NOTE — Discharge Summary (Signed)
Nicole Reeves to be D/C'd Home per NP order. An After Visit Summary was printed and given to the patient by provider. Patient escorted out, and D/C home via [employer].  Clois Dupes  12/31/2020 12:55 PM
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
## 2020-12-31 NOTE — Discharge Instructions (Addendum)
Take all medications as prescribed. Keep all follow-up appointments as scheduled.  Do not consume alcohol or use illegal drugs while on prescription medications. Report any adverse effects from your medications to your primary care provider promptly.  In the event of recurrent symptoms or worsening symptoms, call 911, a crisis hotline, or go to the nearest emergency department for evaluation.

## 2021-01-05 ENCOUNTER — Telehealth (HOSPITAL_COMMUNITY): Payer: Self-pay | Admitting: Emergency Medicine

## 2021-01-05 NOTE — BH Assessment (Signed)
Care Management - Follow Up [facility] Discharges   Writer attempted to make contact with patient today and was unsuccessful.  Patient does not have a phone number listed in epic.   Per chart review, medications that are managed by Marni Griffon @ [facility].

## 2021-07-25 ENCOUNTER — Other Ambulatory Visit: Payer: Self-pay

## 2021-07-25 ENCOUNTER — Emergency Department (HOSPITAL_COMMUNITY)
Admission: EM | Admit: 2021-07-25 | Discharge: 2021-07-25 | Disposition: A | Discharge status: Home / Self Care | Payer: Self-pay | Attending: Emergency Medicine | Admitting: Emergency Medicine

## 2021-07-25 ENCOUNTER — Encounter (HOSPITAL_COMMUNITY): Payer: Self-pay

## 2021-07-25 ENCOUNTER — Emergency Department (HOSPITAL_COMMUNITY): Payer: Self-pay

## 2021-07-25 DIAGNOSIS — K5190 Ulcerative colitis, unspecified, without complications: Secondary | ICD-10-CM | POA: Insufficient documentation

## 2021-07-25 DIAGNOSIS — N9489 Other specified conditions associated with female genital organs and menstrual cycle: Secondary | ICD-10-CM | POA: Insufficient documentation

## 2021-07-25 LAB — COMPREHENSIVE METABOLIC PANEL
ALT: 10 U/L (ref 0–44)
AST: 11 U/L — ABNORMAL LOW (ref 15–41)
Albumin: 3.4 g/dL — ABNORMAL LOW (ref 3.5–5.0)
Alkaline Phosphatase: 68 U/L (ref 38–126)
Anion gap: 9 (ref 5–15)
BUN: 8 mg/dL (ref 6–20)
CO2: 23 mmol/L (ref 22–32)
Calcium: 8.6 mg/dL — ABNORMAL LOW (ref 8.9–10.3)
Chloride: 107 mmol/L (ref 98–111)
Creatinine, Ser: 0.62 mg/dL (ref 0.44–1.00)
GFR, Estimated: >60 mL/min (ref >60)
Glucose, Bld: 103 mg/dL — ABNORMAL HIGH (ref 70–99)
Potassium: 3.5 mmol/L (ref 3.5–5.1)
Sodium: 139 mmol/L (ref 135–145)
Total Bilirubin: 0.4 mg/dL (ref 0.3–1.2)
Total Protein: 7.5 g/dL (ref 6.5–8.1)

## 2021-07-25 LAB — CBC
HCT: 35.5 % — ABNORMAL LOW (ref 36.0–46.0)
Hemoglobin: 12.8 g/dL (ref 12.0–15.0)
MCH: 32.2 pg (ref 26.0–34.0)
MCHC: 36.1 g/dL — ABNORMAL HIGH (ref 30.0–36.0)
MCV: 89.4 fL (ref 80.0–100.0)
Platelets: 325 10*3/uL (ref 150–400)
RBC: 3.97 MIL/uL (ref 3.87–5.11)
RDW: 12 % (ref 11.5–15.5)
WBC: 9.3 10*3/uL (ref 4.0–10.5)
nRBC: 0 % (ref 0.0–0.2)

## 2021-07-25 LAB — TYPE AND SCREEN
ABO/RH(D): A POS
Antibody Screen: NEGATIVE

## 2021-07-25 LAB — POC OCCULT BLOOD, ED: Fecal Occult Bld: NEGATIVE

## 2021-07-25 LAB — HCG, QUANTITATIVE, PREGNANCY: hCG, Beta Chain, Quant, S: <1 m[IU]/mL (ref <5)

## 2021-07-25 MED ORDER — DICYCLOMINE HCL 10 MG PO CAPS: 10.0000 mg | ORAL_CAPSULE | Freq: Four times a day (QID) | ORAL | 0 refills | Status: DC | PRN

## 2021-07-25 MED ORDER — IOHEXOL 9 MG/ML PO SOLN
ORAL | Status: AC
  Filled 2021-07-25: qty 1000

## 2021-07-25 MED ORDER — SODIUM CHLORIDE (PF) 0.9 % IJ SOLN
INTRAMUSCULAR | Status: AC
  Filled 2021-07-25: qty 50

## 2021-07-25 MED ORDER — MESALAMINE ER 0.375 G PO CP24: 1500.0000 mg | ORAL_CAPSULE | Freq: Every day | ORAL | 11 refills | Status: DC

## 2021-07-25 MED ORDER — IOHEXOL 300 MG/ML  SOLN
100.0000 mL | Freq: Once | INTRAMUSCULAR | Status: AC | PRN
  Administered 2021-07-25: 100 mL via INTRAVENOUS

## 2021-07-25 MED ORDER — IOHEXOL 9 MG/ML PO SOLN
500.0000 mL | ORAL | Status: AC
  Administered 2021-07-25 (×2): 500 mL via ORAL

## 2021-07-25 NOTE — ED Provider Notes (Signed)
[name] DEPT Provider Note   CSN: [id number] Arrival date & time: 07/25/21  7124     History Chief Complaint  Patient presents with   Abdominal Pain   Diarrhea   Blood In Stools    Nicole Reeves is a 36 y.o. female.  Patient presents with 2 week duration of rectal bleeding, she has had this before and says that this feels like when she has a flare-up of her ulcerative colitis. Presents today because her diarrhea episodes are getting worse to the point where it is occurring sometimes 15 episodes a day. Blood is noted within the stool but not when she wipes. Used to follow up with GI but last saw them a year ago when she had her last flare-up, she has not seen them since due to insurance issues. Denies fever, chills, emesis, vaginal bleeding, hematuria, chest pain and dyspnea. Endorses appetite changes, diarrhea, lower abdominal pain and weakness. Denies recent travel history.   The history is provided by the patient. No language interpreter was used.  Abdominal Pain Associated symptoms: diarrhea   Diarrhea Associated symptoms: abdominal pain       Home Medications Prior to Admission medications   Medication Sig Start Date End Date Taking? Authorizing Provider  dicyclomine (BENTYL) 10 MG capsule Take 1 capsule (10 mg total) by mouth every 6 (six) hours as needed for up to 14 days for spasms. Please take as needed for cramping and diarrhea. 07/25/21 08/08/21 Yes Tyrae Alcoser, DO  budesonide (ENTOCORT EC) 3 MG 24 hr capsule Take 3-9 mg by mouth See admin instructions. Take three capsules daily for 28 days, then two capsules daily for 14 days, then one capsule daily until finished.    [provider]  gabapentin (NEURONTIN) 300 MG capsule Take 300 mg by mouth 3 (three) times daily.    [provider]  hydrOXYzine (ATARAX/VISTARIL) 25 MG tablet Take 1 tablet (25 mg total) by mouth every 6 (six) hours. 12/31/20   Derrill Center, NP   mesalamine (APRISO) 0.375 g 24 hr capsule Take 4 capsules (1.5 g total) by mouth daily. 07/25/21   Caelan Branden, DO  QUEtiapine (SEROQUEL) 50 MG tablet Take 1 tablet (50 mg total) by mouth at bedtime. 12/31/20   Derrill Center, NP  VYVANSE 60 MG capsule Take 60 mg by mouth every morning. 10/19/20   [provider]      Allergies    Buspirone    Review of Systems   Review of Systems  Constitutional:  Positive for appetite change.  Gastrointestinal:  Positive for abdominal pain and diarrhea.  All other systems reviewed and are negative.  Physical Exam Updated Vital Signs BP 112/82   Pulse 72   Temp 98.4 F (36.9 C) (Oral)   Resp 16   Ht 5' 7"  (5.809 m)   Wt 70.3 kg   LMP 07/11/2021 (Approximate) Comment: negative quantitative hcg 07-25-2021  SpO2 95%   BMI 24.28 kg/m  Physical Exam Vitals reviewed.  Constitutional:      General: She is not in acute distress.    Appearance: She is well-developed.  HENT:     Head: Normocephalic and atraumatic.     Mouth/Throat:     Mouth: Mucous membranes are moist.     Pharynx: Oropharynx is clear.  Eyes:     General: No scleral icterus.    Extraocular Movements: Extraocular movements intact.     Pupils: Pupils are equal, round, and reactive to light.  Cardiovascular:     Rate and Rhythm: Normal rate and regular rhythm.     Heart sounds: No murmur heard.   No gallop.  Pulmonary:     Effort: Pulmonary effort is normal. No respiratory distress.     Breath sounds: Normal breath sounds. No stridor. No wheezing or rales.  Abdominal:     General: Bowel sounds are normal. There is no distension.     Palpations: Abdomen is soft. There is no splenomegaly.     Tenderness: There is abdominal tenderness in the right lower quadrant and left lower quadrant. There is no rebound. Negative signs include Murphy's sign, McBurney's sign and obturator sign.     Hernia: No hernia is present.  Skin:    General: Skin is warm.     Capillary Refill:  Capillary refill takes less than 2 seconds.     Coloration: Skin is not cyanotic or pale.     Findings: No erythema.  Neurological:     General: No focal deficit present.     Mental Status: She is alert and oriented to person, place, and time.  Psychiatric:        Mood and Affect: Mood normal.        Behavior: Behavior normal.    ED Results / Procedures / Treatments   Labs (all labs ordered are listed, but only abnormal results are displayed) Labs Reviewed  COMPREHENSIVE METABOLIC PANEL - Abnormal; Notable for the following components:      Result Value   Glucose, Bld 103 (*)    Calcium 8.6 (*)    Albumin 3.4 (*)    AST 11 (*)    All other components within normal limits  CBC - Abnormal; Notable for the following components:   HCT 35.5 (*)    MCHC 36.1 (*)    All other components within normal limits  GASTROINTESTINAL PANEL BY PCR, STOOL (REPLACES STOOL CULTURE)  HCG, QUANTITATIVE, PREGNANCY  C-REACTIVE PROTEIN  POC OCCULT BLOOD, ED  I-STAT BETA HCG BLOOD, ED ([facility], [facility], [facility] ONLY)  TYPE AND SCREEN  ABO/RH    EKG None  Radiology CT ABDOMEN PELVIS W CONTRAST  Result Date: 07/25/2021 CLINICAL DATA:  Lower abdominal pain, diarrhea, and hematochezia for 2 weeks. Ulcerative colitis. EXAM: CT ABDOMEN AND PELVIS WITH CONTRAST TECHNIQUE: Multidetector CT imaging of the abdomen and pelvis was performed using the standard protocol following bolus administration of intravenous contrast. RADIATION DOSE REDUCTION: This exam was performed according to the departmental dose-optimization program which includes automated exposure control, adjustment of the mA and/or kV according to patient size and/or use of iterative reconstruction technique. CONTRAST:  179m OMNIPAQUE IOHEXOL 300 MG/ML  SOLN COMPARISON:  12/09/2017 FINDINGS: Lower Chest: No acute findings. Hepatobiliary: No hepatic masses identified. Gallbladder is unremarkable. No evidence of biliary ductal dilatation. Pancreas:  No mass or  inflammatory changes. Spleen: Within normal limits in size and appearance. Adrenals/Urinary Tract: No masses identified. No evidence of ureteral calculi or hydronephrosis. Stomach/Bowel: Mild wall thickening is seen involving the transverse, descending, and sigmoid colon, consistent with colitis. No evidence of perforation or abscess. No evidence of small bowel involvement. Vascular/Lymphatic: No pathologically enlarged lymph nodes. No acute vascular findings. Reproductive:  No mass or other significant abnormality. Other:  None. Musculoskeletal:  No suspicious bone lesions identified. IMPRESSION: Mild colitis involving the transverse, descending, and sigmoid colon. No evidence of perforation, abscess, or other complication. Electronically Signed   By: JMarlaine HindM.D.   On: 07/25/2021 12:11  Procedures Procedures  None  Medications Ordered in ED Medications  iohexol (OMNIPAQUE) 9 MG/ML oral solution (has no administration in time range)  sodium chloride (PF) 0.9 % injection (has no administration in time range)  iohexol (OMNIPAQUE) 9 MG/ML oral solution 500 mL (500 mLs Oral Contrast Given 07/25/21 1048)  iohexol (OMNIPAQUE) 300 MG/ML solution 100 mL (100 mLs Intravenous Contrast Given 07/25/21 1148)    ED Course/ Medical Decision Making/ A&P                           Medical Decision Making Amount and/or Complexity of Data Reviewed Labs: ordered. Radiology: ordered.  Risk Prescription drug management.    Patient with history of ulcerative colitis presents with 2 week duration of rectal bleeding. Exam significant for lower quadrant abdominal pain bilaterally upon deep tenderness but otherwise unremarkable. Workup significant for stable Hgb at 12.8 with negative FOBT. CT abdomen/pelvis ordered. Differentials remain broad including diverticulosis, ulcerative colitis, Crohn's disease and hemorrhoids. Low concern for ectopic pregnancy or STD given lack of pelvic pain or vaginal symptoms.  Reassuringly no systemic symptoms. Rectal bleeding likely secondary to acute on chronic UC. CT abdomen/pelvis demonstrated mild colitis involving the transverse descending and sigmoid colon without evidence of abscess, perforation or other complication. Discussed with on-call GI provider who recommended regimen of bentyl and restarting apriso which were prescribed to patient prior to discharge. Patient did not have further episodes of diarrhea here. Patient medically stable for discharge home, instructed to follow up with Dr. Silverio Decamp at earliest convenience, patient agreeable with plan in place.    Final Clinical Impression(s) / ED Diagnoses Final diagnoses:  Ulcerative colitis, chronic, without complications ([place])    Rx / DC Orders ED Discharge Orders          Ordered    dicyclomine (BENTYL) 10 MG capsule  Every 6 hours PRN        07/25/21 1355    mesalamine (APRISO) 0.375 g 24 hr capsule  Daily        07/25/21 [place], [place], DO 07/25/21 1401    Lacretia Leigh, MD 07/26/21 1455

## 2021-07-25 NOTE — ED Triage Notes (Signed)
Patient reports lower abdominal pain, diarrhea, and bright red blood in her stools that is worsening x 2 weeks. Patient reports a history of colitis.

## 2021-07-25 NOTE — ED Provider Notes (Signed)
I saw and evaluated the patient, reviewed the resident's note and I agree with the findings and plan.  36 year old female with history of ulcerative colitis presents with abdominal pain and bright red blood from rectum x2 weeks but worse the past week.  She is not currently receiving therapy for her ulcerative colitis.  No fever or chills.  On exam, abdomen is soft nontender nondistended.  Will obtain abdominal CT and labs and reassess and likely consult GI   Lacretia Leigh, MD 07/25/21 [id number]

## 2021-07-25 NOTE — Discharge Instructions (Addendum)
You came to the emergency department due to blood in your stool which is likely from an ulcerative colitis flare-up which was confirmed by imaging we did today. Please call to follow up with the GI specialist, Dr. Silverio Decamp, at your earliest convenience. Please take bentyl 10 mg no more than 4 times day and apriso.

## 2021-07-26 ENCOUNTER — Telehealth: Payer: Self-pay

## 2021-07-26 NOTE — Telephone Encounter (Signed)
Called the patient to assist her with scheduling her appointment. No answer. Left her a detailed message. Asked she return the call to schedule the first available appointment with an APP or Dr Silverio Decamp.

## 2021-07-26 NOTE — Telephone Encounter (Signed)
-----   Message from Alfredia Ferguson, PA-C sent at 07/25/2021  2:23 PM EDT ----- Regarding: office appt This pt was in the ER this am - being sent home - known to Dr Silverio Decamp with Ulc colitis - probable exacerbation  Please get her an appt with Dr Silverio Decamp or app first available - jennifer saw her earlier this year so [facility] if possible -  Please call the pt with the appt - as ER has discharged her - thanks

## 2021-07-27 NOTE — Telephone Encounter (Signed)
Patient scheduled for follow up.

## 2021-08-10 ENCOUNTER — Encounter: Payer: Self-pay | Admitting: *Deleted

## 2021-08-16 NOTE — Progress Notes (Signed)
08/16/2021 Nicole Reeves [id number] 1985/09/26   Chief Complaint: Colitis flare  History of Present Illness: Nicole Reeves is a 36 year old female with a past medical history of anxiety, depression, polysubstance abuse, chronic hepatitis C GT3 04/2016 treated with reported SVR and ulcerative colitis.   She was last seen in office by [name] PA-C on 10/20/2020 due having a colitis flare after she stopped taking Apriso and Budesonide due to loss of her health insurance. She was prescribed Budesonide and Apriso with recommendation to follow up in 2 to 3 months which was not done as she remained uninsured. She took Apriso for a few months but did not take Budesonide as it was not affordable.   She presented to [facility] ED on 07/25/2021 with bloody diarrhea and worsening lower abdominal pain x 2 weeks.  Labs in the ED showed a WBC count of 9.3.  Hemoglobin 12.8.  Hematocrit 35.5.  Platelet 325.  BUN 8.  Creatinine 0.62.  Sodium 139.  Potassium 3.5.  Normal LFTs.  CTAP showed mild colitis to the transverse, descending and sigmoid colon without evidence of a perforation or abscess. She was prescribed Apriso 0.356m 4 tabs daily and Bentyl.  She presents today for further colitis follow-up.  She requests a pain medication.  She took Bentyl which worsened her diarrhea and abdominal pain.  She stated her diarrhea started 1-1/2 months ago.  She continues to have small volume watery brown diarrhea with bloody mucus 10-20 times throughout the day and nighttime.  She continues to have lower abdominal pain which is fairly constant and has somewhat worsened since she was seen in the ED 07/25/2021.  No antibiotic use within the past 6 months.  No NSAID use.  No alcohol use.  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  Occasionally smokes marijuana.  No fever, sweats or chills.  She endorses losing at least 10 pounds over the past few months (weight 149lbs on 10/20/2020, today's weight  153lbs). Her most recent colonoscopy was 12/11/2017 which showed chronic colitis to the left colon without dysplasia.  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  No suicidal ideation.      Latest Ref Rng & Units 07/25/2021    9:44 AM 12/18/2020    9:10 PM 12/14/2020    6:08 PM  CBC  WBC 4.0 - 10.5 K/uL 9.3  9.4  7.5   Hemoglobin 12.0 - 15.0 g/dL 12.8  12.8  13.1   Hematocrit 36.0 - 46.0 % 35.5  37.3  38.3   Platelets 150 - 400 K/uL 325  390  328         Latest Ref Rng & Units 07/25/2021    9:44 AM 12/18/2020    9:10 PM 12/14/2020    6:08 PM  CMP  Glucose 70 - 99 mg/dL 103  84  90   BUN 6 - 20 mg/dL 8  11  11    Creatinine 0.44 - 1.00 mg/dL 0.62  0.56  0.51   Sodium 135 - 145 mmol/L 139  138  136   Potassium 3.5 - 5.1 mmol/L 3.5  2.9  3.3   Chloride 98 - 111 mmol/L 107  102  106   CO2 22 - 32 mmol/L 23  26  22    Calcium 8.9 - 10.3 mg/dL 8.6  9.5  9.1   Total Protein 6.5 - 8.1 g/dL 7.5  7.6  6.7   Total Bilirubin 0.3 -  1.2 mg/dL 0.4  0.4  0.4   Alkaline Phos 38 - 126 U/L 68  51  53   AST 15 - 41 U/L 11  28  16    ALT 0 - 44 U/L 10  22  13       CTAP 07/25/2021: FINDINGS: Lower Chest: No acute findings.   Hepatobiliary: No hepatic masses identified. Gallbladder is unremarkable. No evidence of biliary ductal dilatation.   Pancreas:  No mass or inflammatory changes.   Spleen: Within normal limits in size and appearance.   Adrenals/Urinary Tract: No masses identified. No evidence of ureteral calculi or hydronephrosis.   Stomach/Bowel: Mild wall thickening is seen involving the transverse, descending, and sigmoid colon, consistent with colitis. No evidence of perforation or abscess. No evidence of small bowel involvement.   Vascular/Lymphatic: No pathologically enlarged lymph nodes. No acute vascular findings.   Reproductive:  No mass or other significant abnormality.   Other:  None.   Musculoskeletal:  No suspicious bone  lesions identified.   IMPRESSION: Mild colitis involving the transverse, descending, and sigmoid colon. No evidence of perforation, abscess, or other complication.   Colonoscopy 12/11/2017: - Preparation of the colon was fair. - Mild (Mayo Score 1) left-sided ulcerative colitis, in remission since the last examination. - 10 year colonoscopy recall 1. Surgical [P], right colon - CHRONIC COLITIS WITH FOCAL ACTIVITY - NO GRANULOMATA, DYSPLASIA OR MALIGNANCY IDENTIFIED - SEE COMMENT 2. Surgical [P], left colon BX - CHRONIC INACTIVE COLITIS - NO GRANULOMATA, DYSPLASIA OR MALIGNANCY IDENTIFIED Per Dr. Silverio Decamp: Chronic active colitis in left colon (though the report says right colon) I believe the biopsy containers may have been switched or mislabeled.  Colonoscopy 03/14/2015: Active colitis with aphthous ulcers in sigmoid and descending colon  Flexible sigmoidoscopy 12/10/2014: Severe colitis of sigmoid colon with extensive ulceration of the mucosa Relative sparring of rectum with few erosions and erythema. Differential includes inflammatory bowel disease vs less likely CMV colitis  Past Medical History:  Diagnosis Date   Anxiety    Colitis 11/2014   acute onset bloody diarrhea. C diff, O&P negative.    Depression    Hepatitis C 04/2016   Left sided ulcerative colitis ([place])    Major depressive disorder    Polysubstance abuse ([place]) 02/24/2014   tox screen + for THC, Barbiturates, amphetimine.   Past Surgical History:  Procedure Laterality Date   FLEXIBLE SIGMOIDOSCOPY N/A 12/10/2014   Procedure: FLEXIBLE SIGMOIDOSCOPY;  Surgeon: Mauri Pole, MD;  Location: [facility] ENDOSCOPY;  Service: Endoscopy;  Laterality: N/A;   Current Medications, Allergies, Past Medical History, Past Surgical History, Family History and Social History were reviewed in [employer] record.  Review of Systems:   Constitutional: Negative for fever, sweats, chills or weight loss.   Respiratory: Negative for shortness of breath.   Cardiovascular: Negative for chest pain, palpitations and leg swelling.  Gastrointestinal: See HPI.  Musculoskeletal: Negative for back pain or muscle aches.  Neurological: Negative for dizziness, headaches or paresthesias.   Physical Exam: LMP 07/11/2021 (Approximate) Comment: negative quantitative HCG5-30-2023 BP 90/70   Pulse 96   Ht 5' 7"  (1.702 m)   Wt 153 lb (69.4 kg)   LMP 07/11/2021 (Approximate) Comment: negative quantitative HCG5-30-2023  BMI 23.96 kg/m  Wt Readings from Last 3 Encounters:  08/17/21 153 lb (69.4 kg)  07/25/21 155 lb (70.3 kg)  10/20/20 149 lb (67.6 kg)    General: 36 year old female in no acute distress. Head: Normocephalic and atraumatic. Eyes: No scleral icterus.  Conjunctiva pink . Ears: Normal auditory acuity. Mouth: Dentition intact. No ulcers or lesions.  Lungs: Clear throughout to auscultation. Heart: Regular rate and rhythm, no murmur. Abdomen: Soft, nontender and nondistended. No masses or hepatomegaly. Normal bowel sounds x 4 quadrants.  Rectal: Deferred.  Musculoskeletal: Symmetrical with no gross deformities. Extremities: No edema. Neurological: Alert oriented x 4. No focal deficits.  Psychological: Alert and cooperative. Normal mood and affect  Assessment and Recommendations:  33) 36 year old female with a history of left sided ulcerative colitis intermittently treated with Apriso with worsening colitis symptoms for the past 6 weeks including lower abdominal pain, 10 - 20 episodes of small volume diarrhea with bloody mucus throughout the day and night. Seen at the ED 07/25/2021, CTAP showed mild colitis to the transverse, descending and sigmoid colon for which she was prescribed Apriso 0.354m four tabs po bid without significant improvement. Colonoscopy in 2019 showed left colon with chronic colitis. Patient requesting pain med.  -Colitis treatment options are limited as she remains  uninsured, she declines Cipro/Flagyl or any antibiotic to treat colitis, cannot afford Budesonide and does not wish to take Prednisone as it caused facial swelling in  the past. Prednisone may also increase the risk of recurrent psychotic event therefore caution with any future steroid use.  -C.diff PCR, CBC, CMP and CRP and colonoscopy not affordable at this time -Check Hep B surface antigen, Hep B core IgG total, Hep B surface antibody, Hep C RNA quant and Quantiferon Gold (in preparation for possible future biologic therapy) when health insurance or [place] financial assistance obtained  -[place] [place]  -Continue Apriso 0.377mfour tabs po QD for now -Push fluids, bland low fiber diet  -Patient was instructed to go to the ED for hospital admission if she develops worsening abdominal pain and diarrhea -Await further recommendations per Dr. NaSilverio Decamp

## 2021-08-17 ENCOUNTER — Ambulatory Visit (INDEPENDENT_AMBULATORY_CARE_PROVIDER_SITE_OTHER): Payer: Self-pay | Admitting: Nurse Practitioner

## 2021-08-17 ENCOUNTER — Encounter: Payer: Self-pay | Admitting: Nurse Practitioner

## 2021-08-17 VITALS — BP 90/70 | HR 96 | Ht 67.0 in | Wt 153.0 lb

## 2021-08-17 DIAGNOSIS — K5190 Ulcerative colitis, unspecified, without complications: Secondary | ICD-10-CM

## 2021-08-17 MED ORDER — HYOSCYAMINE SULFATE 0.125 MG SL SUBL: 0.1250 mg | SUBLINGUAL_TABLET | Freq: Three times a day (TID) | SUBLINGUAL | 0 refills | Status: AC | PRN

## 2021-08-17 NOTE — Patient Instructions (Addendum)
Push fluids, bland diet  Go to the emergency room if your abdominal pain or diarrhea worsens   Continue Apriso 4 tabs daily   We have sent the following medications to your pharmacy for you to pick up at your convenience: Hyoscyamine  You have been scheduled to see Dr Silverio Decamp in follow up on 10/23/21 at 11:00 am.  Please go to the emergency room should your symptoms worsen.  If you are age 35 or older, your body mass index should be between 23-30. Your Body mass index is 23.96 kg/m. If this is out of the aforementioned range listed, please consider follow up with your Primary Care Provider.  If you are age 18 or younger, your body mass index should be between 19-25. Your Body mass index is 23.96 kg/m. If this is out of the aformentioned range listed, please consider follow up with your Primary Care Provider.   ________________________________________________________  The [facility] providers would like to encourage you to use [facility] to communicate with providers for non-urgent requests or questions.  Due to long hold times on the telephone, sending your provider a message by [facility] may be a faster and more efficient way to get a response.  Please allow 48 business hours for a response.  Please remember that this is for non-urgent requests.  _______________________________________________________  Due to recent changes in healthcare laws, you may see the results of your imaging and laboratory studies on MyChart before your provider has had a chance to review them.  We understand that in some cases there may be results that are confusing or concerning to you. Not all laboratory results come back in the same time frame and the provider may be waiting for multiple results in order to interpret others.  Please give [place] 48 hours in order for your provider to thoroughly review all the results before contacting the office for clarification of your results.

## 2021-10-23 ENCOUNTER — Ambulatory Visit: Payer: Self-pay | Admitting: Gastroenterology

## 2021-11-23 ENCOUNTER — Ambulatory Visit: Payer: Self-pay | Admitting: Nurse Practitioner

## 2021-11-23 NOTE — Progress Notes (Deleted)
     11/23/2021 Nicole Reeves [id number] [birth year]   Chief Complaint:  History of Present Illness: Nicole Reeves is a 36 year old female with a past medical history of anxiety, depression, polysubstance abuse, chronic hepatitis C GT3 04/2016 treated with reported SVR and ulcerative colitis. She is followed by Dr. Silverio Decamp.   CTAP 07/25/2021: FINDINGS: Lower Chest: No acute findings.   Hepatobiliary: No hepatic masses identified. Gallbladder is unremarkable. No evidence of biliary ductal dilatation.   Pancreas:  No mass or inflammatory changes.   Spleen: Within normal limits in size and appearance.   Adrenals/Urinary Tract: No masses identified. No evidence of ureteral calculi or hydronephrosis.   Stomach/Bowel: Mild wall thickening is seen involving the transverse, descending, and sigmoid colon, consistent with colitis. No evidence of perforation or abscess. No evidence of small bowel involvement.   Vascular/Lymphatic: No pathologically enlarged lymph nodes. No acute vascular findings.   Reproductive:  No mass or other significant abnormality.   Other:  None.   Musculoskeletal:  No suspicious bone lesions identified.   IMPRESSION: Mild colitis involving the transverse, descending, and sigmoid colon. No evidence of perforation, abscess, or other complication.   Colonoscopy 12/11/2017: - Preparation of the colon was fair. - Mild (Mayo Score 1) left-sided ulcerative colitis, in remission since the last examination. - 10 year colonoscopy recall 1. Surgical [P], right colon - CHRONIC COLITIS WITH FOCAL ACTIVITY - NO GRANULOMATA, DYSPLASIA OR MALIGNANCY IDENTIFIED - SEE COMMENT 2. Surgical [P], left colon BX - CHRONIC INACTIVE COLITIS - NO GRANULOMATA, DYSPLASIA OR MALIGNANCY IDENTIFIED Per Dr. Silverio Decamp: Chronic active colitis in left colon (though the report says right colon) I believe the biopsy containers may have been switched or mislabeled.   Colonoscopy  03/14/2015: Active colitis with aphthous ulcers in sigmoid and descending colon   Flexible sigmoidoscopy 12/10/2014: Severe colitis of sigmoid colon with extensive ulceration of the mucosa Relative sparring of rectum with few erosions and erythema. Differential includes inflammatory bowel disease vs less likely CMV colitis      Current Medications, Allergies, Past Medical History, Past Surgical History, Family History and Social History were reviewed in [employer] record.   Review of Systems:   Constitutional: Negative for fever, sweats, chills or weight loss.  Respiratory: Negative for shortness of breath.   Cardiovascular: Negative for chest pain, palpitations and leg swelling.  Gastrointestinal: See HPI.  Musculoskeletal: Negative for back pain or muscle aches.  Neurological: Negative for dizziness, headaches or paresthesias.    Physical Exam: There were no vitals taken for this visit. General: Well developed, w   ***female in no acute distress. Head: Normocephalic and atraumatic. Eyes: No scleral icterus. Conjunctiva pink . Ears: Normal auditory acuity. Mouth: Dentition intact. No ulcers or lesions.  Lungs: Clear throughout to auscultation. Heart: Regular rate and rhythm, no murmur. Abdomen: Soft, nontender and nondistended. No masses or hepatomegaly. Normal bowel sounds x 4 quadrants.  Rectal: *** Musculoskeletal: Symmetrical with no gross deformities. Extremities: No edema. Neurological: Alert oriented x 4. No focal deficits.  Psychological: Alert and cooperative. Normal mood and affect  Assessment and Recommendations: ***

## 2022-06-12 NOTE — Progress Notes (Signed)
Reviewed and agree with documentation and assessment and plan. K. Veena Calista Crain , MD   

## 2022-08-22 ENCOUNTER — Other Ambulatory Visit: Payer: Self-pay | Admitting: Family Medicine
